# Patient Record
Sex: Female | Born: 1960 | Race: Black or African American | Hispanic: No | Marital: Married | Smoking: Never smoker
Health system: Southern US, Community
[De-identification: ages and names within clinical notes are randomized; demographics above are authoritative.]

## PROBLEM LIST (undated history)

## (undated) DIAGNOSIS — R062 Wheezing: Secondary | ICD-10-CM

## (undated) DIAGNOSIS — T7840XA Allergy, unspecified, initial encounter: Secondary | ICD-10-CM

## (undated) DIAGNOSIS — M1990 Unspecified osteoarthritis, unspecified site: Secondary | ICD-10-CM

## (undated) DIAGNOSIS — E119 Type 2 diabetes mellitus without complications: Secondary | ICD-10-CM

## (undated) DIAGNOSIS — E785 Hyperlipidemia, unspecified: Secondary | ICD-10-CM

## (undated) DIAGNOSIS — I10 Essential (primary) hypertension: Secondary | ICD-10-CM

## (undated) DIAGNOSIS — M797 Fibromyalgia: Secondary | ICD-10-CM

## (undated) DIAGNOSIS — K219 Gastro-esophageal reflux disease without esophagitis: Secondary | ICD-10-CM

## (undated) HISTORY — DX: Hyperlipidemia, unspecified: E78.5

## (undated) HISTORY — DX: Unspecified osteoarthritis, unspecified site: M19.90

## (undated) HISTORY — DX: Allergy, unspecified, initial encounter: T78.40XA

## (undated) HISTORY — DX: Wheezing: R06.2

## (undated) HISTORY — DX: Fibromyalgia: M79.7

## (undated) HISTORY — PX: POLYPECTOMY: SHX149

## (undated) HISTORY — DX: Gastro-esophageal reflux disease without esophagitis: K21.9

## (undated) HISTORY — PX: TONSILLECTOMY AND ADENOIDECTOMY: SHX28

## (undated) HISTORY — DX: Essential (primary) hypertension: I10

---

## 1998-10-23 ENCOUNTER — Encounter (INDEPENDENT_AMBULATORY_CARE_PROVIDER_SITE_OTHER): Payer: Self-pay | Admitting: *Deleted

## 1998-10-23 ENCOUNTER — Other Ambulatory Visit: Admission: RE | Admit: 1998-10-23 | Discharge: 1998-10-23 | Payer: Self-pay | Admitting: Gynecology

## 1998-11-18 ENCOUNTER — Encounter: Payer: Self-pay | Admitting: Family Medicine

## 1998-11-18 ENCOUNTER — Ambulatory Visit (HOSPITAL_COMMUNITY): Admission: RE | Admit: 1998-11-18 | Discharge: 1998-11-18 | Payer: Self-pay | Admitting: Family Medicine

## 1999-06-03 ENCOUNTER — Encounter: Payer: Self-pay | Admitting: Gynecology

## 1999-06-03 ENCOUNTER — Encounter: Admission: RE | Admit: 1999-06-03 | Discharge: 1999-06-03 | Payer: Self-pay | Admitting: Gynecology

## 1999-06-13 ENCOUNTER — Encounter: Admission: RE | Admit: 1999-06-13 | Discharge: 1999-06-13 | Payer: Self-pay | Admitting: Gynecology

## 1999-06-13 ENCOUNTER — Encounter: Payer: Self-pay | Admitting: Gynecology

## 2000-03-03 ENCOUNTER — Other Ambulatory Visit: Admission: RE | Admit: 2000-03-03 | Discharge: 2000-03-03 | Payer: Self-pay | Admitting: Gynecology

## 2000-08-12 ENCOUNTER — Encounter: Payer: Self-pay | Admitting: Gynecology

## 2000-08-12 ENCOUNTER — Encounter: Admission: RE | Admit: 2000-08-12 | Discharge: 2000-08-12 | Payer: Self-pay | Admitting: Gynecology

## 2001-11-16 ENCOUNTER — Encounter: Admission: RE | Admit: 2001-11-16 | Discharge: 2001-11-16 | Payer: Self-pay | Admitting: Internal Medicine

## 2001-11-16 ENCOUNTER — Encounter: Payer: Self-pay | Admitting: Internal Medicine

## 2002-11-21 ENCOUNTER — Encounter: Admission: RE | Admit: 2002-11-21 | Discharge: 2002-11-21 | Payer: Self-pay | Admitting: Internal Medicine

## 2002-11-21 ENCOUNTER — Encounter: Payer: Self-pay | Admitting: Internal Medicine

## 2003-05-24 ENCOUNTER — Other Ambulatory Visit: Admission: RE | Admit: 2003-05-24 | Discharge: 2003-05-24 | Payer: Self-pay | Admitting: Gynecology

## 2003-11-22 ENCOUNTER — Encounter: Admission: RE | Admit: 2003-11-22 | Discharge: 2003-11-22 | Payer: Self-pay | Admitting: Gynecology

## 2004-04-20 HISTORY — PX: COLONOSCOPY W/ POLYPECTOMY: SHX1380

## 2004-08-05 ENCOUNTER — Other Ambulatory Visit: Admission: RE | Admit: 2004-08-05 | Discharge: 2004-08-05 | Payer: Self-pay | Admitting: Gynecology

## 2005-01-21 ENCOUNTER — Ambulatory Visit: Payer: Self-pay | Admitting: Internal Medicine

## 2005-01-27 ENCOUNTER — Encounter: Admission: RE | Admit: 2005-01-27 | Discharge: 2005-01-27 | Payer: Self-pay | Admitting: Internal Medicine

## 2005-01-30 ENCOUNTER — Ambulatory Visit: Payer: Self-pay | Admitting: Internal Medicine

## 2005-01-30 ENCOUNTER — Encounter (INDEPENDENT_AMBULATORY_CARE_PROVIDER_SITE_OTHER): Payer: Self-pay | Admitting: Specialist

## 2005-03-09 ENCOUNTER — Ambulatory Visit: Payer: Self-pay | Admitting: Internal Medicine

## 2005-08-11 ENCOUNTER — Other Ambulatory Visit: Admission: RE | Admit: 2005-08-11 | Discharge: 2005-08-11 | Payer: Self-pay | Admitting: Gynecology

## 2006-02-02 ENCOUNTER — Encounter: Admission: RE | Admit: 2006-02-02 | Discharge: 2006-02-02 | Payer: Self-pay | Admitting: Gynecology

## 2006-02-19 ENCOUNTER — Encounter: Admission: RE | Admit: 2006-02-19 | Discharge: 2006-05-20 | Payer: Self-pay | Admitting: Gynecology

## 2006-08-17 ENCOUNTER — Other Ambulatory Visit: Admission: RE | Admit: 2006-08-17 | Discharge: 2006-08-17 | Payer: Self-pay | Admitting: Gynecology

## 2007-02-10 ENCOUNTER — Encounter: Admission: RE | Admit: 2007-02-10 | Discharge: 2007-02-10 | Payer: Self-pay | Admitting: Gynecology

## 2007-02-22 ENCOUNTER — Other Ambulatory Visit: Admission: RE | Admit: 2007-02-22 | Discharge: 2007-02-22 | Payer: Self-pay | Admitting: Gynecology

## 2007-08-30 ENCOUNTER — Other Ambulatory Visit: Admission: RE | Admit: 2007-08-30 | Discharge: 2007-08-30 | Payer: Self-pay | Admitting: Gynecology

## 2007-08-30 ENCOUNTER — Encounter (INDEPENDENT_AMBULATORY_CARE_PROVIDER_SITE_OTHER): Payer: Self-pay | Admitting: *Deleted

## 2007-08-30 ENCOUNTER — Encounter: Payer: Self-pay | Admitting: Family Medicine

## 2007-08-30 LAB — CONVERTED CEMR LAB
Cholesterol: 205 mg/dL
HDL: 64 mg/dL
LDL Cholesterol: 126 mg/dL
Triglycerides: 77 mg/dL

## 2008-02-14 ENCOUNTER — Encounter: Admission: RE | Admit: 2008-02-14 | Discharge: 2008-02-14 | Payer: Self-pay | Admitting: Gynecology

## 2008-05-10 ENCOUNTER — Encounter: Admission: RE | Admit: 2008-05-10 | Discharge: 2008-05-10 | Payer: Self-pay | Admitting: Interventional Radiology

## 2008-07-23 ENCOUNTER — Emergency Department (HOSPITAL_COMMUNITY): Admission: EM | Admit: 2008-07-23 | Discharge: 2008-07-24 | Payer: Self-pay | Admitting: Emergency Medicine

## 2008-07-26 ENCOUNTER — Ambulatory Visit: Payer: Self-pay | Admitting: Family Medicine

## 2008-07-26 DIAGNOSIS — M797 Fibromyalgia: Secondary | ICD-10-CM | POA: Insufficient documentation

## 2008-07-26 DIAGNOSIS — R209 Unspecified disturbances of skin sensation: Secondary | ICD-10-CM | POA: Insufficient documentation

## 2008-07-26 DIAGNOSIS — R55 Syncope and collapse: Secondary | ICD-10-CM | POA: Insufficient documentation

## 2008-07-30 ENCOUNTER — Encounter (INDEPENDENT_AMBULATORY_CARE_PROVIDER_SITE_OTHER): Payer: Self-pay | Admitting: *Deleted

## 2008-07-30 LAB — CONVERTED CEMR LAB
Folate: 18.4 ng/mL
HCT: 38.2 % (ref 36.0–46.0)
Hemoglobin: 13 g/dL (ref 12.0–15.0)
MCHC: 34.2 g/dL (ref 30.0–36.0)
MCV: 87.1 fL (ref 78.0–100.0)
Platelets: 309 10*3/uL (ref 150.0–400.0)
RBC: 4.39 M/uL (ref 3.87–5.11)
RDW: 13 % (ref 11.5–14.6)
TSH: 1.13 microintl units/mL (ref 0.35–5.50)
Vitamin B-12: 1067 pg/mL — ABNORMAL HIGH (ref 211–911)
WBC: 6.5 10*3/uL (ref 4.5–10.5)

## 2008-08-01 ENCOUNTER — Encounter (INDEPENDENT_AMBULATORY_CARE_PROVIDER_SITE_OTHER): Payer: Self-pay | Admitting: *Deleted

## 2008-08-10 ENCOUNTER — Encounter (INDEPENDENT_AMBULATORY_CARE_PROVIDER_SITE_OTHER): Payer: Self-pay | Admitting: *Deleted

## 2008-09-04 ENCOUNTER — Encounter: Payer: Self-pay | Admitting: Gynecology

## 2008-09-04 ENCOUNTER — Other Ambulatory Visit: Admission: RE | Admit: 2008-09-04 | Discharge: 2008-09-04 | Payer: Self-pay | Admitting: Gynecology

## 2008-09-04 ENCOUNTER — Encounter: Payer: Self-pay | Admitting: Family Medicine

## 2008-09-04 ENCOUNTER — Ambulatory Visit: Payer: Self-pay | Admitting: Gynecology

## 2008-09-07 ENCOUNTER — Encounter (INDEPENDENT_AMBULATORY_CARE_PROVIDER_SITE_OTHER): Payer: Self-pay | Admitting: *Deleted

## 2008-09-10 ENCOUNTER — Encounter: Payer: Self-pay | Admitting: Family Medicine

## 2008-09-13 ENCOUNTER — Ambulatory Visit: Payer: Self-pay | Admitting: Gynecology

## 2009-02-14 ENCOUNTER — Encounter: Admission: RE | Admit: 2009-02-14 | Discharge: 2009-02-14 | Payer: Self-pay | Admitting: Family Medicine

## 2009-09-19 ENCOUNTER — Ambulatory Visit: Payer: Self-pay | Admitting: Gynecology

## 2009-09-19 ENCOUNTER — Other Ambulatory Visit: Admission: RE | Admit: 2009-09-19 | Discharge: 2009-09-19 | Payer: Self-pay | Admitting: Gynecology

## 2009-09-26 ENCOUNTER — Ambulatory Visit: Payer: Self-pay | Admitting: Gynecology

## 2009-11-05 ENCOUNTER — Ambulatory Visit: Payer: Self-pay | Admitting: Gynecology

## 2010-01-06 ENCOUNTER — Encounter (INDEPENDENT_AMBULATORY_CARE_PROVIDER_SITE_OTHER): Payer: Self-pay | Admitting: *Deleted

## 2010-01-21 ENCOUNTER — Encounter: Admission: RE | Admit: 2010-01-21 | Discharge: 2010-01-21 | Payer: Self-pay | Admitting: Orthopedic Surgery

## 2010-02-17 ENCOUNTER — Encounter: Admission: RE | Admit: 2010-02-17 | Discharge: 2010-02-17 | Payer: Self-pay | Admitting: Gynecology

## 2010-02-17 ENCOUNTER — Encounter (INDEPENDENT_AMBULATORY_CARE_PROVIDER_SITE_OTHER): Payer: Self-pay | Admitting: *Deleted

## 2010-02-20 ENCOUNTER — Ambulatory Visit: Payer: Self-pay | Admitting: Internal Medicine

## 2010-03-04 ENCOUNTER — Ambulatory Visit: Payer: Self-pay | Admitting: Family Medicine

## 2010-03-04 DIAGNOSIS — J019 Acute sinusitis, unspecified: Secondary | ICD-10-CM | POA: Insufficient documentation

## 2010-03-06 ENCOUNTER — Ambulatory Visit: Payer: Self-pay | Admitting: Internal Medicine

## 2010-03-07 ENCOUNTER — Encounter: Payer: Self-pay | Admitting: Internal Medicine

## 2010-05-20 NOTE — Miscellaneous (Signed)
Summary: [facility] previsit  Clinical Lists Changes  Medications: Added new medication of DULCOLAX 5 MG  TBEC (BISACODYL) Day before procedure take 2 at 3pm and 2 at 8pm. - Signed Added new medication of METOCLOPRAMIDE HCL 10 MG  TABS (METOCLOPRAMIDE HCL) As per prep instructions. - Signed Added new medication of MIRALAX   POWD (POLYETHYLENE GLYCOL 3350) As per prep  instructions. - Signed Rx of DULCOLAX 5 MG  TBEC (BISACODYL) Day before procedure take 2 at 3pm and 2 at 8pm.;  #4 x 0;  Signed;  Entered by: Karl Bales RN;  Authorized by: Hart Carwin MD;  Method used: Electronically to [facility]  [address] [id number] [address] [place], [place]  [address], Ph: [phone] or [phone], Fax: [phone] Rx of METOCLOPRAMIDE HCL 10 MG  TABS (METOCLOPRAMIDE HCL) As per prep instructions.;  #2 x 0;  Signed;  Entered by: Karl Bales RN;  Authorized by: Hart Carwin MD;  Method used: Electronically to [facility]  [address] [id number] [address] [place], [place]  [address], Ph: [phone] or [phone], Fax: [phone] Rx of MIRALAX   POWD (POLYETHYLENE GLYCOL 3350) As per prep  instructions.;  #255gm x 0;  Signed;  Entered by: Karl Bales RN;  Authorized by: Hart Carwin MD;  Method used: Electronically to [facility]  [address] [facility] [address] [place], [place]  [address], Ph: [phone] or [phone], Fax: [phone] Observations: Added new observation of NKA: T (02/20/2010 8:03)    Prescriptions: MIRALAX   POWD (POLYETHYLENE GLYCOL 3350) As per prep  instructions.  #255gm x 0   Entered by:   Karl Bales RN   Authorized by:   Hart Carwin MD   Signed by:   Karl Bales RN on 02/20/2010   Method used:   [facility]ronically to        [facility] [id number] ([facility]ail)       [address]       [place], [place]  [address]       Ph: [phone] or [phone]       Fax: [phone]   RxID:    [id number] METOCLOPRAMIDE HCL 10 MG  TABS (METOCLOPRAMIDE HCL) As per prep instructions.  #2 x 0   Entered by:   Karl Bales RN   Authorized by:   Hart Carwin MD   Signed by:   Karl Bales RN on 02/20/2010   Method used:   [facility]ronically to        [facility] [address]       [address]       [place], [place]  [address]       Ph: [phone] or [phone]       Fax: [phone]   RxID:   [id number] DULCOLAX 5 MG  TBEC (BISACODYL) Day before procedure take 2 at 3pm and 2 at 8pm.  #4 x 0   Entered by:   Karl Bales RN   Authorized by:   Hart Carwin MD   Signed by:   Karl Bales RN on 02/20/2010   Method used:   [facility]ronically to        [facility]       [address]       [place], [place]  [address]  Ph: [phone] or [phone]       Fax: [phone]   RxID:   [id number]

## 2010-05-20 NOTE — Letter (Signed)
Summary: Results Follow up Letter  [facility]  [address] [place], [place] [address]   Phone: [phone]  Fax: [phone]    07/30/2008 MRN: [id number]  Courtney Guzman [address] [place], [place]  [address]  Dear [name],  The following are the results of your recent test(s):  Test         Result    Pap Smear:        Normal _____  Not Normal _____ Comments: ______________________________________________________ Cholesterol: LDL(Bad cholesterol):         Your goal is less than:         HDL (Good cholesterol):       Your goal is more than: Comments:  ______________________________________________________ Mammogram:        Normal _____  Not Normal _____ Comments:  ___________________________________________________________________ Hemoccult:        Normal _____  Not normal _______ Comments:    _____________________________________________________________________ Other Tests: PLEASE SEE COPY OF LABS FROM 07/26/08- labs normal- no obvious cause for numbness/tingling.    We routinely do not discuss normal results over the telephone.  If you desire a copy of the results, or you have any questions about this information we can discuss them at your next office visit.   Sincerely,

## 2010-05-20 NOTE — Letter (Signed)
Summary: [facility] Instructions  [facility]  [address] [place], [place] [address]   Phone: [phone]  Fax: [phone]       Courtney Guzman    [birth year]    MRN: [id number]       Procedure Day /Date:  Thursday 03/06/2010     Arrival Time: 7:30 am     Procedure Time: 8:30 am     Location of Procedure:                    _x_  [facility] (4th Floor)   PREPARATION FOR COLONOSCOPY WITH MIRALAX  Starting 5 days prior to your procedure Saturday 11/12 do not eat nuts, seeds, popcorn, corn, beans, peas,  salads, or any raw vegetables.  Do not take any fiber supplements (e.g. Metamucil, Citrucel, and Benefiber). ____________________________________________________________________________________________________   THE DAY BEFORE YOUR PROCEDURE         DATE: Wednesday 11/16  1   Drink clear liquids the entire day-NO SOLID FOOD  2   Do not drink anything colored red or purple.  Avoid juices with pulp.  No orange juice.  3   Drink at least 64 oz. (8 glasses) of fluid/clear liquids during the day to prevent dehydration and help the prep work efficiently.  CLEAR LIQUIDS INCLUDE: Water Jello Ice Popsicles Tea (sugar ok, no milk/cream) Powdered fruit flavored drinks Coffee (sugar ok, no milk/cream) Gatorade Juice: apple, white grape, white cranberry  Lemonade Clear bullion, consomm, broth Carbonated beverages (any kind) Strained chicken noodle soup Hard Candy  4   Mix the entire bottle of Miralax with 64 oz. of Gatorade/Powerade in the morning and put in the refrigerator to chill.  5   At 3:00 pm take 2 Dulcolax/Bisacodyl tablets.  6   At 4:30 pm take one Reglan/Metoclopramide tablet.  7  Starting at 5:00 pm drink one 8 oz glass of the Miralax mixture every 15-20 minutes until you have finished drinking the entire 64 oz.  You should finish drinking prep around 7:30 or 8:00 pm.  8   If you are nauseated, you may take the 2nd Reglan/Metoclopramide  tablet at 6:30 pm.        9    At 8:00 pm take 2 more DULCOLAX/Bisacodyl tablets.     THE DAY OF YOUR PROCEDURE      DATE:  Thursday 11/17  You may drink clear liquids until 6:30 am  (2 HOURS BEFORE PROCEDURE).   MEDICATION INSTRUCTIONS  Unless otherwise instructed, you should take regular prescription medications with a small sip of water as early as possible the morning of your procedure.           OTHER INSTRUCTIONS  You will need a responsible adult at least 51 years of age to accompany you and drive you home.   This person must remain in the waiting room during your procedure.  Wear loose fitting clothing that is easily removed.  Leave jewelry and other valuables at home.  However, you may wish to bring a book to read or an iPod/MP3 player to listen to music as you wait for your procedure to start.  Remove all body piercing jewelry and leave at home.  Total time from sign-in until discharge is approximately 2-3 hours.  You should go home directly after your procedure and rest.  You can resume normal activities the day after your procedure.  The day of your procedure you should not:   Drive   Make  legal decisions   Operate machinery   Drink alcohol   Return to work  You will receive specific instructions about eating, activities and medications before you leave.   The above instructions have been reviewed and explained to me by  Karl Bales RN  February 20, 2010 8:24 AM     I fully understand and can verbalize these instructions _____________________________ Date _______

## 2010-05-20 NOTE — Letter (Signed)
Summary: Historic Patient File  Historic Patient File   Imported By: Freddy Jaksch 09/10/2008 12:30:28  _____________________________________________________________________  External Attachment:    Type:   Image     Comment:   HIPPA

## 2010-05-20 NOTE — Procedures (Signed)
Summary: Colonoscopy  Patient: Courtney Guzman Note: All result statuses are Final unless otherwise noted.  Tests: (1) Colonoscopy (COL)   COL Colonoscopy           DONE (C)     [facility]     [address]     [place], [place]  [address]           COLONOSCOPY PROCEDURE REPORT           PATIENT:  Courtney, Guzman  MR#:  [id number]     BIRTHDATE:  1960-08-30, 49 yrs. old  GENDER:  female     ENDOSCOPIST:  Hedwig Morton. Juanda Chance, MD     REF. BY:  Helane Rima. Beverely Low, M.D.     PROCEDURE DATE:  03/06/2010     PROCEDURE:  Colonoscopy 19147     ASA CLASS:  Class I     INDICATIONS:  history of pre-cancerous (adenomatous) colon polyps     aden. polyp 2006, F hx of colon Ca in a GParent     MEDICATIONS:   Versed 10 mg, Fentanyl 100 mcg           DESCRIPTION OF PROCEDURE:   After the risks benefits and     alternatives of the procedure were thoroughly explained, informed     consent was obtained.  Digital rectal exam was performed and     revealed no rectal masses.   The LB 180AL [id number] endoscope was     introduced through the anus and advanced to the cecum, which was     identified by both the appendix and ileocecal valve, without     limitations.  The quality of the prep was adequate, using MiraLax.     The instrument was then slowly withdrawn as the colon was fully     examined.     <<PROCEDUREIMAGES>>           FINDINGS:  A diminutive polyp was found. 2 mm sessile polyp in the     right colon The polyp was removed using cold biopsy forceps (see     image5 and image6).  This was otherwise a normal examination of     the colon (see image8, image4, image3, image2, and image1).     Retroflexed views in the rectum revealed no abnormalities.    The     scope was then withdrawn from the patient and the procedure     completed.           COMPLICATIONS:  None     ENDOSCOPIC IMPRESSION:     1) Diminutive polyp     2) Otherwise normal examination     RECOMMENDATIONS:     1) Await  pathology results     REPEAT EXAM:  In 10 year(s) for.           ______________________________     Hedwig Morton. Juanda Chance, MD           CC:           n.     REVISED:  03/06/2010 08:49 AM     eSIGNED:   Hedwig Morton. Brodie at 03/06/2010 08:49 AM           Lynelle Doctor, [id number]  Note: An exclamation mark (!) indicates a result that was not dispersed into the flowsheet. Document Creation Date: 03/06/2010 8:49 AM _______________________________________________________________________  (1) Order result status: Final Collection or observation date-time: 03/06/2010 08:33 Requested date-time:  Receipt date-time:  Reported date-time:  Referring Physician:   Ordering Physician: Lina Sar [id number]) Specimen Source:  Source: Launa Grill Order Number: [id number] Lab site:   Appended Document: Colonoscopy     Procedures Next Due Date:    Colonoscopy: 03/2015

## 2010-05-20 NOTE — Letter (Signed)
Summary: Primary Care Consult Scheduled Letter  [facility]  [address] [place], [place] [address]   Phone: [phone]  Fax: [phone]      08/10/2008 MRN: [id number]  KIYARA BOUFFARD [address] [place], [place]  [address]    Dear [name],      We have scheduled an appointment for you.  At the recommendation of Dr. Neena Rhymes, we have scheduled you a consult with Dr. Anne Hahn with [facility] on 09-13-08 at 10:00am.  Their address is [address], [place] [address]. The office phone number is [phone].  If this appointment day and time is not convenient for you, please feel free to call the office of the doctor you are being referred to at the number listed above and reschedule the appointment.     It is important for you to keep your scheduled appointments. We are here to make sure you are given good patient care. If you have questions or you have made changes to your appointment, please notify us at  [phone], ask for Renee.    Thank you,  Patient Care Coordinator [place] [facility]

## 2010-05-20 NOTE — Letter (Signed)
Summary: Pre Visit Letter Revised  [facility]  [address] [place], [place] [address]   Phone: [phone]  Fax: [phone]        01/06/2010 MRN: [id number] Courtney Guzman [address] [place], [place]  [address]             Procedure Date:  03-06-10   Welcome to the Gastroenterology Division at [facility].    You are scheduled to see a nurse for your pre-procedure visit on 02-20-10 at 8:00a.m. on the 3rd floor at [facility], [address]  We ask that you try to arrive at our office 15 minutes prior to your appointment time to allow for check-in.  Please take a minute to review the attached form.  If you answer "Yes" to one or more of the questions on the first page, we ask that you call the person listed at your earliest opportunity.  If you answer "No" to all of the questions, please complete the rest of the form and bring it to your appointment.    Your nurse visit will consist of discussing your medical and surgical history, your immediate family medical history, and your medications.   If you are unable to list all of your medications on the form, please bring the medication bottles to your appointment and we will list them.  We will need to be aware of both prescribed and over the counter drugs.  We will need to know exact dosage information as well.    Please be prepared to read and sign documents such as consent forms, a financial agreement, and acknowledgement forms.  If necessary, and with your consent, a friend or relative is welcome to sit-in on the nurse visit with you.  Please bring your insurance card so that we may make a copy of it.  If your insurance requires a referral to see a specialist, please bring your referral form from your primary care physician.  No co-pay is required for this nurse visit.     If you cannot keep your appointment, please call [phone] to cancel or reschedule prior to your appointment date.  This allows  [place] the opportunity to schedule an appointment for another patient in need of care.    Thank you for choosing [facility] for your medical needs.  We appreciate the opportunity to care for you.  Please visit [place] at our website  to learn more about our practice.  Sincerely, The Gastroenterology Division

## 2010-05-20 NOTE — Assessment & Plan Note (Signed)
Summary: new to establish hosp follow-up vertigo//fd   Vital Signs:  Patient profile:   50 year old female Height:      60.75 inches Weight:      166.4 pounds BMI:     31.81 Pulse rate:   72 / minute Resp:     16 per minute BP sitting:   116 / 74  (left arm)  Vitals Entered By: Doristine Devoid (July 26, 2008 3:37 PM) CC: hosp f/u up for dizziness    History of Present Illness: 50 yo woman here today to establish care.  hasn't seen PCP in 3-4 yrs.  follows w/ GYN regularly, eye doctor regularly.  on Monday 4/5 at 9:30pm pt 'went totally blank for 10-15 minutes, lost my train of thought.  tried to stand up- fell back down'.  pt also reports dizziness, nausea.  pt had worked out, took [employer], had not eaten anything except for few peanuts that day.  went to ER.  they evaluated pt and they suggested pt f/u w/ neurologist.  pt needs PCP in order to get referral.  pt w/ burning in R arm and leg.  pt has discussed sxs w/ Dr Corliss Skains (fibro) in 2/10- MRI of head WNL, offered neuro referral- pt declined.  pt reports burning is constant w/ intermittant flares.  sxs present for 2+ yrs.  pt currently on neurontin- 4 months- no relief.    Preventive Screening-Counseling & Management     Alcohol drinks/day: 0     Smoking Status: never     Does Patient Exercise: yes     Type of exercise: aerobics, walking, jogging     Exercise (avg: min/session): 30-60     Times/week: 4      Sexual History:  currently monogamous.    Current Medications (verified): 1)  Gabapentin 100 Mg Caps (Gabapentin) .... Take One Tablet Two Times A Day 2)  Topamax 25 Mg Tabs (Topiramate) .... Take One Tablet Daily 3)  Cyclobenzaprine  Allergies (verified): No Known Drug Allergies  Past History:  Past Medical History:    Fibromyalgia  Past Surgical History:    Caesarean section    Tubal ligation    Hand surgery    Foot surgery  Past History:  Care Management:    Gynecology: Dr. Lily Peer    Rheumatology:  Dr. Corliss Skains    Gastroenterology: Dr. Juanda Chance  Family History:    CAD-no    HTN-no    DM-sister, paternal grandmother    STROKE-no    COLON CA-maternal grandmother    BREAST CA-no      Social History:    lives w/ husband and daughter.  one daughter is grown and in [employer].    Smoking Status:  never    Does Patient Exercise:  yes    Sexual History:  currently monogamous  Review of Systems General:  Complains of fatigue; denies chills and fever. Eyes:  Denies blurring and double vision. ENT:  Denies ringing in ears. CV:  Complains of palpitations; denies chest pain or discomfort, lightheadness, and shortness of breath with exertion. GI:  Denies nausea and vomiting. Neuro:  Complains of numbness and tingling; denies headaches and tremors.  Physical Exam  General:  Well-developed,well-nourished,in no acute distress; alert,appropriate and cooperative throughout examination Head:  Normocephalic and atraumatic without obvious abnormalities. No apparent alopecia or balding. Eyes:  No corneal or conjunctival inflammation noted. EOMI. Perrla. Funduscopic exam benign, without hemorrhages, exudates or papilledema. Vision grossly normal. Ears:  External ear exam  shows no significant lesions or deformities.  Otoscopic examination reveals clear canals, tympanic membranes are intact bilaterally without bulging, retraction, inflammation or discharge. Hearing is grossly normal bilaterally. Mouth:  Oral mucosa and oropharynx without lesions or exudates.  Teeth in good repair. Neck:  No deformities, masses, or tenderness noted. Lungs:  Normal respiratory effort, chest expands symmetrically. Lungs are clear to auscultation, no crackles or wheezes. Heart:  Normal rate and regular rhythm. S1 and S2 normal without gallop, murmur, click, rub or other extra sounds. Neurologic:  alert & oriented X3, cranial nerves II-XII intact, strength normal in all extremities, gait normal, DTRs symmetrical and normal,  finger-to-nose normal, and Romberg negative.  normal tandem gait. Cervical Nodes:  No lymphadenopathy noted Psych:  Cognition and judgment appear intact. Alert and cooperative with normal attention span and concentration. No apparent delusions, illusions, hallucinations   Impression & Recommendations:  Problem # 1:  SYNCOPE (ICD-780.2) Assessment New pt w/ episode of syncope/near syncope on Monday 4/5.  likely multifactorial- low blood sugar from not eating, s/p exercise and hot shower w/ vasodilating effects and then rapid change in position when pt attempted to stand.  likely a vagal episode.  pt w/ normal head CT in ER, labs.  reviewed discharge summary.  normal MRI w/ Dr Corliss Skains.  pt was requesting neuro referral for paresthesias anyway but will now refer for both.  reviewed supportive care and red flags that should prompt return.  Pt expresses understanding and is in agreement w/ this plan. Orders: Neurology Referral (Neuro)  Problem # 2:  PARESTHESIA (ICD-782.0) Assessment: New pt w/ ongoing paresthesias.  will do labs to determine if underlying cause.  will refer to neuro as pt had discussed w/ Dr Loni Beckwith in past. Orders: Venipuncture (32440) TLB-CBC Platelet - w/Differential (85025-CBCD) TLB-B12 + Folate Pnl (10272_53664-Q03/KVQ) TLB-TSH (Thyroid Stimulating Hormone) (25956-LOV) Neurology Referral (Neuro)  Problem # 3:  FIBROMYALGIA (ICD-729.1) Assessment: New follows w/ Dr Corliss Skains.  will not change medications or interfere w/ tx plan.  encouraged pt to keep lines of communication open w/ rheum.  Complete Medication List: 1)  Gabapentin 100 Mg Caps (Gabapentin) .... Take one tablet two times a day 2)  Topamax 25 Mg Tabs (Topiramate) .... Take one tablet daily 3)  Cyclobenzaprine  4)  Ventolin Hfa 108 (90 Base) Mcg/act Aers (Albuterol sulfate) .... 2 puffs q4 prn  Patient Instructions: 1)  Someone will call you with your Neuro appt 2)  We will notify you of your lab  results 3)  I think your episode on Monday night was a combo of low blood sugar, low blood pressure from the exercise and hot shower.  Make sure you eat regularly, drink plenty of fluids 4)  If this happens again- please call 5)  Have Dr Lily Peer send me a copy of your routine labs 6)  Follow up with Dr Corliss Skains as scheduled 7)  We will determine when you need to follow up based on everyone's report 8)  Hang in there!  Appended Document: new to establish hosp follow-up vertigo//fd    Clinical Lists Changes  Orders: Added new Service order of EKG w/ Interpretation (93000) - Signed

## 2010-05-20 NOTE — Miscellaneous (Signed)
  Clinical Lists Changes  Observations: Added new observation of TRIGLYC TOT: 77 mg/dL (03/50/0938 1:82) Added new observation of LDL: 126 mg/dL (99/37/1696 7:89) Added new observation of HDL: 64 mg/dL (38/01/1750 0:25) Added new observation of CHOLESTEROL: 205 mg/dL (85/27/7824 2:35)

## 2010-05-20 NOTE — Letter (Signed)
Summary: New Patient Letter  [facility]  [address] [place], [place] [address]   Phone: [phone]  Fax: [phone]       08/01/2008 MRN: [id number]  [facility] [address] [place], [place]  [address]  Dear [name],   Welcome to [facility] and thank you for choosing [place] as your Primary Care Providers. Enclosed you will find information about our practice that we hope you find helpful. We have also enclosed forms to be filled out prior to your visit. This will provide [place] with the necessary information and facilitate your being seen in a timely manner. If you have any questions, please call us at:         and we will be happy to assist you. We look forward to seeing you at your scheduled appointment time.  Appointment   08-03-08 @3 :00            with Dr.    Neena Rhymes             Sincerely,  Primary Health Care Team  Please arrive 15 minutes early for your first appointment and bring your insurance card. Co-pay is required at the time of your visit.  *****Please call the office if you are not able to keep this appointment. There is a charge of $50.00 if any appointment is not cancelled or rescheduled within 24 hours.

## 2010-05-20 NOTE — Assessment & Plan Note (Signed)
Summary: SORE THROAT,COUGH,?SINUS ISSUES//FD   Vital Signs:  Patient profile:   50 year old female Height:      60.75 inches Weight:      163 pounds BMI:     31.16 O2 Sat:      98 % on Room air Temp:     98.0 degrees F oral Pulse rate:   88 / minute BP sitting:   120 / 80  (right arm)  Vitals Entered By: Doristine Devoid CMA (March 04, 2010 9:51 AM)  O2 Flow:  Room air CC: sinus congestion and cough along w/ HA and pressure x1 wk    History of Present Illness: 50 yo woman here today for ? sinus infxn.  sxs started 10 days ago w/ sore throat.  1 week ago developed nasal congestion.  no relief w/ OTC meds.  + hoarseness, facial pain.  + cough at night- productive.  no fevers.  no known sick contacts.  denies ear pain.  Current Medications (verified): 1)  Gabapentin 100 Mg Caps (Gabapentin) .... Take One Tablet Two Times A Day 2)  Topamax 25 Mg Tabs (Topiramate) .... Take One Tablet Daily 3)  Cyclobenzaprine 4)  Ventolin Hfa 108 (90 Base) Mcg/act Aers (Albuterol Sulfate) .... 2 Puffs Q4 Prn  Allergies (verified): No Known Drug Allergies  Review of Systems      See HPI  Physical Exam  General:  Well-developed,well-nourished,in no acute distress; alert,appropriate and cooperative throughout examination Head:  NCAT, + TTP over frontal and maxillary sinuses Eyes:  no injxn or inflammation Ears:  External ear exam shows no significant lesions or deformities.  Otoscopic examination reveals clear canals, tympanic membranes are intact bilaterally without bulging, retraction, inflammation or discharge. Hearing is grossly normal bilaterally. Nose:  edematous turbinates Mouth:  + PND Neck:  No deformities, masses, or tenderness noted. Lungs:  Normal respiratory effort, chest expands symmetrically. Lungs are clear to auscultation, no crackles or wheezes. Heart:  Normal rate and regular rhythm. S1 and S2 normal without gallop, murmur, click, rub or other extra sounds.   Impression &  Recommendations:  Problem # 1:  SINUSITIS - ACUTE-NOS (ICD-461.9) Assessment New sxs and PE consistent w/ sinus infxn.  start amox.  reviewed supportive care and red flags that should prompt return.  Pt expresses understanding and is in agreement w/ this plan. Her updated medication list for this problem includes:    Amoxicillin 500 Mg Tabs (Amoxicillin) .Marland Kitchen... 2 tabs by mouth two times a day x10 days    Cheratussin Ac 100-10 Mg/77ml Syrp (Guaifenesin-codeine) .Marland Kitchen... 1-2 tsps q4-6 as needed for cough.  will cause drowsiness  Complete Medication List: 1)  Gabapentin 100 Mg Caps (Gabapentin) .... Take one tablet two times a day 2)  Topamax 25 Mg Tabs (Topiramate) .... Take one tablet daily 3)  Cyclobenzaprine  4)  Ventolin Hfa 108 (90 Base) Mcg/act Aers (Albuterol sulfate) .... 2 puffs q4 prn 5)  Amoxicillin 500 Mg Tabs (Amoxicillin) .... 2 tabs by mouth two times a day x10 days 6)  Cheratussin Ac 100-10 Mg/83ml Syrp (Guaifenesin-codeine) .Marland Kitchen.. 1-2 tsps q4-6 as needed for cough.  will cause drowsiness  Patient Instructions: 1)  Follow up for your complete physical at your convenience after the New Year 2)  Take the Amoxicillin as directed for your sinus infection- take w/ food to avoid upset stomach 3)  Drink plenty of fluids 4)  Use the codeine cough syrup for nights and weekends- will cause drowsiness 5)  Use Robitussin or  Delsym for daytime cough 6)  Continue the Mucinex to thin your congestion 7)  Sudafed as needed for congestion 8)  Call with any questions or concerns 9)  Hang in there!!! Prescriptions: CHERATUSSIN AC 100-10 MG/5ML SYRP (GUAIFENESIN-CODEINE) 1-2 tsps Q4-6 as needed for cough.  will cause drowsiness  #150 x 0   Entered and Authorized by:   Neena Rhymes MD   Signed by:   Neena Rhymes MD on 03/04/2010   Method used:   Print then Give to Patient   RxID:   [id number] AMOXICILLIN 500 MG TABS (AMOXICILLIN) 2 tabs by mouth two times a day x10 days  #40 x 0    Entered and Authorized by:   Neena Rhymes MD   Signed by:   Neena Rhymes MD on 03/04/2010   Method used:   Print then Give to Patient   RxID:   [id number]    Orders Added: 1)  Est. Patient Level III [10932]

## 2010-05-20 NOTE — Letter (Signed)
Summary: Patient Notice- Polyp Results  [facility]  [address] [place], [place] [address]   Phone: [phone]  Fax: [phone]        March 07, 2010 MRN: [id number]    Courtney Guzman [address] [place], [place]  [address]    Dear [name],  I am pleased to inform you that the colon polyp(s) removed during your recent colonoscopy was (were) found to be benign (no cancer detected) upon pathologic examination.The polyp is adenomatous ( precancerous)  I recommend you have a repeat colonoscopy examination in 5_ years to look for recurrent polyps, as having colon polyps increases your risk for having recurrent polyps or even colon cancer in the future.  Should you develop new or worsening symptoms of abdominal pain, bowel habit changes or bleeding from the rectum or bowels, please schedule an evaluation with either your primary care physician or with me.  Additional information/recommendations:  _x_ No further action with gastroenterology is needed at this time. Please      follow-up with your primary care physician for your other healthcare      needs.  __ Please call [phone] to schedule a return visit to review your      situation.  __ Please keep your follow-up visit as already scheduled.  __ Continue treatment plan as outlined the day of your exam.  Please call us if you are having persistent problems or have questions about your condition that have not been fully answered at this time.  Sincerely,  Hart Carwin MD  This letter has been electronically signed by your physician.  Appended Document: Patient Notice- Polyp Results Letter mailed to patient 03/10/10.  Appended Document: Patient Notice- Polyp Results Letter mailed

## 2010-07-30 LAB — DIFFERENTIAL
Basophils Absolute: 0 10*3/uL (ref 0.0–0.1)
Basophils Relative: 1 % (ref 0–1)
Eosinophils Absolute: 0.1 10*3/uL (ref 0.0–0.7)
Eosinophils Relative: 2 % (ref 0–5)
Lymphocytes Relative: 31 % (ref 12–46)
Lymphs Abs: 2.7 10*3/uL (ref 0.7–4.0)
Monocytes Absolute: 0.7 10*3/uL (ref 0.1–1.0)
Monocytes Relative: 8 % (ref 3–12)
Neutro Abs: 5.2 10*3/uL (ref 1.7–7.7)
Neutrophils Relative %: 59 % (ref 43–77)

## 2010-07-30 LAB — POCT I-STAT, CHEM 8
BUN: 9 mg/dL (ref 6–23)
Calcium, Ion: 1.24 mmol/L (ref 1.12–1.32)
Chloride: 108 mEq/L (ref 96–112)
Creatinine, Ser: 0.9 mg/dL (ref 0.4–1.2)
Glucose, Bld: 142 mg/dL — ABNORMAL HIGH (ref 70–99)
HCT: 40 % (ref 36.0–46.0)
Hemoglobin: 13.6 g/dL (ref 12.0–15.0)
Potassium: 3.4 mEq/L — ABNORMAL LOW (ref 3.5–5.1)
Sodium: 142 mEq/L (ref 135–145)
TCO2: 22 mmol/L (ref 0–100)

## 2010-07-30 LAB — CBC
HCT: 38.2 % (ref 36.0–46.0)
Hemoglobin: 12.8 g/dL (ref 12.0–15.0)
MCHC: 33.4 g/dL (ref 30.0–36.0)
MCV: 89.4 fL (ref 78.0–100.0)
Platelets: 301 10*3/uL (ref 150–400)
RBC: 4.27 MIL/uL (ref 3.87–5.11)
RDW: 13.7 % (ref 11.5–15.5)
WBC: 8.8 10*3/uL (ref 4.0–10.5)

## 2010-07-30 LAB — SAMPLE TO BLOOD BANK

## 2011-01-30 ENCOUNTER — Other Ambulatory Visit: Payer: Self-pay | Admitting: Gynecology

## 2011-01-30 DIAGNOSIS — Z1231 Encounter for screening mammogram for malignant neoplasm of breast: Secondary | ICD-10-CM

## 2011-02-17 ENCOUNTER — Encounter: Payer: Self-pay | Admitting: Anesthesiology

## 2011-02-17 DIAGNOSIS — K219 Gastro-esophageal reflux disease without esophagitis: Secondary | ICD-10-CM | POA: Insufficient documentation

## 2011-02-19 ENCOUNTER — Ambulatory Visit
Admission: RE | Admit: 2011-02-19 | Discharge: 2011-02-19 | Disposition: A | Discharge status: Home / Self Care | Payer: 59 | Source: Ambulatory Visit | Attending: Gynecology | Admitting: Gynecology

## 2011-02-19 DIAGNOSIS — Z1231 Encounter for screening mammogram for malignant neoplasm of breast: Secondary | ICD-10-CM

## 2011-02-24 ENCOUNTER — Ambulatory Visit (INDEPENDENT_AMBULATORY_CARE_PROVIDER_SITE_OTHER): Payer: 59 | Admitting: Gynecology

## 2011-02-24 ENCOUNTER — Encounter: Payer: Self-pay | Admitting: Gynecology

## 2011-02-24 ENCOUNTER — Other Ambulatory Visit (HOSPITAL_COMMUNITY)
Admission: RE | Admit: 2011-02-24 | Discharge: 2011-02-24 | Disposition: A | Discharge status: Home / Self Care | Payer: 59 | Source: Ambulatory Visit | Attending: Gynecology | Admitting: Gynecology

## 2011-02-24 VITALS — BP 120/82 | Ht 61.5 in | Wt 167.0 lb

## 2011-02-24 DIAGNOSIS — Z1211 Encounter for screening for malignant neoplasm of colon: Secondary | ICD-10-CM

## 2011-02-24 DIAGNOSIS — R82998 Other abnormal findings in urine: Secondary | ICD-10-CM

## 2011-02-24 DIAGNOSIS — R5381 Other malaise: Secondary | ICD-10-CM

## 2011-02-24 DIAGNOSIS — R5383 Other fatigue: Secondary | ICD-10-CM

## 2011-02-24 DIAGNOSIS — Z01419 Encounter for gynecological examination (general) (routine) without abnormal findings: Secondary | ICD-10-CM

## 2011-02-24 DIAGNOSIS — Z780 Asymptomatic menopausal state: Secondary | ICD-10-CM

## 2011-02-24 DIAGNOSIS — Z833 Family history of diabetes mellitus: Secondary | ICD-10-CM

## 2011-02-24 DIAGNOSIS — R635 Abnormal weight gain: Secondary | ICD-10-CM

## 2011-02-24 LAB — COMPREHENSIVE METABOLIC PANEL
ALT: 19 U/L (ref 0–35)
AST: 27 U/L (ref 0–37)
Albumin: 4.7 g/dL (ref 3.5–5.2)
Alkaline Phosphatase: 95 U/L (ref 39–117)
BUN: 13 mg/dL (ref 6–23)
CO2: 29 mEq/L (ref 19–32)
Calcium: 10.3 mg/dL (ref 8.4–10.5)
Chloride: 103 mEq/L (ref 96–112)
Creat: 0.72 mg/dL (ref 0.50–1.10)
Glucose, Bld: 92 mg/dL (ref 70–99)
Potassium: 5 mEq/L (ref 3.5–5.3)
Sodium: 142 mEq/L (ref 135–145)
Total Bilirubin: 0.4 mg/dL (ref 0.3–1.2)
Total Protein: 7.4 g/dL (ref 6.0–8.3)

## 2011-02-24 LAB — POC HEMOCCULT BLD/STL (OFFICE/1-CARD/DIAGNOSTIC): Fecal Occult Blood, POC: NEGATIVE

## 2011-02-24 NOTE — Progress Notes (Signed)
Courtney Guzman 1961-03-10 [id number]   History:    50 y.o.  for annual exam with no complaints today. Review of her record indicates she had a normal mammogram November 1 of this year. Patient states she's doing her monthly self breast examination. She has fibromyalgia and has been followed by Dr. Corliss Skains. Her primary care physician is Dr. Beverely Low. Patient had a colonoscopy in 2011 by Dr. Dickie La with benign polyps detected. Patient has not had a baseline bone density study yet. She is taking calcium vitamin D once a day. She continues to have a BMI over 35. Patient is menopausal with minimal vasomotor symptoms.  Past medical history,surgical history, family history and social history were all reviewed and documented in the EPIC chart.  Gynecologic History Patient's last menstrual period was 02/24/2008. Contraception: none Last Pap: 2011. Results were: normal Last mammogram: 2012. Results were: normal  Obstetric History OB History    Grav Para Term Preterm Abortions TAB SAB Ect Mult Living   2 2 2       2      # Outc Date GA Lbr Len/2nd Wgt Sex Del Anes PTL Lv   1 TRM     F SVD   Yes   2 TRM     F CS   Yes       ROS:  Was performed and pertinent positives and negatives are included in the history.  Exam: chaperone present  BP 120/82  Ht 5' 1.5" (1.562 m)  Wt 167 lb (75.751 kg)  BMI 31.04 kg/m2  LMP 02/24/2008  Body mass index is 31.04 kg/(m^2).  General appearance : Well developed well nourished female. No acute distress HEENT: Neck supple, trachea midline, no carotid bruits, no thyroidmegaly Lungs: Clear to auscultation, no rhonchi or wheezes, or rib retractions  Heart: Regular rate and rhythm, no murmurs or gallops Breast:Examined in sitting and supine position were symmetrical in appearance, no palpable masses or tenderness,  no skin retraction, no nipple inversion, no nipple discharge, no skin discoloration, no axillary or supraclavicular lymphadenopathy Abdomen: no palpable  masses or tenderness, no rebound or guarding Extremities: no edema or skin discoloration or tenderness  Pelvic:  Bartholin, Urethra, Skene Glands: Within normal limits             Vagina: No gross lesions or discharge  Cervix: No gross lesions or discharge  Uterus  anteverted, normal size, shape and consistency, non-tender and mobile  Adnexa  Without masses or tenderness  Anus and perineum  normal   Rectovaginal  normal sphincter tone without palpated masses or tenderness             Hemoccult obtained results pending at time of this dictation.     Assessment/Plan:  50 y.o. female for annual exam overweight. Patient is fasting today. She scheduled to see Dr. Beverely Low in the next few weeks. We'll proceed with getting her fasting lab work today consisting of a comprehensive metabolic panel, fasting lipid profile, CBC, urinalysis, TSH along with her Pap smear. She'll be encouraged to continue to take her calcium and vitamin D to increase it to 1 tablet in the morning and one in the evening. She was encouraged to engage in weightbearing exercises 45 minutes 3 or 4 times a week and well-balanced meals high in protein and fiber low fat. Was scheduled for bone density study in the office the next couple weeks. Pap smear was done today we'll see her back in one year or when necessary.  Ok Edwards MD, 10:18 AM 02/24/2011

## 2011-03-05 ENCOUNTER — Ambulatory Visit (INDEPENDENT_AMBULATORY_CARE_PROVIDER_SITE_OTHER): Payer: 59

## 2011-03-05 DIAGNOSIS — Z13820 Encounter for screening for osteoporosis: Secondary | ICD-10-CM

## 2011-03-05 DIAGNOSIS — Z780 Asymptomatic menopausal state: Secondary | ICD-10-CM

## 2011-03-05 DIAGNOSIS — M8580 Other specified disorders of bone density and structure, unspecified site: Secondary | ICD-10-CM

## 2011-03-05 DIAGNOSIS — M899 Disorder of bone, unspecified: Secondary | ICD-10-CM

## 2011-03-17 ENCOUNTER — Ambulatory Visit (INDEPENDENT_AMBULATORY_CARE_PROVIDER_SITE_OTHER): Payer: 59 | Admitting: Family Medicine

## 2011-03-17 ENCOUNTER — Encounter: Payer: Self-pay | Admitting: Family Medicine

## 2011-03-17 VITALS — BP 118/70 | HR 83 | Temp 98.6°F | Ht 60.0 in | Wt 166.6 lb

## 2011-03-17 DIAGNOSIS — Z111 Encounter for screening for respiratory tuberculosis: Secondary | ICD-10-CM

## 2011-03-17 DIAGNOSIS — Z0000 Encounter for general adult medical examination without abnormal findings: Secondary | ICD-10-CM

## 2011-03-17 NOTE — Progress Notes (Signed)
  Subjective:    Patient ID: Courtney Guzman, female    DOB: 1960/05/04, 50 y.o.   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  GYNLily Peer, UTD on pap and mammo.  UTD on colonoscopy  Hyperlipidemia- labs done on 11/6 show LDL of 149 and total cholesterol of 245.  HDL and trigs were w/in range.  GYN recommended f/u labs in 6 months.   Review of Systems Patient reports no vision/ hearing changes, adenopathy,fever, weight change,  persistant/recurrent hoarseness , swallowing issues, chest pain, palpitations, edema, persistant/recurrent cough, hemoptysis, dyspnea (rest/exertional/paroxysmal nocturnal), gastrointestinal bleeding (melena, rectal bleeding), abdominal pain, significant heartburn, bowel changes, GU symptoms (dysuria, hematuria, incontinence), Gyn symptoms (abnormal  bleeding, pain),  syncope, focal weakness, memory loss, numbness & tingling, skin/hair/nail changes, abnormal bruising or bleeding, anxiety, or depression.     Objective:   Physical Exam General Appearance:    Alert, cooperative, no distress, appears stated age  Head:    Normocephalic, without obvious abnormality, atraumatic  Eyes:    PERRL, conjunctiva/corneas clear, EOM's intact, fundi    benign, both eyes  Ears:    Normal TM's and external ear canals, both ears  Nose:   Nares normal, septum midline, mucosa normal, no drainage    or sinus tenderness  Throat:   Lips, mucosa, and tongue normal; teeth and gums normal  Neck:   Supple, symmetrical, trachea midline, no adenopathy;    Thyroid: no enlargement/tenderness/nodules  Back:     Symmetric, no curvature, ROM normal, no CVA tenderness  Lungs:     Clear to auscultation bilaterally, respirations unlabored  Chest Wall:    No tenderness or deformity   Heart:    Regular rate and rhythm, S1 and S2 normal, no murmur, rub   or gallop  Breast Exam:    Deferred to GYN  Abdomen:     Soft, non-tender, bowel sounds active all four  quadrants,    no masses, no organomegaly  Genitalia:    Deferred to GYN  Rectal:    Extremities:   Extremities normal, atraumatic, no cyanosis or edema  Pulses:   2+ and symmetric all extremities  Skin:   Skin color, texture, turgor normal, no rashes or lesions  Lymph nodes:   Cervical, supraclavicular, and axillary nodes normal  Neurologic:   CNII-XII intact, normal strength, sensation and reflexes    throughout          Assessment & Plan:

## 2011-03-17 NOTE — Patient Instructions (Signed)
Come back on Thursday or Friday to recheck the TB test Follow up in 6 months to recheck cholesterol Try and make healthy food choices and get regular exercise Continue the Calcium and Vit D to improve your bones Call with any questions or concerns Happy Holidays!!!

## 2011-03-19 LAB — TB SKIN TEST
Induration: 0
TB Skin Test: NEGATIVE mm

## 2011-03-29 DIAGNOSIS — Z0000 Encounter for general adult medical examination without abnormal findings: Secondary | ICD-10-CM | POA: Insufficient documentation

## 2011-03-29 NOTE — Assessment & Plan Note (Signed)
Pt's PE WNL.  UTD on health maintenance.  Reviewed labs done by GYN.  Anticipatory guidance provided.

## 2011-04-15 ENCOUNTER — Telehealth: Payer: Self-pay | Admitting: *Deleted

## 2011-04-15 MED ORDER — ALBUTEROL SULFATE HFA 108 (90 BASE) MCG/ACT IN AERS: 2.0000 | INHALATION_SPRAY | RESPIRATORY_TRACT | Status: DC | PRN

## 2011-04-15 NOTE — Telephone Encounter (Signed)
Pt left vm stating that she is almost out of the inhaler she was given and was advised that the order would be called in for her per almost out, did not note any inhaler in medications nor on OV notes, called pt and was advised that she is using an albuterol inhaler was used as needed and wanted one called in to [facility] on [address] wanted to clarify directions?

## 2011-04-15 NOTE — Telephone Encounter (Signed)
rx sent to pharmacy by e-script  

## 2011-04-15 NOTE — Telephone Encounter (Signed)
Albuterol HFA- 2 puffs every 4 hrs prn for wheezing.  #1, 3 refills

## 2011-07-28 ENCOUNTER — Other Ambulatory Visit: Payer: Self-pay | Admitting: *Deleted

## 2011-07-28 DIAGNOSIS — E7800 Pure hypercholesterolemia, unspecified: Secondary | ICD-10-CM

## 2012-02-23 ENCOUNTER — Other Ambulatory Visit: Payer: Self-pay | Admitting: Family Medicine

## 2012-02-23 DIAGNOSIS — Z1231 Encounter for screening mammogram for malignant neoplasm of breast: Secondary | ICD-10-CM

## 2012-03-22 ENCOUNTER — Ambulatory Visit
Admission: RE | Admit: 2012-03-22 | Discharge: 2012-03-22 | Disposition: A | Discharge status: Home / Self Care | Payer: 59 | Source: Ambulatory Visit | Attending: Family Medicine | Admitting: Family Medicine

## 2012-03-22 ENCOUNTER — Encounter: Payer: Self-pay | Admitting: Gynecology

## 2012-03-22 ENCOUNTER — Ambulatory Visit (INDEPENDENT_AMBULATORY_CARE_PROVIDER_SITE_OTHER): Payer: 59 | Admitting: Gynecology

## 2012-03-22 VITALS — BP 122/80 | Ht 61.0 in | Wt 170.0 lb

## 2012-03-22 DIAGNOSIS — Z01419 Encounter for gynecological examination (general) (routine) without abnormal findings: Secondary | ICD-10-CM

## 2012-03-22 DIAGNOSIS — R141 Gas pain: Secondary | ICD-10-CM

## 2012-03-22 DIAGNOSIS — R635 Abnormal weight gain: Secondary | ICD-10-CM

## 2012-03-22 DIAGNOSIS — M949 Disorder of cartilage, unspecified: Secondary | ICD-10-CM

## 2012-03-22 DIAGNOSIS — Z1231 Encounter for screening mammogram for malignant neoplasm of breast: Secondary | ICD-10-CM

## 2012-03-22 DIAGNOSIS — Z833 Family history of diabetes mellitus: Secondary | ICD-10-CM

## 2012-03-22 DIAGNOSIS — M8580 Other specified disorders of bone density and structure, unspecified site: Secondary | ICD-10-CM | POA: Insufficient documentation

## 2012-03-22 DIAGNOSIS — M899 Disorder of bone, unspecified: Secondary | ICD-10-CM

## 2012-03-22 DIAGNOSIS — R140 Abdominal distension (gaseous): Secondary | ICD-10-CM

## 2012-03-22 NOTE — Progress Notes (Signed)
Courtney Guzman 09-16-1960 [id number]   History:    51 y.o.  for annual gyn exam with no complaints today.She has fibromyalgia and has been followed by Dr. Corliss Skains. Her primary care physician is Dr. Beverely Low. Patient had a colonoscopy in 2011 by Dr. Dickie La with benign polyps detected. Patient's last bone density study was in November 2012 with a low T score of -1.5 and the AP spine in the osteopenic range (decreased bone mineralization). Patient's taking calcium and vitamin D. Patient with no past history of abnormal Pap smear. She had her mammogram today. Patient has been complaining of bloating sensation for past few months. Patient with BMI 32.12. Patient's sister is diabetic. Patient is postmenopausal on no hormone replacement therapy.   Past medical history,surgical history, family history and social history were all reviewed and documented in the EPIC chart.  Gynecologic History Patient's last menstrual period was 02/24/2008. Contraception: post menopausal status Last Pap: 2012. Results were: normal Last mammogram: 2013. Results were: Done today results pending  Obstetric History OB History    Grav Para Term Preterm Abortions TAB SAB Ect Mult Living   2 2 2       2      # Outc Date GA Lbr Len/2nd Wgt Sex Del Anes PTL Lv   1 TRM     F SVD   Yes   2 TRM     F CS   Yes       ROS: A ROS was performed and pertinent positives and negatives are included in the history.  GENERAL: No fevers or chills. HEENT: No change in vision, no earache, sore throat or sinus congestion. NECK: No pain or stiffness. CARDIOVASCULAR: No chest pain or pressure. No palpitations. PULMONARY: No shortness of breath, cough or wheeze. GASTROINTESTINAL: No abdominal pain, nausea, vomiting or diarrhea, melena or bright red blood per rectum. GENITOURINARY: No urinary frequency, urgency, hesitancy or dysuria. MUSCULOSKELETAL: No joint or muscle pain, no back pain, no recent trauma. DERMATOLOGIC: No rash, no itching, no lesions.  ENDOCRINE: No polyuria, polydipsia, no heat or cold intolerance. No recent change in weight. HEMATOLOGICAL: No anemia or easy bruising or bleeding. NEUROLOGIC: No headache, seizures, numbness, tingling or weakness. PSYCHIATRIC: No depression, no loss of interest in normal activity or change in sleep pattern.     Exam: chaperone present  BP 122/80  Ht 5\' 1"  (1.549 m)  Wt 170 lb (77.111 kg)  BMI 32.12 kg/m2  LMP 02/24/2008  Body mass index is 32.12 kg/(m^2).  General appearance : Well developed well nourished female. No acute distress HEENT: Neck supple, trachea midline, no carotid bruits, no thyroidmegaly Lungs: Clear to auscultation, no rhonchi or wheezes, or rib retractions  Heart: Regular rate and rhythm, no murmurs or gallops Breast:Examined in sitting and supine position were symmetrical in appearance, no palpable masses or tenderness,  no skin retraction, no nipple inversion, no nipple discharge, no skin discoloration, no axillary or supraclavicular lymphadenopathy Abdomen: no palpable masses or tenderness, no rebound or guarding Extremities: no edema or skin discoloration or tenderness  Pelvic:  Bartholin, Urethra, Skene Glands: Within normal limits             Vagina: No gross lesions or discharge  Cervix: No gross lesions or discharge  Uterus  anteverted, normal size, shape and consistency, non-tender and mobile  Adnexa  Without masses or tenderness  Anus and perineum  normal   Rectovaginal  normal sphincter tone without palpated masses or tenderness  Hemoccult cards provided     Assessment/Plan:  51 y.o. female for annual exam will return to the office in one to 2 weeks for an ultrasound for better assessment of her adnexa due to her abdominal girth and her complaining of abdominal bloating. The following labs will be drawn today: Fasting blood sugar, TSH, fasting lipid profile, CBC and urinalysis. No Pap smear done today. New guidelines discussed. Patient's flu  vaccine is up-to-date. Patient wanted a refill of her Prilosec 40 mg daily for her GERD. Hemoccult cards provided her to submit to the office for testing. She was reminded to continue to take her calcium and vitamin D twice a day along with weightbearing exercises 3-4 times a week for osteoporosis prevention. She will need a bone density study next year.    Ok Edwards MD, 10:13 AM 03/22/2012

## 2012-03-22 NOTE — Patient Instructions (Addendum)
Cholesterol Control Diet  Cholesterol levels in your body are determined significantly by your diet. Cholesterol levels may also be related to heart disease. The following material helps to explain this relationship and discusses what you can do to help keep your heart healthy. Not all cholesterol is bad. Low-density lipoprotein (LDL) cholesterol is the "bad" cholesterol. It may cause fatty deposits to build up inside your arteries. High-density lipoprotein (HDL) cholesterol is "good." It helps to remove the "bad" LDL cholesterol from your blood. Cholesterol is a very important risk factor for heart disease. Other risk factors are high blood pressure, smoking, stress, heredity, and weight. The heart muscle gets its supply of blood through the coronary arteries. If your LDL cholesterol is high and your HDL cholesterol is low, you are at risk for having fatty deposits build up in your coronary arteries. This leaves less room through which blood can flow. Without sufficient blood and oxygen, the heart muscle cannot function properly and you may feel chest pains (angina pectoris). When a coronary artery closes up entirely, a part of the heart muscle may die, causing a heart attack (myocardial infarction). CHECKING CHOLESTEROL When your caregiver sends your blood to a lab to be analyzed for cholesterol, a complete lipid (fat) profile may be done. With this test, the total amount of cholesterol and levels of LDL and HDL are determined. Triglycerides are a type of fat that circulates in the blood and can also be used to determine heart disease risk. The list below describes what the numbers should be: Test: Total Cholesterol.  Less than 200 mg/dl.  Test: LDL "bad cholesterol."  Less than 100 mg/dl.   Less than 70 mg/dl if you are at very high risk of a heart attack or sudden cardiac death.  Test: HDL "good cholesterol."  Greater than 50 mg/dl for women.    Greater than 40 mg/dl for men.  Test: Triglycerides.  Less than 150 mg/dl.  CONTROLLING CHOLESTEROL WITH DIET Although exercise and lifestyle factors are important, your diet is key. That is because certain foods are known to raise cholesterol and others to lower it. The goal is to balance foods for their effect on cholesterol and more importantly, to replace saturated and trans fat with other types of fat, such as monounsaturated fat, polyunsaturated fat, and omega-3 fatty acids. On average, a person should consume no more than 15 to 17 g of saturated fat daily. Saturated and trans fats are considered "bad" fats, and they will raise LDL cholesterol. Saturated fats are primarily found in animal products such as meats, butter, and cream. However, that does not mean you need to sacrifice all your favorite foods. Today, there are good tasting, low-fat, low-cholesterol substitutes for most of the things you like to eat. Choose low-fat or nonfat alternatives. Choose round or loin cuts of red meat, since these types of cuts are lowest in fat and cholesterol. Chicken (without the skin), fish, veal, and ground [place] breast are excellent choices. Eliminate fatty meats, such as hot dogs and salami. Even shellfish have little or no saturated fat. Have a 3 oz (85 g) portion when you eat lean meat, poultry, or fish. Trans fats are also called "partially hydrogenated oils." They are oils that have been scientifically manipulated so that they are solid at room temperature resulting in a longer shelf life and improved taste and texture of foods in which they are added. Trans fats are found in stick margarine, some tub margarines, cookies, crackers, and baked goods.  When baking and cooking, oils are an excellent substitute for butter. The monounsaturated oils are especially beneficial since it is believed they lower LDL and raise HDL. The oils you should avoid entirely are saturated tropical oils, such as coconut and  palm.  Remember to eat liberally from food groups that are naturally free of saturated and trans fat, including fish, fruit, vegetables, beans, grains (barley, rice, couscous, bulgur wheat), and pasta (without cream sauces).  IDENTIFYING FOODS THAT LOWER CHOLESTEROL  Soluble fiber may lower your cholesterol. This type of fiber is found in fruits such as apples, vegetables such as broccoli, potatoes, and carrots, legumes such as beans, peas, and lentils, and grains such as barley. Foods fortified with plant sterols (phytosterol) may also lower cholesterol. You should eat at least 2 g per day of these foods for a cholesterol lowering effect.  Read package labels to identify low-saturated fats, trans fats free, and low-fat foods at the supermarket. Select cheeses that have only 2 to 3 g saturated fat per ounce. Use a heart-healthy tub margarine that is free of trans fats or partially hydrogenated oil. When buying baked goods (cookies, crackers), avoid partially hydrogenated oils. Breads and muffins should be made from whole grains (whole-wheat or whole oat flour, instead of "flour" or "enriched flour"). Buy non-creamy canned soups with reduced salt and no added fats.  FOOD PREPARATION TECHNIQUES  Never deep-fry. If you must fry, either stir-fry, which uses very little fat, or use non-stick cooking sprays. When possible, broil, bake, or roast meats, and steam vegetables. Instead of dressing vegetables with butter or margarine, use lemon and herbs, applesauce and cinnamon (for squash and sweet potatoes), nonfat yogurt, salsa, and low-fat dressings for salads.  LOW-SATURATED FAT / LOW-FAT FOOD SUBSTITUTES Meats / Saturated Fat (g)  Avoid: Steak, marbled (3 oz/85 g) / 11 g   Choose: Steak, lean (3 oz/85 g) / 4 g   Avoid: Hamburger (3 oz/85 g) / 7 g   Choose: Hamburger, lean (3 oz/85 g) / 5 g   Avoid: Ham (3 oz/85 g) / 6 g   Choose: Ham, lean cut (3 oz/85 g) / 2.4 g   Avoid: Chicken, with skin, dark  meat (3 oz/85 g) / 4 g   Choose: Chicken, skin removed, dark meat (3 oz/85 g) / 2 g   Avoid: Chicken, with skin, light meat (3 oz/85 g) / 2.5 g   Choose: Chicken, skin removed, light meat (3 oz/85 g) / 1 g  Dairy / Saturated Fat (g)  Avoid: Whole milk (1 cup) / 5 g   Choose: Low-fat milk, 2% (1 cup) / 3 g   Choose: Low-fat milk, 1% (1 cup) / 1.5 g   Choose: Skim milk (1 cup) / 0.3 g   Avoid: Hard cheese (1 oz/28 g) / 6 g   Choose: Skim milk cheese (1 oz/28 g) / 2 to 3 g   Avoid: Cottage cheese, 4% fat (1 cup) / 6.5 g   Choose: Low-fat cottage cheese, 1% fat (1 cup) / 1.5 g   Avoid: Ice cream (1 cup) / 9 g   Choose: Sherbet (1 cup) / 2.5 g   Choose: Nonfat frozen yogurt (1 cup) / 0.3 g   Choose: Frozen fruit bar / trace   Avoid: Whipped cream (1 tbs) / 3.5 g   Choose: Nondairy whipped topping (1 tbs) / 1 g  Condiments / Saturated Fat (g)  Avoid: Mayonnaise (1 tbs) / 2 g   Choose: Low-fat  mayonnaise (1 tbs) / 1 g   Avoid: Butter (1 tbs) / 7 g   Choose: Extra light margarine (1 tbs) / 1 g   Avoid: Coconut oil (1 tbs) / 11.8 g   Choose: Olive oil (1 tbs) / 1.8 g   Choose: Corn oil (1 tbs) / 1.7 g   Choose: Safflower oil (1 tbs) / 1.2 g   Choose: Sunflower oil (1 tbs) / 1.4 g   Choose: Soybean oil (1 tbs) / 2.4 g   Choose: Canola oil (1 tbs) / 1 g  Document Released: 04/06/2005 Document Revised: 12/17/2010 Document Reviewed: 09/25/2010 [facility] Patient Information 2012 [place], [place].  Exercise to Lose Weight Exercise and a healthy diet may help you lose weight. Your doctor may suggest specific exercises. EXERCISE IDEAS AND TIPS  Choose low-cost things you enjoy doing, such as walking, bicycling, or exercising to workout videos.   Take stairs instead of the elevator.   Walk during your lunch break.   Park your car further away from work or school.   Go to a gym or an exercise class.   Start with 5 to 10 minutes of exercise each day. Build up to  30 minutes of exercise 4 to 6 days a week.   Wear shoes with good support and comfortable clothes.   Stretch before and after working out.   Work out until you breathe harder and your heart beats faster.   Drink extra water when you exercise.   Do not do so much that you hurt yourself, feel dizzy, or get very short of breath.  Exercises that burn about 150 calories:  Running 1  miles in 15 minutes.   Playing volleyball for 45 to 60 minutes.   Washing and waxing a car for 45 to 60 minutes.   Playing touch football for 45 minutes.   Walking 1  miles in 35 minutes.   Pushing a stroller 1  miles in 30 minutes.   Playing basketball for 30 minutes.   Raking leaves for 30 minutes.   Bicycling 5 miles in 30 minutes.   Walking 2 miles in 30 minutes.   Dancing for 30 minutes.   Shoveling snow for 15 minutes.   Swimming laps for 20 minutes.   Walking up stairs for 15 minutes.   Bicycling 4 miles in 15 minutes.   Gardening for 30 to 45 minutes.   Jumping rope for 15 minutes.   Washing windows or floors for 45 to 60 minutes.  Document Released: 05/09/2010 Document Revised: 12/17/2010 Document Reviewed: 05/09/2010 [facility] Patient Information 2012 [place], [place].

## 2012-03-23 LAB — CBC WITH DIFFERENTIAL/PLATELET
Basophils Absolute: 0 10*3/uL (ref 0.0–0.1)
Basophils Relative: 1 % (ref 0–1)
Eosinophils Absolute: 0.1 10*3/uL (ref 0.0–0.7)
Eosinophils Relative: 1 % (ref 0–5)
HCT: 39 % (ref 36.0–46.0)
Hemoglobin: 12.9 g/dL (ref 12.0–15.0)
Lymphocytes Relative: 52 % — ABNORMAL HIGH (ref 12–46)
Lymphs Abs: 3.4 10*3/uL (ref 0.7–4.0)
MCH: 28 pg (ref 26.0–34.0)
MCHC: 33.1 g/dL (ref 30.0–36.0)
MCV: 84.8 fL (ref 78.0–100.0)
Monocytes Absolute: 0.5 10*3/uL (ref 0.1–1.0)
Monocytes Relative: 7 % (ref 3–12)
Neutro Abs: 2.6 10*3/uL (ref 1.7–7.7)
Neutrophils Relative %: 39 % — ABNORMAL LOW (ref 43–77)
Platelets: 358 10*3/uL (ref 150–400)
RBC: 4.6 MIL/uL (ref 3.87–5.11)
RDW: 14.7 % (ref 11.5–15.5)
WBC: 6.5 10*3/uL (ref 4.0–10.5)

## 2012-03-23 LAB — LIPID PANEL
Cholesterol: 222 mg/dL — ABNORMAL HIGH (ref 0–200)
HDL: 65 mg/dL (ref >39)
LDL Cholesterol: 142 mg/dL — ABNORMAL HIGH (ref 0–99)
Total CHOL/HDL Ratio: 3.4 Ratio
Triglycerides: 73 mg/dL (ref <150)
VLDL: 15 mg/dL (ref 0–40)

## 2012-03-23 LAB — URINALYSIS W MICROSCOPIC + REFLEX CULTURE
Bacteria, UA: NONE SEEN
Bilirubin Urine: NEGATIVE
Casts: NONE SEEN
Crystals: NONE SEEN
Glucose, UA: NEGATIVE mg/dL
Hgb urine dipstick: NEGATIVE
Ketones, ur: NEGATIVE mg/dL
Leukocytes, UA: NEGATIVE
Nitrite: NEGATIVE
Protein, ur: NEGATIVE mg/dL
Specific Gravity, Urine: 1.007 (ref 1.005–1.030)
Squamous Epithelial / LPF: NONE SEEN
Urobilinogen, UA: 0.2 mg/dL (ref 0.0–1.0)
pH: 6 (ref 5.0–8.0)

## 2012-03-23 LAB — TSH: TSH: 1.077 u[IU]/mL (ref 0.350–4.500)

## 2012-03-23 LAB — GLUCOSE, RANDOM: Glucose, Bld: 90 mg/dL (ref 70–99)

## 2012-03-24 ENCOUNTER — Other Ambulatory Visit: Payer: Self-pay | Admitting: Gynecology

## 2012-03-24 DIAGNOSIS — D72820 Lymphocytosis (symptomatic): Secondary | ICD-10-CM

## 2012-03-24 DIAGNOSIS — E7800 Pure hypercholesterolemia, unspecified: Secondary | ICD-10-CM

## 2012-03-31 ENCOUNTER — Other Ambulatory Visit: Payer: Self-pay | Admitting: Anesthesiology

## 2012-03-31 DIAGNOSIS — Z1211 Encounter for screening for malignant neoplasm of colon: Secondary | ICD-10-CM

## 2012-03-31 DIAGNOSIS — K921 Melena: Secondary | ICD-10-CM

## 2012-04-04 ENCOUNTER — Other Ambulatory Visit: Payer: Self-pay | Admitting: Gynecology

## 2012-04-04 DIAGNOSIS — Z1211 Encounter for screening for malignant neoplasm of colon: Secondary | ICD-10-CM

## 2012-04-08 ENCOUNTER — Ambulatory Visit (INDEPENDENT_AMBULATORY_CARE_PROVIDER_SITE_OTHER): Payer: 59

## 2012-04-08 ENCOUNTER — Encounter: Payer: Self-pay | Admitting: Gynecology

## 2012-04-08 ENCOUNTER — Ambulatory Visit (INDEPENDENT_AMBULATORY_CARE_PROVIDER_SITE_OTHER): Payer: 59 | Admitting: Gynecology

## 2012-04-08 VITALS — BP 128/84

## 2012-04-08 DIAGNOSIS — K921 Melena: Secondary | ICD-10-CM

## 2012-04-08 DIAGNOSIS — R141 Gas pain: Secondary | ICD-10-CM

## 2012-04-08 DIAGNOSIS — R143 Flatulence: Secondary | ICD-10-CM

## 2012-04-08 DIAGNOSIS — Z8041 Family history of malignant neoplasm of ovary: Secondary | ICD-10-CM

## 2012-04-08 DIAGNOSIS — R140 Abdominal distension (gaseous): Secondary | ICD-10-CM

## 2012-04-08 NOTE — Progress Notes (Signed)
51 year old who was seen in the office December 3 for her annual exam see previous encounter note for details. Patient's here for followup ultrasound to the fact she has family history of ovarian cancer and was complaining of bloating sensation.  Ultrasound demonstrated uterus measured 5.4 x 4.3 x 3.4 cm with endometrial stripe of 3.8 mm. She has 2 small fibroids one measuring 18 x 14 mm the second one measuring 6 x 6 no meter. Bilateral normal ovaries. No free fluid in the cul-de-sac.  All her labs from last visit were reviewed. Her Hemoccult cards were positive. She stated that she has had a history of hemorrhoids in the past and had been taking iron supplementation. Patient with prior colonoscopy by Dr. Dickie La 3 years ago and had benign polyps removed.  Patient will be asked to stay off the iron tablet for one week Hemoccult cards were provided. If it is positive again she will be referred to the gastroenterologist for further evaluation.

## 2012-05-11 ENCOUNTER — Other Ambulatory Visit: Payer: Self-pay | Admitting: Anesthesiology

## 2012-05-11 ENCOUNTER — Other Ambulatory Visit: Payer: Self-pay | Admitting: Gynecology

## 2012-05-11 DIAGNOSIS — Z1211 Encounter for screening for malignant neoplasm of colon: Secondary | ICD-10-CM

## 2012-06-16 ENCOUNTER — Other Ambulatory Visit: Payer: Self-pay | Admitting: Family Medicine

## 2012-06-16 DIAGNOSIS — J329 Chronic sinusitis, unspecified: Secondary | ICD-10-CM

## 2012-06-16 NOTE — Telephone Encounter (Signed)
Refill for [facility] sent [facility]

## 2012-10-25 ENCOUNTER — Other Ambulatory Visit: Payer: Self-pay | Admitting: Gynecology

## 2012-10-26 ENCOUNTER — Other Ambulatory Visit: Payer: Self-pay | Admitting: Gynecology

## 2013-02-23 ENCOUNTER — Other Ambulatory Visit: Payer: Self-pay

## 2013-02-23 DIAGNOSIS — Z1231 Encounter for screening mammogram for malignant neoplasm of breast: Secondary | ICD-10-CM

## 2013-03-01 ENCOUNTER — Other Ambulatory Visit: Payer: Self-pay | Admitting: Family Medicine

## 2013-03-02 NOTE — Telephone Encounter (Signed)
Medication denied. Pt has not been seen in our office since 2012. Needs an OV>

## 2013-03-30 ENCOUNTER — Encounter: Payer: Self-pay | Admitting: Gynecology

## 2013-03-30 ENCOUNTER — Ambulatory Visit
Admission: RE | Admit: 2013-03-30 | Discharge: 2013-03-30 | Disposition: A | Discharge status: Home / Self Care | Payer: 59 | Source: Ambulatory Visit

## 2013-03-30 DIAGNOSIS — Z1231 Encounter for screening mammogram for malignant neoplasm of breast: Secondary | ICD-10-CM

## 2013-04-11 ENCOUNTER — Ambulatory Visit (INDEPENDENT_AMBULATORY_CARE_PROVIDER_SITE_OTHER): Payer: 59 | Admitting: Gynecology

## 2013-04-11 ENCOUNTER — Encounter: Payer: Self-pay | Admitting: Gynecology

## 2013-04-11 VITALS — BP 128/86 | Ht 61.0 in | Wt 175.6 lb

## 2013-04-11 DIAGNOSIS — Z23 Encounter for immunization: Secondary | ICD-10-CM

## 2013-04-11 DIAGNOSIS — M899 Disorder of bone, unspecified: Secondary | ICD-10-CM

## 2013-04-11 DIAGNOSIS — Z01419 Encounter for gynecological examination (general) (routine) without abnormal findings: Secondary | ICD-10-CM

## 2013-04-11 DIAGNOSIS — N951 Menopausal and female climacteric states: Secondary | ICD-10-CM

## 2013-04-11 DIAGNOSIS — Z780 Asymptomatic menopausal state: Secondary | ICD-10-CM

## 2013-04-11 DIAGNOSIS — M8580 Other specified disorders of bone density and structure, unspecified site: Secondary | ICD-10-CM

## 2013-04-11 LAB — CBC WITH DIFFERENTIAL/PLATELET
Basophils Absolute: 0 10*3/uL (ref 0.0–0.1)
Basophils Relative: 0 % (ref 0–1)
Eosinophils Absolute: 0.1 10*3/uL (ref 0.0–0.7)
Eosinophils Relative: 1 % (ref 0–5)
HCT: 38.6 % (ref 36.0–46.0)
Hemoglobin: 12.8 g/dL (ref 12.0–15.0)
Lymphocytes Relative: 58 % — ABNORMAL HIGH (ref 12–46)
Lymphs Abs: 4.1 10*3/uL — ABNORMAL HIGH (ref 0.7–4.0)
MCH: 27.9 pg (ref 26.0–34.0)
MCHC: 33.2 g/dL (ref 30.0–36.0)
MCV: 84.1 fL (ref 78.0–100.0)
Monocytes Absolute: 0.4 10*3/uL (ref 0.1–1.0)
Monocytes Relative: 6 % (ref 3–12)
Neutro Abs: 2.5 10*3/uL (ref 1.7–7.7)
Neutrophils Relative %: 35 % — ABNORMAL LOW (ref 43–77)
Platelets: 416 10*3/uL — ABNORMAL HIGH (ref 150–400)
RBC: 4.59 MIL/uL (ref 3.87–5.11)
RDW: 15.3 % (ref 11.5–15.5)
WBC: 7.2 10*3/uL (ref 4.0–10.5)

## 2013-04-11 LAB — COMPREHENSIVE METABOLIC PANEL
ALT: 18 U/L (ref 0–35)
AST: 22 U/L (ref 0–37)
Albumin: 4.5 g/dL (ref 3.5–5.2)
Alkaline Phosphatase: 96 U/L (ref 39–117)
BUN: 12 mg/dL (ref 6–23)
CO2: 28 mEq/L (ref 19–32)
Calcium: 10.3 mg/dL (ref 8.4–10.5)
Chloride: 103 mEq/L (ref 96–112)
Creat: 0.61 mg/dL (ref 0.50–1.10)
Glucose, Bld: 98 mg/dL (ref 70–99)
Potassium: 4.2 mEq/L (ref 3.5–5.3)
Sodium: 139 mEq/L (ref 135–145)
Total Bilirubin: 0.6 mg/dL (ref 0.3–1.2)
Total Protein: 7.4 g/dL (ref 6.0–8.3)

## 2013-04-11 LAB — LIPID PANEL
Cholesterol: 249 mg/dL — ABNORMAL HIGH (ref 0–200)
HDL: 68 mg/dL (ref >39)
LDL Cholesterol: 160 mg/dL — ABNORMAL HIGH (ref 0–99)
Total CHOL/HDL Ratio: 3.7 Ratio
Triglycerides: 103 mg/dL (ref <150)
VLDL: 21 mg/dL (ref 0–40)

## 2013-04-11 LAB — TSH: TSH: 0.994 u[IU]/mL (ref 0.350–4.500)

## 2013-04-11 MED ORDER — OMEPRAZOLE 40 MG PO CPDR: 40.0000 mg | DELAYED_RELEASE_CAPSULE | Freq: Every day | ORAL | Status: DC

## 2013-04-11 NOTE — Progress Notes (Signed)
Courtney Guzman [birth year] [id number]   History:    52 y.o.  for annual gyn exam with no complaints today. Review of her records indicated she was weighing 170 and is up to 175 pounds now. Patient had a colonoscopy 2012 were by benign polyps were detected. Her mammogram was normal this year. Her last bone density study 2012 close to score a piece of 5-1.5. Patient currently taking calcium and vitamin D. Patient with no past history of abnormal Pap smear. She takes Prilosec 40 mg daily for birth. Patient declined flu vaccine today. Patient menopausal on no hormone replacement therapy denies any vasomotor symptoms.  Past medical history,surgical history, family history and social history were all reviewed and documented in the EPIC chart.  Gynecologic History Patient's last menstrual period was 02/24/2008. Contraception: post menopausal status Last Pap: 2012. Results were: normal Last mammogram: 2014. Results were: normal  Obstetric History OB History  Gravida Para Term Preterm AB SAB TAB Ectopic Multiple Living  2 2 2       2     # Outcome Date GA Lbr Len/2nd Weight Sex Delivery Anes PTL Lv  2 TRM     F CS   Y  1 TRM     F SVD   Y       ROS: A ROS was performed and pertinent positives and negatives are included in the history.  GENERAL: No fevers or chills. HEENT: No change in vision, no earache, sore throat or sinus congestion. NECK: No pain or stiffness. CARDIOVASCULAR: No chest pain or pressure. No palpitations. PULMONARY: No shortness of breath, cough or wheeze. GASTROINTESTINAL: No abdominal pain, nausea, vomiting or diarrhea, melena or bright red blood per rectum. GENITOURINARY: No urinary frequency, urgency, hesitancy or dysuria. MUSCULOSKELETAL: No joint or muscle pain, no back pain, no recent trauma. DERMATOLOGIC: No rash, no itching, no lesions. ENDOCRINE: No polyuria, polydipsia, no heat or cold intolerance. No recent change in weight. HEMATOLOGICAL: No anemia or easy bruising or  bleeding. NEUROLOGIC: No headache, seizures, numbness, tingling or weakness. PSYCHIATRIC: No depression, no loss of interest in normal activity or change in sleep pattern.     Exam: chaperone present  BP 128/86  Ht 5\' 1"  (1.549 m)  Wt 175 lb 9.6 oz (79.652 kg)  BMI 33.20 kg/m2  LMP 02/24/2008  Body mass index is 33.2 kg/(m^2).  General appearance : Well developed well nourished female. No acute distress HEENT: Neck supple, trachea midline, no carotid bruits, no thyroidmegaly Lungs: Clear to auscultation, no rhonchi or wheezes, or rib retractions  Heart: Regular rate and rhythm, no murmurs or gallops Breast:Examined in sitting and supine position were symmetrical in appearance, no palpable masses or tenderness,  no skin retraction, no nipple inversion, no nipple discharge, no skin discoloration, no axillary or supraclavicular lymphadenopathy Abdomen: no palpable masses or tenderness, no rebound or guarding Extremities: no edema or skin discoloration or tenderness  Pelvic:  Bartholin, Urethra, Skene Glands: Within normal limits             Vagina: No gross lesions or discharge  Cervix: No gross lesions or discharge  Uterus  anteverted, normal size, shape and consistency, non-tender and mobile  Adnexa  Without masses or tenderness  Anus and perineum  normal   Rectovaginal  normal sphincter tone without palpated masses or tenderness             Hemoccult cards provided   Decline flu vaccine  Assessment/Plan:  52 y.o. female for annual exam  with no unusual findings today. The following labs were ordered: CBC, fasting lipid profile, comprehensive metabolic panel, TSH, and urinalysis. She did receive a Tdap vaccine today. Pap smear not done today in accordance to the new guidelines.  Note: This dictation was prepared with  Dragon/digital dictation along withSmart phrase technology. Any transcriptional errors that result from this process are unintentional.   Ok Edwards MD, 1:06  PM 04/11/2013

## 2013-04-11 NOTE — Patient Instructions (Signed)
Tetanus, Diphtheria (Td) Vaccine What You Need to Know WHY GET VACCINATED? Tetanus  and diphtheria are very serious diseases. They are rare in the [place] today, but people who do become infected often have severe complications. Td vaccine is used to protect adolescents and adults from both of these diseases. Both tetanus and diphtheria are infections caused by bacteria. Diphtheria spreads from person to person through coughing or sneezing. Tetanus-causing bacteria enter the body through cuts, scratches, or wounds. TETANUS (Lockjaw) causes painful muscle tightening and stiffness, usually all over the body.  It can lead to tightening of muscles in the head and neck so you can't open your mouth, swallow, or sometimes even breathe. Tetanus kills about 1 out of every 5 people who are infected. DIPHTHERIA can cause a thick coating to form in the back of the throat.  It can lead to breathing problems, paralysis, heart failure, and death. Before vaccines, the [place] saw as many as 200,000 cases a year of diphtheria and hundreds of cases of tetanus. Since vaccination began, cases of both diseases have dropped by about 99%. TD VACCINE Td vaccine can protect adolescents and adults from tetanus and diphtheria. Td is usually given as a booster dose every 10 years but it can also be given earlier after a severe and dirty wound or burn. Your doctor can give you more information. Td may safely be given at the same time as other vaccines. SOME PEOPLE SHOULD NOT GET THIS VACCINE  If you ever had a life-threatening allergic reaction after a dose of any tetanus or diphtheria containing vaccine, OR if you have a severe allergy to any part of this vaccine, you should not get Td. Tell your doctor if you have any severe allergies.  Talk to your doctor if you:  have epilepsy or another nervous system problem,  had severe pain or swelling after any vaccine containing diphtheria or tetanus,  ever had  Guillain Barr Syndrome (GBS),  aren't feeling well on the day the shot is scheduled. RISKS OF A VACCINE REACTION With a vaccine, like any medicine, there is a chance of side effects. These are usually mild and go away on their own. Serious side effects are also possible, but are very rare. Most people who get Td vaccine do not have any problems with it. Mild Problems  following Td (Did not interfere with activities)  Pain where the shot was given (about 8 people in 10)  Redness or swelling where the shot was given (about 1 person in 3)  Mild fever (about 1 person in 15)  Headache or Tiredness (uncommon) Moderate Problems following Td (Interfered with activities, but did not require medical attention)  Fever over 102 F (38.9 C) (rare) Severe Problems  following Td (Unable to perform usual activities; required medical attention)  Swelling, severe pain, bleeding, or redness in the arm where the shot was given (rare). Problems that could happen after any vaccine:  Brief fainting spells can happen after any medical procedure, including vaccination. Sitting or lying down for about 15 minutes can help prevent fainting, and injuries caused by a fall. Tell your doctor if you feel dizzy, or have vision changes or ringing in the ears.  Severe shoulder pain and reduced range of motion in the arm where a shot was given can happen, very rarely, after a vaccination.  Severe allergic reactions from a vaccine are very rare, estimated at less than 1 in a million doses. If one were to occur, it would   usually be within a few minutes to a few hours after the vaccination. WHAT IF THERE IS A SERIOUS REACTION? What should I look for?  Look for anything that concerns you, such as signs of a severe allergic reaction, very high fever, or behavior changes. Signs of a severe allergic reaction can include hives, swelling of the face and throat, difficulty breathing, a fast heartbeat, dizziness, and  weakness. These would usually start a few minutes to a few hours after the vaccination. What should I do?  If you think it is a severe allergic reaction or other emergency that can't wait, call 911 or get the person to the nearest hospital. Otherwise, call your doctor.  Afterward, the reaction should be reported to the Vaccine Adverse Event Reporting System (VAERS). Your doctor might file this report, or, you can do it yourself through the VAERS website or by calling 1-800-822-7967. VAERS is only for reporting reactions. They do not give medical advice. THE NATIONAL VACCINE INJURY COMPENSATION PROGRAM The National Vaccine Injury Compensation Program (VICP) is a federal program that was created to compensate people who may have been injured by certain vaccines. Persons who believe they may have been injured by a vaccine can learn about the program and about filing a claim by calling 1-800-338-2382 or visiting the VICP website. HOW CAN I LEARN MORE?  Ask your doctor.  Contact your local or state health department.  Contact the Centers for Disease Control and Prevention (CDC):  Call 1-800-232-4636 (1-800-CDC-INFO)  Visit CDC's vaccines website CDC Td Vaccine Interim VIS (05/24/12) Document Released: 02/01/2006 Document Revised: 08/01/2012 Document Reviewed: 07/27/2012 [employer] Patient Information 2014 [employer].  

## 2013-04-12 LAB — URINALYSIS W MICROSCOPIC + REFLEX CULTURE
Bacteria, UA: NONE SEEN
Bilirubin Urine: NEGATIVE
Casts: NONE SEEN
Crystals: NONE SEEN
Glucose, UA: NEGATIVE mg/dL
Hgb urine dipstick: NEGATIVE
Ketones, ur: NEGATIVE mg/dL
Nitrite: NEGATIVE
Protein, ur: NEGATIVE mg/dL
Specific Gravity, Urine: <1.005 — ABNORMAL LOW (ref 1.005–1.030)
Urobilinogen, UA: 0.2 mg/dL (ref 0.0–1.0)
pH: 6.5 (ref 5.0–8.0)

## 2013-04-13 LAB — URINE CULTURE

## 2013-04-24 ENCOUNTER — Encounter: Payer: Self-pay | Admitting: Gynecology

## 2013-04-24 ENCOUNTER — Other Ambulatory Visit: Payer: Self-pay | Admitting: Gynecology

## 2013-04-24 DIAGNOSIS — E7800 Pure hypercholesterolemia, unspecified: Secondary | ICD-10-CM

## 2013-04-24 DIAGNOSIS — D691 Qualitative platelet defects: Secondary | ICD-10-CM

## 2013-07-20 ENCOUNTER — Ambulatory Visit (INDEPENDENT_AMBULATORY_CARE_PROVIDER_SITE_OTHER): Payer: 59

## 2013-07-20 ENCOUNTER — Other Ambulatory Visit: Payer: Self-pay | Admitting: Gynecology

## 2013-07-20 DIAGNOSIS — M949 Disorder of cartilage, unspecified: Secondary | ICD-10-CM

## 2013-07-20 DIAGNOSIS — Z780 Asymptomatic menopausal state: Secondary | ICD-10-CM

## 2013-07-20 DIAGNOSIS — M899 Disorder of bone, unspecified: Secondary | ICD-10-CM

## 2013-07-20 DIAGNOSIS — M8580 Other specified disorders of bone density and structure, unspecified site: Secondary | ICD-10-CM

## 2013-08-18 ENCOUNTER — Other Ambulatory Visit: Payer: Self-pay | Admitting: Gynecology

## 2013-08-23 ENCOUNTER — Other Ambulatory Visit: Payer: Self-pay | Admitting: *Deleted

## 2013-08-23 DIAGNOSIS — M8580 Other specified disorders of bone density and structure, unspecified site: Secondary | ICD-10-CM

## 2013-10-04 ENCOUNTER — Telehealth: Payer: Self-pay | Admitting: *Deleted

## 2013-10-04 NOTE — Telephone Encounter (Signed)
Pt left message in triage voicemail asking the time our lab opens. I called pt back and left on her voicemail 9:00 am

## 2013-10-06 ENCOUNTER — Other Ambulatory Visit: Payer: 59

## 2013-10-06 DIAGNOSIS — E7800 Pure hypercholesterolemia, unspecified: Secondary | ICD-10-CM

## 2013-10-06 DIAGNOSIS — D691 Qualitative platelet defects: Secondary | ICD-10-CM

## 2013-10-06 DIAGNOSIS — M8580 Other specified disorders of bone density and structure, unspecified site: Secondary | ICD-10-CM

## 2013-10-06 LAB — CBC WITH DIFFERENTIAL/PLATELET
Basophils Absolute: 0.1 10*3/uL (ref 0.0–0.1)
Basophils Relative: 1 % (ref 0–1)
Eosinophils Absolute: 0.1 10*3/uL (ref 0.0–0.7)
Eosinophils Relative: 2 % (ref 0–5)
HCT: 36.7 % (ref 36.0–46.0)
Hemoglobin: 12.4 g/dL (ref 12.0–15.0)
Lymphocytes Relative: 59 % — ABNORMAL HIGH (ref 12–46)
Lymphs Abs: 3.7 10*3/uL (ref 0.7–4.0)
MCH: 28.4 pg (ref 26.0–34.0)
MCHC: 33.8 g/dL (ref 30.0–36.0)
MCV: 84.2 fL (ref 78.0–100.0)
Monocytes Absolute: 0.4 10*3/uL (ref 0.1–1.0)
Monocytes Relative: 6 % (ref 3–12)
Neutro Abs: 2 10*3/uL (ref 1.7–7.7)
Neutrophils Relative %: 32 % — ABNORMAL LOW (ref 43–77)
Platelets: 353 10*3/uL (ref 150–400)
RBC: 4.36 MIL/uL (ref 3.87–5.11)
RDW: 14.8 % (ref 11.5–15.5)
WBC: 6.3 10*3/uL (ref 4.0–10.5)

## 2013-10-06 LAB — LIPID PANEL
Cholesterol: 209 mg/dL — ABNORMAL HIGH (ref 0–200)
HDL: 67 mg/dL (ref >39)
LDL Cholesterol: 128 mg/dL — ABNORMAL HIGH (ref 0–99)
Total CHOL/HDL Ratio: 3.1 Ratio
Triglycerides: 72 mg/dL (ref <150)
VLDL: 14 mg/dL (ref 0–40)

## 2013-10-07 LAB — VITAMIN D 25 HYDROXY (VIT D DEFICIENCY, FRACTURES): Vit D, 25-Hydroxy: 32 ng/mL (ref 30–89)

## 2013-10-10 ENCOUNTER — Telehealth: Payer: Self-pay

## 2013-10-10 ENCOUNTER — Other Ambulatory Visit: Payer: Self-pay | Admitting: Family Medicine

## 2013-10-10 ENCOUNTER — Other Ambulatory Visit: Payer: Self-pay | Admitting: Gynecology

## 2013-10-10 DIAGNOSIS — J329 Chronic sinusitis, unspecified: Secondary | ICD-10-CM

## 2013-10-10 DIAGNOSIS — D72820 Lymphocytosis (symptomatic): Secondary | ICD-10-CM

## 2013-10-10 MED ORDER — ALBUTEROL SULFATE HFA 108 (90 BASE) MCG/ACT IN AERS
INHALATION_SPRAY | RESPIRATORY_TRACT | Status: DC
Start: 2013-10-10 — End: 2014-05-10

## 2013-10-10 NOTE — Telephone Encounter (Signed)
This is the second time I responded please prescribed/refill

## 2013-10-10 NOTE — Telephone Encounter (Signed)
Caller name: Kiwanna Spraker Relation to pt: patient Call back number:[phone] Pharmacy: [id number] [place], [place]   Reason for call: patient called to request a refill for VENTOLIN HFA 108 (90 BASE) MCG/ACT inhaler. Patient states that with the weather being so hot lately, she is starting to need her inhaler. Patient was last seen in 2012 by Dr Birdie Riddle? Please advise.

## 2013-10-10 NOTE — Telephone Encounter (Signed)
Can have 1 inhaler but will not prescribe additional medication w/o appt b/c pt has not been seen in >2 yrs

## 2013-10-10 NOTE — Telephone Encounter (Signed)
Patient said Dr. Birdie Riddle has prescribed Ventolin inhaler for her in the past as during the heat her breathing gets more difficult and inhaler helps.  She wondered if you would prescribed this for her since she was just here for CE?

## 2013-10-10 NOTE — Telephone Encounter (Signed)
Med filled and letter mailed to pt to make an appt.  

## 2013-10-10 NOTE — Telephone Encounter (Signed)
Please advise, pt has been getting labs completed by GYN. Pt was last seen for a physical 02/2011.

## 2013-10-10 NOTE — Telephone Encounter (Signed)
When I went into order this I saw that Dr. Birdie Riddle had sent it in for her earlier today.  I left message for patient telling her it is at pharmacy that Dr. Birdie Riddle has sent it in for her.

## 2013-10-11 ENCOUNTER — Telehealth: Payer: Self-pay

## 2013-10-11 NOTE — Telephone Encounter (Signed)
Patient informed. 

## 2013-10-11 NOTE — Telephone Encounter (Signed)
There are many reasons for white blood counts to vary. This may be just a variant and transitory since it is minimal. This is the reason we need to repeat it. If it still abnormal we will need to refer her to a hematologist for further analysis to determine causes which are many that may contribute to this and not necessarily all are bad causes.

## 2013-10-11 NOTE — Telephone Encounter (Signed)
Per your result note yesterday patient was advised to return to recheck CBC as her "white blood cell differential" was abnormal.  You did say it might be related to her fibromyalgia and I told her that.  She called back saying she had a couple of questions. She wants to know "why would it be low" and "what can I do to increase it"?

## 2014-02-19 ENCOUNTER — Encounter: Payer: Self-pay | Admitting: Gynecology

## 2014-03-12 ENCOUNTER — Other Ambulatory Visit: Payer: Self-pay

## 2014-03-12 DIAGNOSIS — Z1231 Encounter for screening mammogram for malignant neoplasm of breast: Secondary | ICD-10-CM

## 2014-04-03 ENCOUNTER — Ambulatory Visit: Payer: 59

## 2014-04-19 ENCOUNTER — Encounter: Payer: 59 | Admitting: Gynecology

## 2014-04-24 ENCOUNTER — Encounter: Payer: Self-pay | Admitting: Gynecology

## 2014-04-24 ENCOUNTER — Ambulatory Visit (INDEPENDENT_AMBULATORY_CARE_PROVIDER_SITE_OTHER): Payer: 59 | Admitting: Gynecology

## 2014-04-24 ENCOUNTER — Other Ambulatory Visit: Payer: Self-pay

## 2014-04-24 ENCOUNTER — Other Ambulatory Visit (HOSPITAL_COMMUNITY)
Admission: RE | Admit: 2014-04-24 | Discharge: 2014-04-24 | Disposition: A | Discharge status: Home / Self Care | Payer: 59 | Source: Ambulatory Visit | Attending: Gynecology | Admitting: Gynecology

## 2014-04-24 VITALS — BP 136/78 | Ht 61.0 in | Wt 171.0 lb

## 2014-04-24 DIAGNOSIS — Z01419 Encounter for gynecological examination (general) (routine) without abnormal findings: Secondary | ICD-10-CM | POA: Diagnosis present

## 2014-04-24 DIAGNOSIS — Z1151 Encounter for screening for human papillomavirus (HPV): Secondary | ICD-10-CM | POA: Insufficient documentation

## 2014-04-24 DIAGNOSIS — Z780 Asymptomatic menopausal state: Secondary | ICD-10-CM

## 2014-04-24 DIAGNOSIS — R3915 Urgency of urination: Secondary | ICD-10-CM

## 2014-04-24 DIAGNOSIS — Z1231 Encounter for screening mammogram for malignant neoplasm of breast: Secondary | ICD-10-CM

## 2014-04-24 LAB — URINALYSIS W MICROSCOPIC + REFLEX CULTURE
Bilirubin Urine: NEGATIVE
Glucose, UA: NEGATIVE mg/dL
Hgb urine dipstick: NEGATIVE
Ketones, ur: NEGATIVE mg/dL
Leukocytes, UA: NEGATIVE
Nitrite: NEGATIVE
Protein, ur: NEGATIVE mg/dL
Specific Gravity, Urine: <1.005 — ABNORMAL LOW (ref 1.005–1.030)
Squamous Epithelial / LPF: NEGATIVE
Urobilinogen, UA: 0.2 mg/dL (ref 0.0–1.0)
pH: 7 (ref 5.0–8.0)

## 2014-04-24 LAB — COMPREHENSIVE METABOLIC PANEL
ALT: 13 U/L (ref 0–35)
AST: 21 U/L (ref 0–37)
Albumin: 4.4 g/dL (ref 3.5–5.2)
Alkaline Phosphatase: 81 U/L (ref 39–117)
BUN: 12 mg/dL (ref 6–23)
CO2: 28 mEq/L (ref 19–32)
Calcium: 10 mg/dL (ref 8.4–10.5)
Chloride: 104 mEq/L (ref 96–112)
Creat: 0.66 mg/dL (ref 0.50–1.10)
Glucose, Bld: 102 mg/dL — ABNORMAL HIGH (ref 70–99)
Potassium: 4.5 mEq/L (ref 3.5–5.3)
Sodium: 138 mEq/L (ref 135–145)
Total Bilirubin: 0.4 mg/dL (ref 0.2–1.2)
Total Protein: 7.3 g/dL (ref 6.0–8.3)

## 2014-04-24 LAB — LIPID PANEL
Cholesterol: 233 mg/dL — ABNORMAL HIGH (ref 0–200)
HDL: 67 mg/dL
LDL Cholesterol: 144 mg/dL — ABNORMAL HIGH (ref 0–99)
Total CHOL/HDL Ratio: 3.5 ratio
Triglycerides: 109 mg/dL
VLDL: 22 mg/dL (ref 0–40)

## 2014-04-24 LAB — TSH: TSH: 0.977 u[IU]/mL (ref 0.350–4.500)

## 2014-04-24 MED ORDER — TOLTERODINE TARTRATE ER 2 MG PO CP24: 2.0000 mg | ORAL_CAPSULE | Freq: Every day | ORAL | Status: DC

## 2014-04-24 NOTE — Progress Notes (Signed)
Courtney Guzman [birth year] [id number]   History:    54 y.o.  for annual gyn exam who is complaining today is that over the past month and a half she has felt urgency to urinate but no incontinence. She denies any nocturia. Patient with strong family history of diabetes (sister). Patient is menopausal on no HRT no vasomotor symptoms reported. Flu vaccine up-to-date. Patient had colonoscopy 2012 whereby benign polyps were removed. Her last bone density study was in 2012 the lowest T score was at the [facility] with a value of -1.9. Patient is taking her calcium with vitamin D. Patient has had history of hyperlipidemia in the past currently on no medication. Patient no longer is seen her PCP less acute illnesses and wanted to have her blood work drawn today. Patient with no past history of any abnormal Pap smears.  Past medical history,surgical history, family history and social history were all reviewed and documented in the EPIC chart.  Gynecologic History Patient's last menstrual period was 02/24/2008. Contraception: post menopausal status Last Pap: 2012. Results were: normal Last mammogram: 2014. Results were: normal  Obstetric History OB History  Gravida Para Term Preterm AB SAB TAB Ectopic Multiple Living  2 2 2       2     # Outcome Date GA Lbr Len/2nd Weight Sex Delivery Anes PTL Lv  2 Term     F CS-Unspec   Y  1 Term     F Vag-Spont   Y       ROS: A ROS was performed and pertinent positives and negatives are included in the history.  GENERAL: No fevers or chills. HEENT: No change in vision, no earache, sore throat or sinus congestion. NECK: No pain or stiffness. CARDIOVASCULAR: No chest pain or pressure. No palpitations. PULMONARY: No shortness of breath, cough or wheeze. GASTROINTESTINAL: No abdominal pain, nausea, vomiting or diarrhea, melena or bright red blood per rectum. GENITOURINARY: No urinary frequency, urgency, hesitancy or dysuria. MUSCULOSKELETAL: No joint or muscle pain, no  back pain, no recent trauma. DERMATOLOGIC: No rash, no itching, no lesions. ENDOCRINE: No polyuria, polydipsia, no heat or cold intolerance. No recent change in weight. HEMATOLOGICAL: No anemia or easy bruising or bleeding. NEUROLOGIC: No headache, seizures, numbness, tingling or weakness. PSYCHIATRIC: No depression, no loss of interest in normal activity or change in sleep pattern.     Exam: chaperone present  BP 136/78 mmHg  Ht 5\' 1"  (1.549 m)  Wt 171 lb (77.565 kg)  BMI 32.33 kg/m2  LMP 02/24/2008  Body mass index is 32.33 kg/(m^2).  General appearance : Well developed well nourished female. No acute distress HEENT: Neck supple, trachea midline, no carotid bruits, no thyroidmegaly Lungs: Clear to auscultation, no rhonchi or wheezes, or rib retractions  Heart: Regular rate and rhythm, no murmurs or gallops Breast:Examined in sitting and supine position were symmetrical in appearance, no palpable masses or tenderness,  no skin retraction, no nipple inversion, no nipple discharge, no skin discoloration, no axillary or supraclavicular lymphadenopathy Abdomen: no palpable masses or tenderness, no rebound or guarding Extremities: no edema or skin discoloration or tenderness  Pelvic:  Bartholin, Urethra, Skene Glands: Within normal limits             Vagina: No gross lesions or discharge, atrophic changes  Cervix: No gross lesions or discharge  Uterus  anteverted, normal size, shape and consistency, non-tender and mobile  Adnexa  Without masses or tenderness  Anus and perineum  normal  Rectovaginal  normal sphincter tone without palpated masses or tenderness             Hemoccult cards will be provided     Assessment/Plan:  54 y.o. female for annual exam who is menopausal on no HRT no vasomotor symptoms. Pap smear done today. Patient was reminded to schedule her mammogram. Colonoscopy due next year. The following labs were ordered: Fasting lipid profile, compresses a metabolic panel,  TSH, CBC, and urinalysis. Patient was reminded on the importance of calcium vitamin D and regular exercise for osteoporosis prevention. We discussed importance of monthly breast exam. I've given her literature information on overactive bladder. We will hold off on any treatment such as anti-cholinergic since she is being followed by her ophthalmologist and next month has evaluation whereby they have been monitoring her intraocular pressure and if it is cleared by him we will start her then on Detrol LA 2 mg daily. Her urinalysis today was negative.   Terrance Mass MD, 9:12 AM 04/24/2014

## 2014-04-24 NOTE — Patient Instructions (Addendum)
Overactive Bladder The bladder has two functions that are totally opposite of the other. One is to relax and stretch out so it can store urine (fills like a balloon), and the other is to contract and squeeze down so that it can empty the urine that it has stored. Proper functioning of the bladder is a complex mixing of these two functions. The filling and emptying of the bladder can be influenced by:  The bladder.  The spinal cord.  The brain.  The nerves going to the bladder.  Other organs that are closely related to the bladder such as prostate in males and the vagina in females. As your bladder fills with urine, nerve signals are sent from the bladder to the brain to tell you that you may need to urinate. Normal urination requires that the bladder squeeze down with sufficient strength to empty the bladder, but this also requires that the bladder squeeze down sufficiently long to finish the job. In addition the sphincter muscles, which normally keep you from leaking urine, must also relax so that the urine can pass. Coordination between the bladder muscle squeezing down and the sphincter muscles relaxing is required to make everything happen normally. With an overactive bladder sometimes the muscles of the bladder contract unexpectedly and involuntarily and this causes an urgent need to urinate. The normal response is to try to hold urine in by contracting the sphincter muscles. Sometimes the bladder contracts so strongly that the sphincter muscles cannot stop the urine from passing out and incontinence occurs. This kind of incontinence is called urge incontinence. Having an overactive bladder can be embarrassing and awkward. It can keep you from living life the way you want to. Many people think it is just something you have to put up with as you grow older or have certain health conditions. In fact, there are treatments that can help make your life easier and more pleasant. CAUSES  Many things  can cause an overactive bladder. Possibilities include:  Urinary tract infection or infection of nearby tissues such as the prostate.  Prostate enlargement.  In women, multiple pregnancies or surgery on the uterus or urethra.  Bladder stones, inflammation, or tumors.  Caffeine.  Alcohol.  Medications. For example, diuretics (drugs that help the body get rid of extra fluid) increase urine production. Some other medicines must be taken with lots of fluids.  Muscle or nerve weakness. This might be the result of a spinal cord injury, a stroke, multiple sclerosis, or Parkinson disease.  Diabetes can cause a high urine volume which fills the bladder so quickly that the normal urge to urinate is triggered very strongly. SYMPTOMS   Loss of bladder control. You feel the need to urinate and cannot make your body wait.  Sudden, strong urges to urinate.  Urinating 8 or more times a day.  Waking up to urinate two or more times a night. DIAGNOSIS  To decide if you have overactive bladder, your health care provider will probably:  Ask about symptoms you have noticed.  Ask about your overall health. This will include questions about any medications you are taking.  Do a physical examination. This will help determine if there are obvious blockages or other problems.  Order some tests. These might include:  A blood test to check for diabetes or other health issues that could be contributing to the problem.  Urine testing. This could measure the flow of urine and the pressure on the bladder.  A test of your neurological   system (the brain, spinal cord, and nerves). This is the system that senses the need to urinate. Some of these tests are called flow tests, bladder pressure tests, and electrical measurements of the sphincter muscle.  A bladder test to check whether it is emptying completely when you urinate.  Cystoscopy. This test uses a thin tube with a tiny camera on it. It offers a  look inside your urethra and bladder to see if there are problems.  Imaging tests. You might be given a contrast dye and then asked to urinate. X-rays are taken to see how your bladder is working. TREATMENT  An overactive bladder can be treated in many ways. The treatment will depend on the cause. Whether you have a mild or severe case also makes a difference. Often, treatment can be given in your health care provider's office or clinic. Be sure to discuss the different options with your caregiver. They include:  Behavioral treatments. These do not involve medication or surgery:  Bladder training. For this, you would follow a schedule to urinate at regular intervals. This helps you learn to control the urge to urinate. At first, you might be asked to wait a few minutes after feeling the urge. In time, you should be able to schedule bathroom visits an hour or more apart.  Kegel exercises. These exercises strengthen the pelvic floor muscles, which support the bladder. Toning these muscles can help control urination even if the bladder muscles are overactive. A specialist will teach you how to do these exercises correctly. They will require daily practice.  Weight loss. If you are obese or overweight, losing weight might stop your bladder from being overactive. Talk to your health care provider about how many pounds you should lose. Also ask if there is a specific program or method that would work best for you.  Diet change. This might be suggested if constipation is making your overactive bladder worse. Your health care provider or a nutritionist can explain ways to change what you eat to ease constipation. Other people might need to take in less caffeine or alcohol. Sometimes drinking fewer fluids is needed, too.  Protection. This is not an actual treatment. But, you could wear special pads to take care of any leakage while you wait for other treatments to take effect. This will help you avoid  embarrassment.  Physical treatments.  Electrical stimulation. Electrodes will send gentle pulses to the nerves or muscles that help control the bladder. The goal is to strengthen them. Sometimes this is done with the electrodes outside the body. Or, they might be placed inside the body (implanted). This treatment can take several months to have an effect.  Medications. These are usually used along with other treatments. Several medicines are available. Some are injected into the muscles involved in urination. Others come in pill form. Medications sometimes prescribed include:  Anticholinergics. These drugs block the signals that the nerves deliver to the bladder. This keeps it from releasing urine at the wrong time. Researchers think the drugs might help in other ways, too.  Imipramine. This is an antidepressant. But, it relaxes bladder muscles.  Botox. This is still experimental. Some people believe that injecting it into the bladder muscles will relax them so they work more normally. It has also been injected into the sphincter muscle when the sphincter muscle does not open properly. This is a temporary fix, however. Also, it might make matters worse, especially in older people.  Surgery.  A device might be implanted   to help manage your nerves. It works on the nerves that signal when you need to urinate.  Surgery is sometimes needed with electrical stimulation. If the electrodes are implanted, this is done through surgery.  Sometimes repairs need to be made through surgery. For example, the size of the bladder can be changed. This is usually done in severe cases only. HOME CARE INSTRUCTIONS   Take any medications your health care provider prescribed or suggested. Follow the directions carefully.  Practice any lifestyle changes that are recommended. These might include:  Drinking less fluid or drinking at different times of the day. If you need to urinate often during the night, for  example, you may need to stop drinking fluids early in the evening.  Cutting down on caffeine or alcohol. They can both make an overactive bladder worse. Caffeine is found in coffee, tea, and sodas.  Doing Kegel exercises to strengthen muscles.  Losing weight, if that is recommended.  Eating a healthy and balanced diet. This will help you avoid constipation.  Keep a journal or a log. You might be asked to record how much you drink and when, and also when you feel the need to urinate.  Learn how to care for implants or other devices, such as pessaries. SEEK MEDICAL CARE IF:   Your overactive bladder gets worse.  You feel increased pain or irritation when you urinate.  You notice blood in your urine.  You have questions about any medications or devices that your health care provider recommended.  You notice blood, pus, or swelling at the site of any test or treatment procedure.  You have an oral temperature above 102F (38.9C). SEEK IMMEDIATE MEDICAL CARE IF:  You have an oral temperature above 102F (38.9C), not controlled by medicine. Document Released: 01/31/2009 Document Revised: 08/21/2013 Document Reviewed: 01/31/2009 [facility] Patient Information 2015 [place], [place]. This information is not intended to replace advice given to you by your health care provider. Make sure you discuss any questions you have with your health care provider. Tolterodine tablets What is this medicine? TOLTERODINE (tole TER a deen) is used to treat overactive bladder. This medicine reduces the amount of bathroom visits. It may also help to control wetting accidents. This medicine may be used for other purposes; ask your health care provider or pharmacist if you have questions. COMMON BRAND NAME(S): Detrol What should I tell my health care provider before I take this medicine? They need to know if you have any of these conditions: -difficulty passing urine -glaucoma -intestinal  obstruction -irregular heartbeat or you have a family member with irregular heartbeat -kidney disease -liver disease -myasthenia gravis -an unusual or allergic reaction to tolterodine, fesoterodine, other medicines, foods, dyes, or preservatives -pregnant or trying to get pregnant -breast-feeding How should I use this medicine? Take this medicine by mouth with a glass of water. Follow the directions on the prescription label. Take your doses at regular intervals. Do not take your medicine more often than directed. Talk to your pediatrician regarding the use of this medicine in children. Special care may be needed. Overdosage: If you think you have taken too much of this medicine contact a poison control center or emergency room at once. NOTE: This medicine is only for you. Do not share this medicine with others. What if I miss a dose? If you miss a dose, take it as soon as you can. If it is almost time for your next dose, take only that dose. Do not take double or  extra doses. What may interact with this medicine? -clarithromycin -cyclosporine -erythromycin -fluoxetine -medicines for fungal infections, like fluconazole, itraconazole, ketoconazole or voriconazole -vinblastine This list may not describe all possible interactions. Give your health care provider a list of all the medicines, herbs, non-prescription drugs, or dietary supplements you use. Also tell them if you smoke, drink alcohol, or use illegal drugs. Some items may interact with your medicine. What should I watch for while using this medicine? It may take 2 or 3 months to notice the full benefit from this medicine. You may need to limit your intake tea, coffee, caffeinated sodas, and alcohol. These drinks may make your symptoms worse. You may get drowsy or dizzy. Do not drive, use machinery, or do anything that needs mental alertness until you know how this drug affects you. Do not stand or sit up quickly, especially if you are  an older patient. This reduces the risk of dizzy or fainting spells. Your mouth may get dry. Chewing sugarless gum or sucking hard candy, and drinking plenty of water may help. Contact your doctor if the problem does not go away or is severe. This medicine may cause dry eyes and blurred vision. If you wear contact lenses you may feel some discomfort. Lubricating drops may help. See your eye doctor if the problem does not go away or is severe. Avoid extreme heat. This medicine can cause you to sweat less than normal. Your body temperature could increase to dangerous levels, which may lead to heat stroke. What side effects may I notice from receiving this medicine? Side effects that you should report to your doctor or health care professional as soon as possible: -allergic reactions like skin rash, itching or hives, swelling of the face, lips, or tongue -breathing problems -confusion -difficulty passing urine -fast, irregular heartbeat -hallucinations -swelling in feet, hands Side effects that usually do not require medical attention (report to your doctor or health care professional if they continue or are bothersome): -changes in vision -constipation -dry eyes, mouth -headache -dizziness, drowsiness -stomach upset This list may not describe all possible side effects. Call your doctor for medical advice about side effects. You may report side effects to FDA at 1-800-FDA-1088. Where should I keep my medicine? Keep out of the reach of children. Store at room temperature between 15 and 30 degrees C (59 and 86 degrees F). Throw away any unused medicine after the expiration date. NOTE: This sheet is a summary. It may not cover all possible information. If you have questions about this medicine, talk to your doctor, pharmacist, or health care provider.  2015, [employer] Standard. (2010-01-14 17:19:08)

## 2014-04-25 LAB — CBC WITH DIFFERENTIAL/PLATELET
Basophils Absolute: 0 10*3/uL (ref 0.0–0.1)
Basophils Relative: 0 % (ref 0–1)
Eosinophils Absolute: 0.1 10*3/uL (ref 0.0–0.7)
Eosinophils Relative: 1 % (ref 0–5)
HCT: 39.6 % (ref 36.0–46.0)
Hemoglobin: 12.9 g/dL (ref 12.0–15.0)
Lymphocytes Relative: 60 % — ABNORMAL HIGH (ref 12–46)
Lymphs Abs: 4.5 10*3/uL — ABNORMAL HIGH (ref 0.7–4.0)
MCH: 27.8 pg (ref 26.0–34.0)
MCHC: 32.6 g/dL (ref 30.0–36.0)
MCV: 85.3 fL (ref 78.0–100.0)
MPV: 9.8 fL (ref 8.6–12.4)
Monocytes Absolute: 0.5 10*3/uL (ref 0.1–1.0)
Monocytes Relative: 6 % (ref 3–12)
Neutro Abs: 2.5 10*3/uL (ref 1.7–7.7)
Neutrophils Relative %: 33 % — ABNORMAL LOW (ref 43–77)
Platelets: 381 10*3/uL (ref 150–400)
RBC: 4.64 MIL/uL (ref 3.87–5.11)
RDW: 14.8 % (ref 11.5–15.5)
WBC: 7.5 10*3/uL (ref 4.0–10.5)

## 2014-04-25 LAB — CYTOLOGY - PAP

## 2014-04-27 ENCOUNTER — Ambulatory Visit
Admission: RE | Admit: 2014-04-27 | Discharge: 2014-04-27 | Disposition: A | Discharge status: Home / Self Care | Payer: 59 | Source: Ambulatory Visit

## 2014-04-27 DIAGNOSIS — Z1231 Encounter for screening mammogram for malignant neoplasm of breast: Secondary | ICD-10-CM

## 2014-05-07 DIAGNOSIS — Z1211 Encounter for screening for malignant neoplasm of colon: Secondary | ICD-10-CM

## 2014-05-08 ENCOUNTER — Other Ambulatory Visit: Payer: 59 | Admitting: Anesthesiology

## 2014-05-08 DIAGNOSIS — Z1211 Encounter for screening for malignant neoplasm of colon: Secondary | ICD-10-CM

## 2014-05-10 ENCOUNTER — Ambulatory Visit (INDEPENDENT_AMBULATORY_CARE_PROVIDER_SITE_OTHER): Payer: 59 | Admitting: Family Medicine

## 2014-05-10 ENCOUNTER — Encounter: Payer: Self-pay | Admitting: Family Medicine

## 2014-05-10 VITALS — BP 124/80 | HR 77 | Temp 98.0°F | Resp 16 | Wt 179.0 lb

## 2014-05-10 DIAGNOSIS — J4520 Mild intermittent asthma, uncomplicated: Secondary | ICD-10-CM

## 2014-05-10 DIAGNOSIS — J45909 Unspecified asthma, uncomplicated: Secondary | ICD-10-CM | POA: Insufficient documentation

## 2014-05-10 DIAGNOSIS — E785 Hyperlipidemia, unspecified: Secondary | ICD-10-CM

## 2014-05-10 MED ORDER — ALBUTEROL SULFATE HFA 108 (90 BASE) MCG/ACT IN AERS: INHALATION_SPRAY | RESPIRATORY_TRACT | Status: DC

## 2014-05-10 NOTE — Progress Notes (Signed)
   Subjective:    Patient ID: Courtney Guzman, female    DOB: October 28, 1960, 54 y.o.   MRN: [id number]  HPI Hyperlipidemia- pt had blood work done w/ GYN.  Total cholesterol was 233, and LDL was 144.  HDL was great at 63.  + family hx of hyperlipidemia.  Denies CP, SOB, HAs, visual changes, edema, abd pain, N/V.  Pt is interested in starting natural product- Cholester-Reg II.  RAD- pt reports she has trouble in cold weather and will need to use inhalers occasionally due to chest tightness/wheezing.  Review of Systems For ROS see HPI     Objective:   Physical Exam  Constitutional: She is oriented to person, place, and time. She appears well-developed and well-nourished. No distress.  HENT:  Head: Normocephalic and atraumatic.  Eyes: Conjunctivae and EOM are normal. Pupils are equal, round, and reactive to light.  Neck: Normal range of motion. Neck supple. No thyromegaly present.  Cardiovascular: Normal rate, regular rhythm, normal heart sounds and intact distal pulses.   No murmur heard. Pulmonary/Chest: Effort normal and breath sounds normal. No respiratory distress.  Abdominal: Soft. She exhibits no distension. There is no tenderness.  Musculoskeletal: She exhibits no edema.  Lymphadenopathy:    She has no cervical adenopathy.  Neurological: She is alert and oriented to person, place, and time.  Skin: Skin is warm and dry.  Psychiatric: She has a normal mood and affect. Her behavior is normal.  Vitals reviewed.         Assessment & Plan:

## 2014-05-10 NOTE — Assessment & Plan Note (Signed)
Chronic problem.  Pt's #s have again increased and was told by GYN to f/u.  Pt is interested in attempting natural tx options prior to statin.  Pt to start herbal supplement in addition to healthy diet and regular exercise.  Will repeat labs in 3 months to determine if statin is needed.  Will follow.

## 2014-05-10 NOTE — Patient Instructions (Signed)
Follow up in 3 months to recheck cholesterol Try and make healthy food choices and get regular exercise Start the herbal supplement daily as directed Use the albuterol inhaler as needed for chest tightness/wheezing Call with any questions or concerns Welcome Back!

## 2014-05-10 NOTE — Assessment & Plan Note (Signed)
New to provider, pt has hx of similar.  Pt seems to only have trouble w/ cold, dry air.  Refill on Albuterol provided.  Will follow.

## 2014-05-10 NOTE — Progress Notes (Signed)
Pre visit review using our clinic review tool, if applicable. No additional management support is needed unless otherwise documented below in the visit note. 

## 2014-05-30 ENCOUNTER — Encounter: Payer: 59 | Admitting: Family Medicine

## 2014-05-31 ENCOUNTER — Other Ambulatory Visit: Payer: Self-pay | Admitting: Gynecology

## 2014-08-09 ENCOUNTER — Ambulatory Visit: Payer: 59 | Admitting: Family Medicine

## 2014-08-10 ENCOUNTER — Encounter: Payer: Self-pay | Admitting: Family Medicine

## 2014-08-10 ENCOUNTER — Ambulatory Visit (INDEPENDENT_AMBULATORY_CARE_PROVIDER_SITE_OTHER): Payer: 59 | Admitting: Family Medicine

## 2014-08-10 VITALS — BP 110/80 | HR 90 | Temp 98.0°F | Resp 16 | Wt 180.0 lb

## 2014-08-10 DIAGNOSIS — E785 Hyperlipidemia, unspecified: Secondary | ICD-10-CM

## 2014-08-10 LAB — HEPATIC FUNCTION PANEL
ALT: 14 U/L (ref 0–35)
AST: 21 U/L (ref 0–37)
Albumin: 4.1 g/dL (ref 3.5–5.2)
Alkaline Phosphatase: 89 U/L (ref 39–117)
Bilirubin, Direct: 0.1 mg/dL (ref 0.0–0.3)
Total Bilirubin: 0.4 mg/dL (ref 0.2–1.2)
Total Protein: 7.3 g/dL (ref 6.0–8.3)

## 2014-08-10 LAB — BASIC METABOLIC PANEL
BUN: 11 mg/dL (ref 6–23)
CO2: 29 mEq/L (ref 19–32)
Calcium: 10 mg/dL (ref 8.4–10.5)
Chloride: 107 mEq/L (ref 96–112)
Creatinine, Ser: 0.72 mg/dL (ref 0.40–1.20)
GFR: 108.34 mL/min (ref >60.00)
Glucose, Bld: 107 mg/dL — ABNORMAL HIGH (ref 70–99)
Potassium: 4.1 mEq/L (ref 3.5–5.1)
Sodium: 140 mEq/L (ref 135–145)

## 2014-08-10 LAB — LIPID PANEL
Cholesterol: 213 mg/dL — ABNORMAL HIGH (ref 0–200)
HDL: 54.7 mg/dL (ref >39.00)
LDL Cholesterol: 137 mg/dL — ABNORMAL HIGH (ref 0–99)
NonHDL: 158.3
Total CHOL/HDL Ratio: 4
Triglycerides: 107 mg/dL (ref 0.0–149.0)
VLDL: 21.4 mg/dL (ref 0.0–40.0)

## 2014-08-10 NOTE — Progress Notes (Signed)
   Subjective:    Patient ID: Courtney Guzman, female    DOB: [birth year], 54 y.o.   MRN: [id number]  HPI Hyperlipidemia- chronic problem, pt was hesitant to start statin, preferring the more natural route.  She started Cholester-Reg II at last visit.  Pt reports she has improved her diet and is exercising regularly.  Denies CP, SOB, HAs, visual changes, edema.   Review of Systems For ROS see HPI     Objective:   Physical Exam  Constitutional: She is oriented to person, place, and time. She appears well-developed and well-nourished. No distress.  HENT:  Head: Normocephalic and atraumatic.  Eyes: Conjunctivae and EOM are normal. Pupils are equal, round, and reactive to light.  Neck: Normal range of motion. Neck supple. No thyromegaly present.  Cardiovascular: Normal rate, regular rhythm, normal heart sounds and intact distal pulses.   No murmur heard. Pulmonary/Chest: Effort normal and breath sounds normal. No respiratory distress.  Abdominal: Soft. She exhibits no distension. There is no tenderness.  Musculoskeletal: She exhibits no edema.  Lymphadenopathy:    She has no cervical adenopathy.  Neurological: She is alert and oriented to person, place, and time.  Skin: Skin is warm and dry.  Psychiatric: She has a normal mood and affect. Her behavior is normal.  Vitals reviewed.         Assessment & Plan:

## 2014-08-10 NOTE — Patient Instructions (Signed)
Schedule your complete physical in 6 months We'll notify you of your lab results and make any changes if needed Keep up the good work on healthy diet and regular exercise- you can do this! Keep a food log to help monitor your progress (or use MyFitnessPal free app to help w/ this) Call with any questions or concerns Have a safe trip!!

## 2014-08-10 NOTE — Progress Notes (Signed)
Pre visit review using our clinic review tool, if applicable. No additional management support is needed unless otherwise documented below in the visit note. 

## 2014-08-10 NOTE — Assessment & Plan Note (Signed)
Chronic problem.  Pt is exercising regularly and reports dietary changes but has not achieved weight loss at this time.  On OTC natural cholesterol product.  Check labs.  Start meds prn.  Will follow closely.

## 2014-08-15 ENCOUNTER — Telehealth: Payer: Self-pay | Admitting: Family Medicine

## 2014-08-15 NOTE — Telephone Encounter (Signed)
Relation to XG:XIVH  Call back number: [phone]   Reason for call:  Pt inquiring about lab results taken 08/10/14

## 2014-08-15 NOTE — Telephone Encounter (Signed)
Called pt and left a detailed message with lab results and also informed that Dr.Tabori had released them to her mychart account.

## 2014-08-16 NOTE — Telephone Encounter (Signed)
Patient does not know how to get into mychart and prefers to be called. Best # [phone]

## 2014-08-16 NOTE — Telephone Encounter (Signed)
Called pt and Courtney Guzman

## 2015-01-02 ENCOUNTER — Other Ambulatory Visit: Payer: Self-pay | Admitting: Gynecology

## 2015-01-15 ENCOUNTER — Telehealth: Payer: Self-pay | Admitting: Family Medicine

## 2015-01-15 NOTE — Telephone Encounter (Signed)
Fine with me, just make her aware that I do not manage fibromyalgia.

## 2015-01-15 NOTE — Telephone Encounter (Signed)
Ok for transfer? 

## 2015-01-15 NOTE — Telephone Encounter (Signed)
Pt wanted to transfer care from dr Birdie Riddle to [facility]  Due to be closer to home Is it ok to schedule

## 2015-01-21 NOTE — Telephone Encounter (Signed)
Left message asking pt to call office  °

## 2015-01-23 NOTE — Telephone Encounter (Signed)
Patient returned Robin's call.  Patient said Dr.Deveshwar manages her fibromyalgia.  Patient said she'll call back to schedule appointment with [facility].

## 2015-03-01 ENCOUNTER — Encounter: Payer: Self-pay | Admitting: Internal Medicine

## 2015-03-01 ENCOUNTER — Ambulatory Visit (INDEPENDENT_AMBULATORY_CARE_PROVIDER_SITE_OTHER): Payer: 59 | Admitting: Internal Medicine

## 2015-03-01 VITALS — BP 142/96 | HR 83 | Temp 98.7°F | Ht 61.0 in | Wt 180.0 lb

## 2015-03-01 DIAGNOSIS — R030 Elevated blood-pressure reading, without diagnosis of hypertension: Secondary | ICD-10-CM | POA: Diagnosis not present

## 2015-03-01 DIAGNOSIS — K219 Gastro-esophageal reflux disease without esophagitis: Secondary | ICD-10-CM | POA: Diagnosis not present

## 2015-03-01 DIAGNOSIS — IMO0001 Reserved for inherently not codable concepts without codable children: Secondary | ICD-10-CM

## 2015-03-01 DIAGNOSIS — M797 Fibromyalgia: Secondary | ICD-10-CM | POA: Diagnosis not present

## 2015-03-01 DIAGNOSIS — E785 Hyperlipidemia, unspecified: Secondary | ICD-10-CM | POA: Diagnosis not present

## 2015-03-01 NOTE — Patient Instructions (Signed)

## 2015-03-01 NOTE — Assessment & Plan Note (Signed)
Will continue to monitor.

## 2015-03-01 NOTE — Progress Notes (Signed)
Pre visit review using our clinic review tool, if applicable. No additional management support is needed unless otherwise documented below in the visit note. 

## 2015-03-01 NOTE — Assessment & Plan Note (Addendum)
She is trying to control with diet Advised her to consume a low fat diet She declines lipid profile today, reports she just had her labs done at Dr. August Luz, will fax copy

## 2015-03-01 NOTE — Progress Notes (Signed)
HPI  Pt presents to the clinic today to establish care. She is transferring care from Dr. Birdie Riddle.  Fibromyalgia: She follows with Dr. August Luz. She takes Flexeril at night to help her sleep. She takes Robaxin during the day, because that doesn't make her sleepy, and she can work. She takes the Gabapentin as prescribed. She takes Voltaren gel and Aleve as needed. She reports her pain is well controlled.  HLD: Her last LDL was 137. She is not on any cholesterol lowering medication. She does take Fish Oil daily. She does try to consume a low fat diet.  GERD:Triggered by spicy, fried and fatty foods. She also notices it when she eats late at night. Symptoms well controlled on Prilosec.   Elevated blood pressure: BP elevated on 142/76. She has never had had high blood pressure. Her BP normally runs 120/70. She denies chest pain or shortness of breath.  Flu: 01/2014, gets at work Tetanus: 03/2013 Pap Smear: 04/2014 Mammogram: 04/2014 Colon Screening: 04/2009 Vision Screening: yearly, Dr. Idolina Primer Dentist: biannually  Past Medical History  Diagnosis Date  . Arthritis   . GERD (gastroesophageal reflux disease)   . Fibromyalgia     dr. Marveen Reeks  . Hyperlipidemia     Current Outpatient Prescriptions  Medication Sig Dispense Refill  . albuterol (VENTOLIN HFA) 108 (90 BASE) MCG/ACT inhaler INHALE 2 PUFFS INTO THE LUNGS EVERY 4 (FOUR) HOURS AS NEEDED FOR WHEEZING. 18 each 3  . Ascorbic Acid (VITAMIN C) 100 MG tablet Take 100 mg by mouth daily.    . calcium carbonate (OS-CAL) 600 MG TABS Take 600 mg by mouth 2 (two) times daily with a meal.      . co-enzyme Q-10 30 MG capsule Take 30 mg by mouth 3 (three) times daily.    . cyclobenzaprine (FLEXERIL) 10 MG tablet Take 10 mg by mouth 3 (three) times daily as needed.      . ferrous sulfate 325 (65 FE) MG tablet Take 325 mg by mouth daily with breakfast.    . fexofenadine (ALLEGRA) 30 MG tablet Take 30 mg by mouth 2 (two) times daily.    . fish  oil-omega-3 fatty acids 1000 MG capsule Take 2 g by mouth daily.    Marland Kitchen gabapentin (NEURONTIN) 300 MG capsule Take 300 mg by mouth 3 (three) times daily.      Nyoka Cowden Tea, Camillia sinensis, (GREEN TEA PO) Take by mouth.      . magnesium 30 MG tablet Take 30 mg by mouth 2 (two) times daily.    . methocarbamol (ROBAXIN) 500 MG tablet     . Methylsulfonylmethane (MSM) 500 MG CAPS Take by mouth.    Marland Kitchen omeprazole (PRILOSEC) 40 MG capsule TAKE 1 CAPSULE (40 MG TOTAL) BY MOUTH DAILY. 30 capsule 3  . OVER THE COUNTER MEDICATION Curamin, and Cholester Reg II    . TRAVATAN Z 0.004 % SOLN ophthalmic solution     . VOLTAREN 1 % GEL      No current facility-administered medications for this visit.    No Known Allergies  Family History  Problem Relation Age of Onset  . Hypertension Mother   . Diabetes Sister   . Cancer Maternal Grandmother     OVARIAN    Social History   Social History  . Marital Status: Married    Spouse Name: N/A  . Number of Children: N/A  . Years of Education: N/A   Occupational History  . Not on file.   Social History Main  Topics  . Smoking status: Never Smoker   . Smokeless tobacco: Never Used  . Alcohol Use: No  . Drug Use: No  . Sexual Activity:    Partners: Male    Birth Control/ Protection: Surgical     Comment: TUBAL LIGATION, 1st sexual encounter at 40, has had 5    Other Topics Concern  . Not on file   Social History Narrative    ROS:  Constitutional: Denies fever, malaise, fatigue, headache or abrupt weight changes.  Respiratory: Denies difficulty breathing, shortness of breath, cough or sputum production.   Cardiovascular: Denies chest pain, chest tightness, palpitations or swelling in the hands or feet.  Gastrointestinal: Denies abdominal pain, bloating, constipation, diarrhea or blood in the stool.  Musculoskeletal: Pt reports joint and muscle pain. Denies decrease in range of motion, difficulty with gait, or joint swelling.  Skin: Denies  redness, rashes, lesions or ulcercations.  Neurological: Denies dizziness, difficulty with memory, difficulty with speech or problems with balance and coordination.  Psych: Denies anxiety, depression, SI/HI.  No other specific complaints in a complete review of systems (except as listed in HPI above).  PE:  BP 142/96 mmHg  Pulse 83  Temp(Src) 98.7 F (37.1 C) (Oral)  Ht 5\' 1"  (1.549 m)  Wt 180 lb (81.647 kg)  BMI 34.03 kg/m2  SpO2 98%  LMP 02/24/2008  Wt Readings from Last 3 Encounters:  08/10/14 180 lb (81.647 kg)  05/10/14 179 lb (81.194 kg)  04/24/14 171 lb (77.565 kg)    General: Appears her stated age, obese in NAD.  Cardiovascular: Normal rate and rhythm. S1,S2 noted.  No murmur, rubs or gallops noted.  Pulmonary/Chest: Normal effort and positive vesicular breath sounds. No respiratory distress. No wheezes, rales or ronchi noted.  Abdomen: Soft and nontender. Normal bowel sounds. Musculoskeletal: Strength 5/5 BUE/BLE. No signs of joint swelling. No difficulty with gait.  Neurological: Alert and oriented.  Psychiatric: Mood and affect normal. Behavior is normal. Judgment and thought content normal.     BMET    Component Value Date/Time   NA 140 08/10/2014 0857   K 4.1 08/10/2014 0857   CL 107 08/10/2014 0857   CO2 29 08/10/2014 0857   GLUCOSE 107* 08/10/2014 0857   BUN 11 08/10/2014 0857   CREATININE 0.72 08/10/2014 0857   CREATININE 0.66 04/24/2014 0859   CALCIUM 10.0 08/10/2014 0857    Lipid Panel     Component Value Date/Time   CHOL 213* 08/10/2014 0857   TRIG 107.0 08/10/2014 0857   HDL 54.70 08/10/2014 0857   CHOLHDL 4 08/10/2014 0857   VLDL 21.4 08/10/2014 0857   LDLCALC 137* 08/10/2014 0857    CBC    Component Value Date/Time   WBC 7.5 04/24/2014 0859   RBC 4.64 04/24/2014 0859   HGB 12.9 04/24/2014 0859   HCT 39.6 04/24/2014 0859   PLT 381 04/24/2014 0859   MCV 85.3 04/24/2014 0859   MCH 27.8 04/24/2014 0859   MCHC 32.6 04/24/2014 0859    RDW 14.8 04/24/2014 0859   LYMPHSABS 4.5* 04/24/2014 0859   MONOABS 0.5 04/24/2014 0859   EOSABS 0.1 04/24/2014 0859   BASOSABS 0.0 04/24/2014 0859    Hgb A1C No results found for: HGBA1C   Assessment and Plan:

## 2015-03-01 NOTE — Assessment & Plan Note (Signed)
Discussed how weight loss could improve her symptoms Continue to avoid triggers Continue Prilosec

## 2015-03-01 NOTE — Assessment & Plan Note (Signed)
Mangaged by Dr. August Luz Continue current therapy at this time

## 2015-03-28 ENCOUNTER — Other Ambulatory Visit: Payer: Self-pay

## 2015-03-28 DIAGNOSIS — Z1231 Encounter for screening mammogram for malignant neoplasm of breast: Secondary | ICD-10-CM

## 2015-04-21 HISTORY — PX: COLONOSCOPY: SHX174

## 2015-04-27 ENCOUNTER — Other Ambulatory Visit: Payer: Self-pay | Admitting: Gynecology

## 2015-04-29 ENCOUNTER — Ambulatory Visit: Payer: 59

## 2015-04-29 NOTE — Telephone Encounter (Signed)
Needs RGCE

## 2015-04-30 ENCOUNTER — Encounter: Payer: 59 | Admitting: Family Medicine

## 2015-05-06 ENCOUNTER — Ambulatory Visit
Admission: RE | Admit: 2015-05-06 | Discharge: 2015-05-06 | Disposition: A | Discharge status: Home / Self Care | Payer: 59 | Source: Ambulatory Visit

## 2015-05-06 DIAGNOSIS — Z1231 Encounter for screening mammogram for malignant neoplasm of breast: Secondary | ICD-10-CM

## 2015-05-11 ENCOUNTER — Encounter: Payer: Self-pay | Admitting: Gastroenterology

## 2015-06-04 ENCOUNTER — Encounter: Payer: Self-pay | Admitting: Internal Medicine

## 2015-06-04 ENCOUNTER — Ambulatory Visit (INDEPENDENT_AMBULATORY_CARE_PROVIDER_SITE_OTHER): Payer: 59 | Admitting: Internal Medicine

## 2015-06-04 ENCOUNTER — Telehealth: Payer: Self-pay

## 2015-06-04 VITALS — BP 138/96 | HR 82 | Temp 98.9°F | Ht 61.0 in | Wt 184.0 lb

## 2015-06-04 DIAGNOSIS — K121 Other forms of stomatitis: Secondary | ICD-10-CM | POA: Diagnosis not present

## 2015-06-04 DIAGNOSIS — J4520 Mild intermittent asthma, uncomplicated: Secondary | ICD-10-CM

## 2015-06-04 DIAGNOSIS — Z0000 Encounter for general adult medical examination without abnormal findings: Secondary | ICD-10-CM

## 2015-06-04 LAB — COMPREHENSIVE METABOLIC PANEL
ALT: 16 U/L (ref 0–35)
AST: 20 U/L (ref 0–37)
Albumin: 4.4 g/dL (ref 3.5–5.2)
Alkaline Phosphatase: 91 U/L (ref 39–117)
BUN: 13 mg/dL (ref 6–23)
CO2: 29 mEq/L (ref 19–32)
Calcium: 9.6 mg/dL (ref 8.4–10.5)
Chloride: 105 mEq/L (ref 96–112)
Creatinine, Ser: 0.64 mg/dL (ref 0.40–1.20)
GFR: 123.74 mL/min (ref >60.00)
Glucose, Bld: 119 mg/dL — ABNORMAL HIGH (ref 70–99)
Potassium: 4 mEq/L (ref 3.5–5.1)
Sodium: 139 mEq/L (ref 135–145)
Total Bilirubin: 0.4 mg/dL (ref 0.2–1.2)
Total Protein: 7.5 g/dL (ref 6.0–8.3)

## 2015-06-04 LAB — CBC
HCT: 37.8 % (ref 36.0–46.0)
Hemoglobin: 12.6 g/dL (ref 12.0–15.0)
MCHC: 33.2 g/dL (ref 30.0–36.0)
MCV: 85.1 fl (ref 78.0–100.0)
Platelets: 337 10*3/uL (ref 150.0–400.0)
RBC: 4.45 Mil/uL (ref 3.87–5.11)
RDW: 14.5 % (ref 11.5–15.5)
WBC: 8.5 10*3/uL (ref 4.0–10.5)

## 2015-06-04 LAB — LIPID PANEL
Cholesterol: 228 mg/dL — ABNORMAL HIGH (ref 0–200)
HDL: 62.7 mg/dL (ref >39.00)
LDL Cholesterol: 145 mg/dL — ABNORMAL HIGH (ref 0–99)
NonHDL: 165
Total CHOL/HDL Ratio: 4
Triglycerides: 98 mg/dL (ref 0.0–149.0)
VLDL: 19.6 mg/dL (ref 0.0–40.0)

## 2015-06-04 LAB — HEMOGLOBIN A1C: Hgb A1c MFr Bld: 6.7 % — ABNORMAL HIGH (ref 4.6–6.5)

## 2015-06-04 MED ORDER — MAGIC MOUTHWASH W/LIDOCAINE
5.0000 mL | Freq: Four times a day (QID) | ORAL | Status: DC | PRN
Start: 2015-06-04 — End: 2016-03-03

## 2015-06-04 MED ORDER — ALBUTEROL SULFATE HFA 108 (90 BASE) MCG/ACT IN AERS: INHALATION_SPRAY | RESPIRATORY_TRACT | Status: DC

## 2015-06-04 NOTE — Progress Notes (Signed)
Pre visit review using our clinic review tool, if applicable. No additional management support is needed unless otherwise documented below in the visit note. 

## 2015-06-04 NOTE — Telephone Encounter (Signed)
Rx sent through e-scribe  

## 2015-06-04 NOTE — Telephone Encounter (Signed)
Courtney Guzman forgot to mention that she needed a refill on her inhaler. Could you send that in for her?

## 2015-06-04 NOTE — Patient Instructions (Signed)
Health Maintenance, Female Adopting a healthy lifestyle and getting preventive care can go a long way to promote health and wellness. Talk with your health care provider about what schedule of regular examinations is right for you. This is a good chance for you to check in with your provider about disease prevention and staying healthy. In between checkups, there are plenty of things you can do on your own. Experts have done a lot of research about which lifestyle changes and preventive measures are most likely to keep you healthy. Ask your health care provider for more information. WEIGHT AND DIET  Eat a healthy diet  Be sure to include plenty of vegetables, fruits, low-fat dairy products, and lean protein.  Do not eat a lot of foods high in solid fats, added sugars, or salt.  Get regular exercise. This is one of the most important things you can do for your health.  Most adults should exercise for at least 150 minutes each week. The exercise should increase your heart rate and make you sweat (moderate-intensity exercise).  Most adults should also do strengthening exercises at least twice a week. This is in addition to the moderate-intensity exercise.  Maintain a healthy weight  Body mass index (BMI) is a measurement that can be used to identify possible weight problems. It estimates body fat based on height and weight. Your health care provider can help determine your BMI and help you achieve or maintain a healthy weight.  For females 20 years of age and older:   A BMI below 18.5 is considered underweight.  A BMI of 18.5 to 24.9 is normal.  A BMI of 25 to 29.9 is considered overweight.  A BMI of 30 and above is considered obese.  Watch levels of cholesterol and blood lipids  You should start having your blood tested for lipids and cholesterol at 56 years of age, then have this test every 5 years.  You may need to have your cholesterol levels checked more often if:  Your lipid  or cholesterol levels are high.  You are older than 56 years of age.  You are at high risk for heart disease.  CANCER SCREENING   Lung Cancer  Lung cancer screening is recommended for adults 55-80 years old who are at high risk for lung cancer because of a history of smoking.  A yearly low-dose CT scan of the lungs is recommended for people who:  Currently smoke.  Have quit within the past 15 years.  Have at least a 30-pack-year history of smoking. A pack year is smoking an average of one pack of cigarettes a day for 1 year.  Yearly screening should continue until it has been 15 years since you quit.  Yearly screening should stop if you develop a health problem that would prevent you from having lung cancer treatment.  Breast Cancer  Practice breast self-awareness. This means understanding how your breasts normally appear and feel.  It also means doing regular breast self-exams. Let your health care provider know about any changes, no matter how small.  If you are in your 20s or 30s, you should have a clinical breast exam (CBE) by a health care provider every 1-3 years as part of a regular health exam.  If you are 40 or older, have a CBE every year. Also consider having a breast X-ray (mammogram) every year.  If you have a family history of breast cancer, talk to your health care provider about genetic screening.  If you   are at high risk for breast cancer, talk to your health care provider about having an MRI and a mammogram every year.  Breast cancer gene (BRCA) assessment is recommended for women who have family members with BRCA-related cancers. BRCA-related cancers include:  Breast.  Ovarian.  Tubal.  Peritoneal cancers.  Results of the assessment will determine the need for genetic counseling and BRCA1 and BRCA2 testing. Cervical Cancer Your health care provider may recommend that you be screened regularly for cancer of the pelvic organs (ovaries, uterus, and  vagina). This screening involves a pelvic examination, including checking for microscopic changes to the surface of your cervix (Pap test). You may be encouraged to have this screening done every 3 years, beginning at age 21.  For women ages 30-65, health care providers may recommend pelvic exams and Pap testing every 3 years, or they may recommend the Pap and pelvic exam, combined with testing for human papilloma virus (HPV), every 5 years. Some types of HPV increase your risk of cervical cancer. Testing for HPV may also be done on women of any age with unclear Pap test results.  Other health care providers may not recommend any screening for nonpregnant women who are considered low risk for pelvic cancer and who do not have symptoms. Ask your health care provider if a screening pelvic exam is right for you.  If you have had past treatment for cervical cancer or a condition that could lead to cancer, you need Pap tests and screening for cancer for at least 20 years after your treatment. If Pap tests have been discontinued, your risk factors (such as having a new sexual partner) need to be reassessed to determine if screening should resume. Some women have medical problems that increase the chance of getting cervical cancer. In these cases, your health care provider may recommend more frequent screening and Pap tests. Colorectal Cancer  This type of cancer can be detected and often prevented.  Routine colorectal cancer screening usually begins at 56 years of age and continues through 56 years of age.  Your health care provider may recommend screening at an earlier age if you have risk factors for colon cancer.  Your health care provider may also recommend using home test kits to check for hidden blood in the stool.  A small camera at the end of a tube can be used to examine your colon directly (sigmoidoscopy or colonoscopy). This is done to check for the earliest forms of colorectal  cancer.  Routine screening usually begins at age 50.  Direct examination of the colon should be repeated every 5-10 years through 56 years of age. However, you may need to be screened more often if early forms of precancerous polyps or small growths are found. Skin Cancer  Check your skin from head to toe regularly.  Tell your health care provider about any new moles or changes in moles, especially if there is a change in a mole's shape or color.  Also tell your health care provider if you have a mole that is larger than the size of a pencil eraser.  Always use sunscreen. Apply sunscreen liberally and repeatedly throughout the day.  Protect yourself by wearing long sleeves, pants, a wide-brimmed hat, and sunglasses whenever you are outside. HEART DISEASE, DIABETES, AND HIGH BLOOD PRESSURE   High blood pressure causes heart disease and increases the risk of stroke. High blood pressure is more likely to develop in:  People who have blood pressure in the high end   of the normal range (130-139/85-89 mm Hg).  People who are overweight or obese.  People who are African American.  If you are 38-23 years of age, have your blood pressure checked every 3-5 years. If you are 61 years of age or older, have your blood pressure checked every year. You should have your blood pressure measured twice--once when you are at a hospital or clinic, and once when you are not at a hospital or clinic. Record the average of the two measurements. To check your blood pressure when you are not at a hospital or clinic, you can use:  An automated blood pressure machine at a pharmacy.  A home blood pressure monitor.  If you are between 45 years and 39 years old, ask your health care provider if you should take aspirin to prevent strokes.  Have regular diabetes screenings. This involves taking a blood sample to check your fasting blood sugar level.  If you are at a normal weight and have a low risk for diabetes,  have this test once every three years after 56 years of age.  If you are overweight and have a high risk for diabetes, consider being tested at a younger age or more often. PREVENTING INFECTION  Hepatitis B  If you have a higher risk for hepatitis B, you should be screened for this virus. You are considered at high risk for hepatitis B if:  You were born in a country where hepatitis B is common. Ask your health care provider which countries are considered high risk.  Your parents were born in a high-risk country, and you have not been immunized against hepatitis B (hepatitis B vaccine).  You have HIV or AIDS.  You use needles to inject street drugs.  You live with someone who has hepatitis B.  You have had sex with someone who has hepatitis B.  You get hemodialysis treatment.  You take certain medicines for conditions, including cancer, organ transplantation, and autoimmune conditions. Hepatitis C  Blood testing is recommended for:  Everyone born from [birth year] through 1966.  Anyone with known risk factors for hepatitis C. Sexually transmitted infections (STIs)  You should be screened for sexually transmitted infections (STIs) including gonorrhea and chlamydia if:  You are sexually active and are younger than 56 years of age.  You are older than 56 years of age and your health care provider tells you that you are at risk for this type of infection.  Your sexual activity has changed since you were last screened and you are at an increased risk for chlamydia or gonorrhea. Ask your health care provider if you are at risk.  If you do not have HIV, but are at risk, it may be recommended that you take a prescription medicine daily to prevent HIV infection. This is called pre-exposure prophylaxis (PrEP). You are considered at risk if:  You are sexually active and do not regularly use condoms or know the HIV status of your partner(s).  You take drugs by injection.  You are sexually  active with a partner who has HIV. Talk with your health care provider about whether you are at high risk of being infected with HIV. If you choose to begin PrEP, you should first be tested for HIV. You should then be tested every 3 months for as long as you are taking PrEP.  PREGNANCY   If you are premenopausal and you may become pregnant, ask your health care provider about preconception counseling.  If you may  become pregnant, take 400 to 800 micrograms (mcg) of folic acid every day.  If you want to prevent pregnancy, talk to your health care provider about birth control (contraception). OSTEOPOROSIS AND MENOPAUSE   Osteoporosis is a disease in which the bones lose minerals and strength with aging. This can result in serious bone fractures. Your risk for osteoporosis can be identified using a bone density scan.  If you are 61 years of age or older, or if you are at risk for osteoporosis and fractures, ask your health care provider if you should be screened.  Ask your health care provider whether you should take a calcium or vitamin D supplement to lower your risk for osteoporosis.  Menopause may have certain physical symptoms and risks.  Hormone replacement therapy may reduce some of these symptoms and risks. Talk to your health care provider about whether hormone replacement therapy is right for you.  HOME CARE INSTRUCTIONS   Schedule regular health, dental, and eye exams.  Stay current with your immunizations.   Do not use any tobacco products including cigarettes, chewing tobacco, or electronic cigarettes.  If you are pregnant, do not drink alcohol.  If you are breastfeeding, limit how much and how often you drink alcohol.  Limit alcohol intake to no more than 1 drink per day for nonpregnant women. One drink equals 12 ounces of beer, 5 ounces of wine, or 1 ounces of hard liquor.  Do not use street drugs.  Do not share needles.  Ask your health care provider for help if  you need support or information about quitting drugs.  Tell your health care provider if you often feel depressed.  Tell your health care provider if you have ever been abused or do not feel safe at home.   This information is not intended to replace advice given to you by your health care provider. Make sure you discuss any questions you have with your health care provider.   Document Released: 10/20/2010 Document Revised: 04/27/2014 Document Reviewed: 03/08/2013 Elsevier Interactive Patient Education [employer].

## 2015-06-04 NOTE — Progress Notes (Signed)
Subjective:    Patient ID: Courtney Guzman, female    DOB: 06/23/1960, 55 y.o.   MRN: [id number]  HPI  Pt presents to the clinic today for her annual exam.  Flu: 01/2015 Tetanus: 04/11/2013 Pap Smear: 04/2014 Mammogram: 05/06/15 Colon Screening: 04/2009, she is scheduling it for this year Vision Screening: Yearly at Dr. Idolina Primer Dentist: biannually  Diet: She does eat lean meat. She eats more fruits than vegetables. She does consume some fried foods. She drinks mostly water, sweet tea and juice. Exercise: None  She does c/o pain in her right lower gums. This started 2 days ago. She has dentures. She denies fever, chills or body aches. She has gargled with salt water and tried Orajel without any relief.  Review of Systems      Past Medical History  Diagnosis Date  . Arthritis   . GERD (gastroesophageal reflux disease)   . Fibromyalgia     dr. Marveen Reeks  . Hyperlipidemia     Current Outpatient Prescriptions  Medication Sig Dispense Refill  . albuterol (VENTOLIN HFA) 108 (90 BASE) MCG/ACT inhaler INHALE 2 PUFFS INTO THE LUNGS EVERY 4 (FOUR) HOURS AS NEEDED FOR WHEEZING. 18 each 3  . Ascorbic Acid (VITAMIN C) 100 MG tablet Take 100 mg by mouth daily.    . calcium carbonate (OS-CAL) 600 MG TABS Take 600 mg by mouth 2 (two) times daily with a meal.      . cyclobenzaprine (FLEXERIL) 10 MG tablet Take 10 mg by mouth 3 (three) times daily as needed.      . ferrous sulfate 325 (65 FE) MG tablet Take 325 mg by mouth daily with breakfast.    . fexofenadine (ALLEGRA) 30 MG tablet Take 30 mg by mouth 2 (two) times daily.    . fish oil-omega-3 fatty acids 1000 MG capsule Take 2 g by mouth daily.    Marland Kitchen gabapentin (NEURONTIN) 300 MG capsule Take 300 mg by mouth 3 (three) times daily.      Nyoka Cowden Tea, Camillia sinensis, (GREEN TEA PO) Take by mouth.      . magnesium 30 MG tablet Take 30 mg by mouth 2 (two) times daily.    . methocarbamol (ROBAXIN) 500 MG tablet     . Methylsulfonylmethane (MSM)  500 MG CAPS Take by mouth.    Marland Kitchen omeprazole (PRILOSEC) 40 MG capsule TAKE 1 CAPSULE (40 MG TOTAL) BY MOUTH DAILY. 30 capsule 0  . OVER THE COUNTER MEDICATION Curamin, and Cholester Reg II    . TRAVATAN Z 0.004 % SOLN ophthalmic solution     . VOLTAREN 1 % GEL      No current facility-administered medications for this visit.    No Known Allergies  Family History  Problem Relation Age of Onset  . Hypertension Mother   . Diabetes Sister   . Cancer Maternal Grandmother     OVARIAN    Social History   Social History  . Marital Status: Married    Spouse Name: N/A  . Number of Children: N/A  . Years of Education: N/A   Occupational History  . Not on file.   Social History Main Topics  . Smoking status: Never Smoker   . Smokeless tobacco: Never Used  . Alcohol Use: No  . Drug Use: No  . Sexual Activity:    Partners: Male    Birth Control/ Protection: Surgical     Comment: TUBAL LIGATION, 1st sexual encounter at 67, has had 5    Other  Topics Concern  . Not on file   Social History Narrative     Constitutional: Denies fever, malaise, fatigue, headache or abrupt weight changes.  HEENT: Pt reports sore gums.  Denies eye pain, eye redness, ear pain, ringing in the ears, wax buildup, runny nose, nasal congestion, bloody nose, or sore throat. Respiratory: Denies difficulty breathing, shortness of breath, cough or sputum production.   Cardiovascular: Denies chest pain, chest tightness, palpitations or swelling in the hands or feet.  Gastrointestinal: Denies abdominal pain, bloating, constipation, diarrhea or blood in the stool.  GU: Denies urgency, frequency, pain with urination, burning sensation, blood in urine, odor or discharge. Musculoskeletal: Pt reports muscle pain. Denies decrease in range of motion, difficulty with gait, or joint pain and swelling.  Skin: Denies redness, rashes, lesions or ulcercations.  Neurological: Denies dizziness, difficulty with memory, difficulty  with speech or problems with balance and coordination.  Psych: Denies anxiety, depression, SI/HI.  No other specific complaints in a complete review of systems (except as listed in HPI above).  Objective:   Physical Exam  BP 138/96 mmHg  Pulse 82  Temp(Src) 98.9 F (37.2 C) (Oral)  Ht 5\' 1"  (1.549 m)  Wt 184 lb (83.462 kg)  BMI 34.78 kg/m2  SpO2 98%  LMP 02/24/2008 Wt Readings from Last 3 Encounters:  06/04/15 184 lb (83.462 kg)  03/01/15 180 lb (81.647 kg)  08/10/14 180 lb (81.647 kg)    General: Appears her stated age, well developed, well nourished in NAD. Skin: Warm, dry and intact. No rashes, lesions or ulcerations noted. HEENT: Head: normal shape and size; Eyes: sclera white, no icterus, conjunctiva pink, PERRLA and EOMs intact; Ears: Tm's gray and intact, normal light reflex; Throat/Mouth: Teeth present, mucosa pink and moist, no exudate, lesions noted. Ulcer noted underneath partial, right lower gumline. Neck:  Neck supple, trachea midline. No masses, lumps or thyromegaly present.  Cardiovascular: Normal rate and rhythm. S1,S2 noted.  No murmur, rubs or gallops noted. No JVD or BLE edema. No carotid bruits noted. Pulmonary/Chest: Normal effort and positive vesicular breath sounds. No respiratory distress. No wheezes, rales or ronchi noted.  Abdomen: Soft and nontender. Normal bowel sounds. No distention or masses noted. Liver, spleen and kidneys non palpable. Musculoskeletal: Normal range of motion. Strength 5/5 BUE/BLE. No difficulty with gait.  Neurological: Alert and oriented. Cranial nerves II-XII grossly intact. Coordination normal.  Psychiatric: Mood and affect normal. Behavior is normal. Judgment and thought content normal.     BMET    Component Value Date/Time   NA 140 08/10/2014 0857   K 4.1 08/10/2014 0857   CL 107 08/10/2014 0857   CO2 29 08/10/2014 0857   GLUCOSE 107* 08/10/2014 0857   BUN 11 08/10/2014 0857   CREATININE 0.72 08/10/2014 0857    CREATININE 0.66 04/24/2014 0859   CALCIUM 10.0 08/10/2014 0857    Lipid Panel     Component Value Date/Time   CHOL 213* 08/10/2014 0857   TRIG 107.0 08/10/2014 0857   HDL 54.70 08/10/2014 0857   CHOLHDL 4 08/10/2014 0857   VLDL 21.4 08/10/2014 0857   LDLCALC 137* 08/10/2014 0857    CBC    Component Value Date/Time   WBC 7.5 04/24/2014 0859   RBC 4.64 04/24/2014 0859   HGB 12.9 04/24/2014 0859   HCT 39.6 04/24/2014 0859   PLT 381 04/24/2014 0859   MCV 85.3 04/24/2014 0859   MCH 27.8 04/24/2014 0859   MCHC 32.6 04/24/2014 0859   RDW 14.8 04/24/2014 0859  LYMPHSABS 4.5* 04/24/2014 0859   MONOABS 0.5 04/24/2014 0859   EOSABS 0.1 04/24/2014 0859   BASOSABS 0.0 04/24/2014 0859    Hgb A1C No results found for: HGBA1C       Assessment & Plan:   Preventative Health Maintenance:  Flu and Tetanus shot UTD Pap Smear and Mammogram UTD Colon Screening UTD, due Encouraged her to continue to see an eye doctor and dentist at least annually Encouraged her to consume a healthy diet and exercise regimen Will check CBC, CMET, Lipid and A1C today  Oral ulcer:  Appears to be from poor fitting partial She will follow up with this with her dentist eRx for Magic Mouthwash with Lidocaine  RTC in 6 months to follow up chronic conditons

## 2015-06-14 ENCOUNTER — Ambulatory Visit (INDEPENDENT_AMBULATORY_CARE_PROVIDER_SITE_OTHER): Payer: 59 | Admitting: Internal Medicine

## 2015-06-14 ENCOUNTER — Encounter: Payer: Self-pay | Admitting: Internal Medicine

## 2015-06-14 VITALS — BP 132/86 | HR 92 | Temp 98.6°F | Wt 181.0 lb

## 2015-06-14 DIAGNOSIS — E785 Hyperlipidemia, unspecified: Secondary | ICD-10-CM

## 2015-06-14 DIAGNOSIS — E119 Type 2 diabetes mellitus without complications: Secondary | ICD-10-CM

## 2015-06-14 NOTE — Assessment & Plan Note (Signed)
Encouraged her to consume a low fat, low carb diet Continue Fish Oil Will repeat Lipid Profile in 3 months. If LDL not < 100, will consider starting statin therapy

## 2015-06-14 NOTE — Progress Notes (Signed)
Subjective:    Patient ID: Courtney Guzman, female    DOB: 1960-12-05, 55 y.o.   MRN: [id number]  HPI  Pt presents to the clinic today to follow up her labs. Her A1C was 6.7%. She has a family history of diabetes but has never been told that she has diabetes. Her LDL was 145, triglycerides 98. She is taking Fish Oil OTC but is not taking any prescription cholesterol lowering medication. She denies blurred vision, increased thirst, urinary frequency. She does have numbness and tingling in her hands and feet but that is from her fibromyalgia.  Review of Systems      Past Medical History  Diagnosis Date  . Arthritis   . GERD (gastroesophageal reflux disease)   . Fibromyalgia     dr. Marveen Reeks  . Hyperlipidemia     Current Outpatient Prescriptions  Medication Sig Dispense Refill  . albuterol (VENTOLIN HFA) 108 (90 Base) MCG/ACT inhaler INHALE 2 PUFFS INTO THE LUNGS EVERY 4 (FOUR) HOURS AS NEEDED FOR WHEEZING. 18 each 5  . Ascorbic Acid (VITAMIN C) 100 MG tablet Take 100 mg by mouth daily.    . calcium carbonate (OS-CAL) 600 MG TABS Take 600 mg by mouth 2 (two) times daily with a meal.      . cyclobenzaprine (FLEXERIL) 10 MG tablet Take 10 mg by mouth 3 (three) times daily as needed.      . ferrous sulfate 325 (65 FE) MG tablet Take 325 mg by mouth daily with breakfast.    . fexofenadine (ALLEGRA) 30 MG tablet Take 30 mg by mouth 2 (two) times daily.    . fish oil-omega-3 fatty acids 1000 MG capsule Take 2 g by mouth daily.    Marland Kitchen gabapentin (NEURONTIN) 300 MG capsule Take 300 mg by mouth 3 (three) times daily.      Nyoka Cowden Tea, Camillia sinensis, (GREEN TEA PO) Take by mouth.      . magic mouthwash w/lidocaine SOLN Take 5 mLs by mouth 4 (four) times daily as needed for mouth pain. 180 mL 0  . magnesium 30 MG tablet Take 30 mg by mouth 2 (two) times daily.    . methocarbamol (ROBAXIN) 500 MG tablet     . Methylsulfonylmethane (MSM) 500 MG CAPS Take by mouth.    Marland Kitchen omeprazole (PRILOSEC) 40  MG capsule TAKE 1 CAPSULE (40 MG TOTAL) BY MOUTH DAILY. 30 capsule 0  . OVER THE COUNTER MEDICATION Curamin, and Cholester Reg II    . TRAVATAN Z 0.004 % SOLN ophthalmic solution     . VOLTAREN 1 % GEL      No current facility-administered medications for this visit.    No Known Allergies  Family History  Problem Relation Age of Onset  . Hypertension Mother   . Diabetes Sister   . Cancer Maternal Grandmother     OVARIAN    Social History   Social History  . Marital Status: Married    Spouse Name: N/A  . Number of Children: N/A  . Years of Education: N/A   Occupational History  . Not on file.   Social History Main Topics  . Smoking status: Never Smoker   . Smokeless tobacco: Never Used  . Alcohol Use: No  . Drug Use: No  . Sexual Activity:    Partners: Male    Birth Control/ Protection: Surgical     Comment: TUBAL LIGATION, 1st sexual encounter at 89, has had 5    Other Topics Concern  .  Not on file   Social History Narrative     Constitutional: Denies fever, malaise, fatigue, headache or abrupt weight changes.  Respiratory: Denies difficulty breathing, shortness of breath, cough or sputum production.   Cardiovascular: Denies chest pain, chest tightness, palpitations or swelling in the hands or feet.  Gastrointestinal: Denies abdominal pain, bloating, constipation, diarrhea or blood in the stool.  GU: Denies urgency, frequency, pain with urination, burning sensation, blood in urine, odor or discharge. Skin: Denies redness, rashes, lesions or ulcercations.  Neurological: Pt reports numbness and tingling. Denies dizziness, difficulty with memory, difficulty with speech or problems with balance and coordination.    No other specific complaints in a complete review of systems (except as listed in HPI above).  Objective:   Physical Exam    BP 132/86 mmHg  Pulse 92  Temp(Src) 98.6 F (37 C) (Oral)  Wt 181 lb (82.101 kg)  SpO2 98%  LMP 02/24/2008 Wt  Readings from Last 3 Encounters:  06/14/15 181 lb (82.101 kg)  06/04/15 184 lb (83.462 kg)  03/01/15 180 lb (81.647 kg)    General: Appears her stated age, obese in NAD. Skin: Warm, dry and intact.  HEENT: Head: normal shape and size; Eyes: sclera white, no icterus, conjunctiva pink, PERRLA and EOMs intact;  Cardiovascular: Normal rate and rhythm. S1,S2 noted.  No murmur, rubs or gallops noted.  Pulmonary/Chest: Normal effort and positive vesicular breath sounds. No respiratory distress. No wheezes, rales or ronchi noted.  Abdomen: Soft and nontender. Normal bowel sounds Neurological: Alert and oriented.    BMET    Component Value Date/Time   NA 139 06/04/2015 0849   K 4.0 06/04/2015 0849   CL 105 06/04/2015 0849   CO2 29 06/04/2015 0849   GLUCOSE 119* 06/04/2015 0849   BUN 13 06/04/2015 0849   CREATININE 0.64 06/04/2015 0849   CREATININE 0.66 04/24/2014 0859   CALCIUM 9.6 06/04/2015 0849    Lipid Panel     Component Value Date/Time   CHOL 228* 06/04/2015 0849   TRIG 98.0 06/04/2015 0849   HDL 62.70 06/04/2015 0849   CHOLHDL 4 06/04/2015 0849   VLDL 19.6 06/04/2015 0849   LDLCALC 145* 06/04/2015 0849    CBC    Component Value Date/Time   WBC 8.5 06/04/2015 0849   RBC 4.45 06/04/2015 0849   HGB 12.6 06/04/2015 0849   HCT 37.8 06/04/2015 0849   PLT 337.0 06/04/2015 0849   MCV 85.1 06/04/2015 0849   MCH 27.8 04/24/2014 0859   MCHC 33.2 06/04/2015 0849   RDW 14.5 06/04/2015 0849   LYMPHSABS 4.5* 04/24/2014 0859   MONOABS 0.5 04/24/2014 0859   EOSABS 0.1 04/24/2014 0859   BASOSABS 0.0 04/24/2014 0859    Hgb A1C Lab Results  Component Value Date   HGBA1C 6.7* 06/04/2015       Assessment & Plan:

## 2015-06-14 NOTE — Patient Instructions (Signed)
Diabetes and Standards of Medical Care Diabetes is complicated. You may find that your diabetes team includes a dietitian, nurse, diabetes educator, eye doctor, and more. To help everyone know what is going on and to help you get the care you deserve, the following schedule of care was developed to help keep you on track. Below are the tests, exams, vaccines, medicines, education, and plans you will need. HbA1c test This test shows how well you have controlled your glucose over the past 2-3 months. It is used to see if your diabetes management plan needs to be adjusted.   It is performed at least 2 times a year if you are meeting treatment goals.  It is performed 4 times a year if therapy has changed or if you are not meeting treatment goals. Blood pressure test  This test is performed at every routine medical visit. The goal is less than 140/90 mm Hg for most people, but 130/80 mm Hg in some cases. Ask your health care provider about your goal. Dental exam  Follow up with the dentist regularly. Eye exam  If you are diagnosed with type 1 diabetes as a child, get an exam upon reaching the age of 80 years or older and having had diabetes for 3-5 years. Yearly eye exams are recommended after that initial eye exam.  If you are diagnosed with type 1 diabetes as an adult, get an exam within 5 years of diagnosis and then yearly.  If you are diagnosed with type 2 diabetes, get an exam as soon as possible after the diagnosis and then yearly. Foot care exam  Visual foot exams are performed at every routine medical visit. The exams check for cuts, injuries, or other problems with the feet.  You should have a complete foot exam performed every year. This exam includes an inspection of the structure and skin of your feet, a check of the pulses in your feet, and a check of the sensation in your feet.  Type 1 diabetes: The first exam is performed 5 years after diagnosis.  Type 2 diabetes: The first  exam is performed at the time of diagnosis.  Check your feet nightly for cuts, injuries, or other problems with your feet. Tell your health care provider if anything is not healing. Kidney function test (urine microalbumin)  This test is performed once a year.  Type 1 diabetes: The first test is performed 5 years after diagnosis.  Type 2 diabetes: The first test is performed at the time of diagnosis.  A serum creatinine and estimated glomerular filtration rate (eGFR) test is done once a year to assess the level of chronic kidney disease (CKD), if present. Lipid profile (cholesterol, HDL, LDL, triglycerides)  Performed every 5 years for most people.  The goal for LDL is less than 100 mg/dL. If you are at high risk, the goal is less than 70 mg/dL.  The goal for HDL is 40 mg/dL-50 mg/dL for men and 50 mg/dL-60 mg/dL for women. An HDL cholesterol of 60 mg/dL or higher gives some protection against heart disease.  The goal for triglycerides is less than 150 mg/dL. Immunizations  The flu (influenza) vaccine is recommended yearly for every person 30 months of age or older who has diabetes.  The pneumonia (pneumococcal) vaccine is recommended for every person 38 years of age or older who has diabetes. Adults 57 years of age or older may receive the pneumonia vaccine as a series of two separate shots.  The hepatitis B  vaccine is recommended for adults shortly after they have been diagnosed with diabetes.  The Tdap (tetanus, diphtheria, and pertussis) vaccine should be given:  According to normal childhood vaccination schedules, for children.  Every 10 years, for adults who have diabetes. Diabetes self-management education  Education is recommended at diagnosis and ongoing as needed. Treatment plan  Your treatment plan is reviewed at every medical visit.   This information is not intended to replace advice given to you by your health care provider. Make sure you discuss any questions you  have with your health care provider.   Document Released: 02/01/2009 Document Revised: 04/27/2014 Document Reviewed: 09/06/2012 Elsevier Interactive Patient Education 2016 [employer]  

## 2015-06-14 NOTE — Progress Notes (Signed)
Pre visit review using our clinic review tool, if applicable. No additional management support is needed unless otherwise documented below in the visit note. 

## 2015-06-14 NOTE — Assessment & Plan Note (Addendum)
Discussed diabetes and standards of medical care Discussed low carb diet and carb counting Encouraged yearly eye exam Foot exam today Will not start medication at this time, 3 months to try lifestyle change, low carb, low fat diet and exercise Flu shot UTD Will discuss pneumovax at her next visit Repeat A1C and microalbumin in 3 months

## 2015-06-18 ENCOUNTER — Telehealth: Payer: Self-pay | Admitting: Internal Medicine

## 2015-06-18 ENCOUNTER — Encounter: Payer: Self-pay | Admitting: Gastroenterology

## 2015-06-26 ENCOUNTER — Telehealth: Payer: Self-pay | Admitting: Internal Medicine

## 2015-06-26 NOTE — Telephone Encounter (Signed)
Pt called she has ? About dm and checking her sugar level Pt would like a call back

## 2015-06-27 NOTE — Telephone Encounter (Signed)
Spoke to pt on DM education

## 2015-07-16 NOTE — Telephone Encounter (Signed)
A user error has taken place.

## 2015-07-19 ENCOUNTER — Ambulatory Visit (AMBULATORY_SURGERY_CENTER): Payer: Self-pay | Admitting: *Deleted

## 2015-07-19 VITALS — Ht 60.0 in | Wt 174.0 lb

## 2015-07-19 DIAGNOSIS — Z86010 Personal history of colonic polyps: Secondary | ICD-10-CM

## 2015-07-19 MED ORDER — NA SULFATE-K SULFATE-MG SULF 17.5-3.13-1.6 GM/177ML PO SOLN: 1.0000 | Freq: Once | ORAL | Status: DC

## 2015-07-19 NOTE — Progress Notes (Signed)
No egg or soy allergy known to patient  No issues with past sedation with any surgeries  or procedures, no intubation problems  No diet pills per patient No home 02 use per patient  No blood thinners per patient  Pt denies issues with constipation  emmi declined

## 2015-08-16 ENCOUNTER — Encounter: Payer: Self-pay | Admitting: Gastroenterology

## 2015-08-16 ENCOUNTER — Ambulatory Visit (AMBULATORY_SURGERY_CENTER): Payer: 59 | Admitting: Gastroenterology

## 2015-08-16 VITALS — BP 126/80 | HR 61 | Temp 99.6°F | Resp 15 | Ht 60.0 in | Wt 174.0 lb

## 2015-08-16 DIAGNOSIS — Z86010 Personal history of colonic polyps: Secondary | ICD-10-CM

## 2015-08-16 DIAGNOSIS — D122 Benign neoplasm of ascending colon: Secondary | ICD-10-CM

## 2015-08-16 DIAGNOSIS — D123 Benign neoplasm of transverse colon: Secondary | ICD-10-CM

## 2015-08-16 MED ORDER — SODIUM CHLORIDE 0.9 % IV SOLN: 500.0000 mL | INTRAVENOUS | Status: DC

## 2015-08-16 NOTE — Patient Instructions (Signed)
Discharge instructions given. Handout on polyps. Resume previous medications. YOU HAD AN ENDOSCOPIC PROCEDURE TODAY AT THE [facility]:   Refer to the procedure report that was given to you for any specific questions about what was found during the examination.  If the procedure report does not answer your questions, please call your gastroenterologist to clarify.  If you requested that your care partner not be given the details of your procedure findings, then the procedure report has been included in a sealed envelope for you to review at your convenience later.  YOU SHOULD EXPECT: Some feelings of bloating in the abdomen. Passage of more gas than usual.  Walking can help get rid of the air that was put into your GI tract during the procedure and reduce the bloating. If you had a lower endoscopy (such as a colonoscopy or flexible sigmoidoscopy) you may notice spotting of blood in your stool or on the toilet paper. If you underwent a bowel prep for your procedure, you may not have a normal bowel movement for a few days.  Please Note:  You might notice some irritation and congestion in your nose or some drainage.  This is from the oxygen used during your procedure.  There is no need for concern and it should clear up in a day or so.  SYMPTOMS TO REPORT IMMEDIATELY:   Following lower endoscopy (colonoscopy or flexible sigmoidoscopy):  Excessive amounts of blood in the stool  Significant tenderness or worsening of abdominal pains  Swelling of the abdomen that is new, acute  Fever of 100F or higher   For urgent or emergent issues, a gastroenterologist can be reached at any hour by calling [phone].   DIET: Your first meal following the procedure should be a small meal and then it is ok to progress to your normal diet. Heavy or fried foods are harder to digest and may make you feel nauseous or bloated.  Likewise, meals heavy in dairy and vegetables can increase bloating.  Drink  plenty of fluids but you should avoid alcoholic beverages for 24 hours.  ACTIVITY:  You should plan to take it easy for the rest of today and you should NOT DRIVE or use heavy machinery until tomorrow (because of the sedation medicines used during the test).    FOLLOW UP: Our staff will call the number listed on your records the next business day following your procedure to check on you and address any questions or concerns that you may have regarding the information given to you following your procedure. If we do not reach you, we will leave a message.  However, if you are feeling well and you are not experiencing any problems, there is no need to return our call.  We will assume that you have returned to your regular daily activities without incident.  If any biopsies were taken you will be contacted by phone or by letter within the next 1-3 weeks.  Please call us at [phone] if you have not heard about the biopsies in 3 weeks.    SIGNATURES/CONFIDENTIALITY: You and/or your care partner have signed paperwork which will be entered into your electronic medical record.  These signatures attest to the fact that that the information above on your After Visit Summary has been reviewed and is understood.  Full responsibility of the confidentiality of this discharge information lies with you and/or your care-partner. 

## 2015-08-16 NOTE — Progress Notes (Signed)
To recovery, report to McCoy, RN, VSS 

## 2015-08-16 NOTE — Progress Notes (Signed)
Called to room to assist during endoscopic procedure.  Patient ID and intended procedure confirmed with present staff. Received instructions for my participation in the procedure from the performing physician.  

## 2015-08-16 NOTE — Op Note (Signed)
[facility] Patient Name: Courtney Guzman Procedure Date: 08/16/2015 11:14 AM MRN: [id number] Endoscopist: Remo Lipps P. Havery Moros , MD Age: 55 Date of Birth: Mar 08, 1961 Gender: Female Procedure:                Colonoscopy Indications:              Surveillance: Personal history of adenomatous                            polyps on last colonoscopy 5 years ago Medicines:                Monitored Anesthesia Care Procedure:                Pre-Anesthesia Assessment:                           - Prior to the procedure, a History and Physical                            was performed, and patient medications and                            allergies were reviewed. The patient's tolerance of                            previous anesthesia was also reviewed. The risks                            and benefits of the procedure and the sedation                            options and risks were discussed with the patient.                            All questions were answered, and informed consent                            was obtained. Prior Anticoagulants: The patient has                            taken no previous anticoagulant or antiplatelet                            agents. ASA Grade Assessment: II - A patient with                            mild systemic disease. After reviewing the risks                            and benefits, the patient was deemed in                            satisfactory condition to undergo the procedure.  After obtaining informed consent, the colonoscope                            was passed under direct vision. Throughout the                            procedure, the patient's blood pressure, pulse, and                            oxygen saturations were monitored continuously. The                            Model PCF-H190DL [id number]) scope was introduced                            through the anus and advanced to the the terminal                        ileum, with identification of the appendiceal                            orifice and IC valve. The colonoscopy was performed                            without difficulty. The patient tolerated the                            procedure well. The quality of the bowel                            preparation was adequate. The terminal ileum,                            ileocecal valve, appendiceal orifice, and rectum                            were photographed. Scope In: 11:18:11 AM Scope Out: 11:34:13 AM Scope Withdrawal Time: 0 hours 13 minutes 4 seconds  Total Procedure Duration: 0 hours 16 minutes 1 second  Findings:                 The perianal and digital rectal examinations were                            normal.                           A 3 mm polyp was found in the ascending colon. The                            polyp was sessile. The polyp was removed with a                            cold biopsy forceps. Resection and retrieval were  complete.                           A 3 mm polyp was found in the transverse colon. The                            polyp was sessile. The polyp was removed with a                            cold biopsy forceps. Resection and retrieval were                            complete.                           The terminal ileum appeared normal.                           The exam was otherwise without abnormality. Complications:            No immediate complications. Estimated blood loss:                            Minimal. Estimated Blood Loss:     Estimated blood loss was minimal. Impression:               - One 3 mm polyp in the ascending colon, removed                            with a cold biopsy forceps. Resected and retrieved.                           - One 3 mm polyp in the transverse colon, removed                            with a cold biopsy forceps. Resected and retrieved.                           -  The examined portion of the ileum was normal.                           - The examination was otherwise normal. Recommendation:           - Patient has a contact number available for                            emergencies. The signs and symptoms of potential                            delayed complications were discussed with the                            patient. Return to normal activities tomorrow.  Written discharge instructions were provided to the                            patient.                           - Resume previous diet.                           - Continue present medications.                           - Await pathology results.                           - Repeat colonoscopy is recommended for                            surveillance. The colonoscopy date will be                            determined after pathology results from today's                            exam become available for review. Remo Lipps P. Lacye Mccarn, MD 08/16/2015 11:37:56 AM This report has been signed electronically.

## 2015-08-19 ENCOUNTER — Telehealth: Payer: Self-pay | Admitting: *Deleted

## 2015-08-19 NOTE — Telephone Encounter (Signed)
  Follow up Call-  Call back number 08/16/2015  Post procedure Call Back phone  # [phone]  Permission to leave phone message Yes     No answer, left message

## 2015-08-26 ENCOUNTER — Encounter: Payer: Self-pay | Admitting: Gastroenterology

## 2015-09-12 ENCOUNTER — Telehealth: Payer: Self-pay | Admitting: Internal Medicine

## 2015-09-12 ENCOUNTER — Other Ambulatory Visit (INDEPENDENT_AMBULATORY_CARE_PROVIDER_SITE_OTHER): Payer: 59

## 2015-09-12 DIAGNOSIS — E785 Hyperlipidemia, unspecified: Secondary | ICD-10-CM

## 2015-09-12 DIAGNOSIS — E119 Type 2 diabetes mellitus without complications: Secondary | ICD-10-CM

## 2015-09-12 LAB — LIPID PANEL
Cholesterol: 188 mg/dL (ref 0–200)
HDL: 47.4 mg/dL (ref >39.00)
LDL Cholesterol: 124 mg/dL — ABNORMAL HIGH (ref 0–99)
NonHDL: 140.71
Total CHOL/HDL Ratio: 4
Triglycerides: 83 mg/dL (ref 0.0–149.0)
VLDL: 16.6 mg/dL (ref 0.0–40.0)

## 2015-09-12 LAB — HEMOGLOBIN A1C: Hgb A1c MFr Bld: 6.5 % (ref 4.6–6.5)

## 2015-09-12 LAB — MICROALBUMIN / CREATININE URINE RATIO
Creatinine,U: 38.2 mg/dL
Microalb Creat Ratio: 1.8 mg/g (ref 0.0–30.0)
Microalb, Ur: <0.7 mg/dL (ref 0.0–1.9)

## 2015-09-12 NOTE — Telephone Encounter (Signed)
Pt came into for labs and wanted to let you know her weight was 165

## 2015-09-12 NOTE — Telephone Encounter (Signed)
noted 

## 2015-09-13 ENCOUNTER — Telehealth: Payer: Self-pay | Admitting: Internal Medicine

## 2015-09-13 NOTE — Telephone Encounter (Signed)
Pt called regarding labs [phone] Thank you

## 2015-10-12 ENCOUNTER — Other Ambulatory Visit: Payer: Self-pay | Admitting: Gynecology

## 2015-10-23 ENCOUNTER — Other Ambulatory Visit: Payer: Self-pay

## 2015-10-23 MED ORDER — OMEPRAZOLE 40 MG PO CPDR: 40.0000 mg | DELAYED_RELEASE_CAPSULE | Freq: Every day | ORAL | Status: DC

## 2015-10-23 NOTE — Telephone Encounter (Signed)
Rx sent electronically.  

## 2016-01-29 ENCOUNTER — Ambulatory Visit (INDEPENDENT_AMBULATORY_CARE_PROVIDER_SITE_OTHER): Payer: 59 | Admitting: Rheumatology

## 2016-01-29 DIAGNOSIS — M79672 Pain in left foot: Secondary | ICD-10-CM | POA: Diagnosis not present

## 2016-01-29 DIAGNOSIS — G4709 Other insomnia: Secondary | ICD-10-CM

## 2016-01-29 DIAGNOSIS — R5381 Other malaise: Secondary | ICD-10-CM

## 2016-01-29 DIAGNOSIS — M797 Fibromyalgia: Secondary | ICD-10-CM

## 2016-03-03 ENCOUNTER — Ambulatory Visit (INDEPENDENT_AMBULATORY_CARE_PROVIDER_SITE_OTHER): Payer: 59 | Admitting: Internal Medicine

## 2016-03-03 ENCOUNTER — Encounter: Payer: Self-pay | Admitting: Internal Medicine

## 2016-03-03 VITALS — BP 120/78 | HR 71 | Temp 98.1°F | Wt 162.0 lb

## 2016-03-03 DIAGNOSIS — M797 Fibromyalgia: Secondary | ICD-10-CM | POA: Diagnosis not present

## 2016-03-03 DIAGNOSIS — K219 Gastro-esophageal reflux disease without esophagitis: Secondary | ICD-10-CM

## 2016-03-03 DIAGNOSIS — E119 Type 2 diabetes mellitus without complications: Secondary | ICD-10-CM | POA: Diagnosis not present

## 2016-03-03 DIAGNOSIS — R030 Elevated blood-pressure reading, without diagnosis of hypertension: Secondary | ICD-10-CM

## 2016-03-03 DIAGNOSIS — E7800 Pure hypercholesterolemia, unspecified: Secondary | ICD-10-CM

## 2016-03-03 DIAGNOSIS — J301 Allergic rhinitis due to pollen: Secondary | ICD-10-CM

## 2016-03-03 LAB — CBC
HCT: 38.5 % (ref 36.0–46.0)
Hemoglobin: 12.7 g/dL (ref 12.0–15.0)
MCHC: 32.9 g/dL (ref 30.0–36.0)
MCV: 85.8 fl (ref 78.0–100.0)
Platelets: 335 10*3/uL (ref 150.0–400.0)
RBC: 4.49 Mil/uL (ref 3.87–5.11)
RDW: 13.9 % (ref 11.5–15.5)
WBC: 6.1 10*3/uL (ref 4.0–10.5)

## 2016-03-03 LAB — COMPREHENSIVE METABOLIC PANEL
ALT: 12 U/L (ref 0–35)
AST: 18 U/L (ref 0–37)
Albumin: 4.2 g/dL (ref 3.5–5.2)
Alkaline Phosphatase: 73 U/L (ref 39–117)
BUN: 13 mg/dL (ref 6–23)
CO2: 29 mEq/L (ref 19–32)
Calcium: 9.8 mg/dL (ref 8.4–10.5)
Chloride: 106 mEq/L (ref 96–112)
Creatinine, Ser: 0.69 mg/dL (ref 0.40–1.20)
GFR: 113.14 mL/min (ref >60.00)
Glucose, Bld: 100 mg/dL — ABNORMAL HIGH (ref 70–99)
Potassium: 3.8 mEq/L (ref 3.5–5.1)
Sodium: 141 mEq/L (ref 135–145)
Total Bilirubin: 0.5 mg/dL (ref 0.2–1.2)
Total Protein: 7.2 g/dL (ref 6.0–8.3)

## 2016-03-03 LAB — HEMOGLOBIN A1C: Hgb A1c MFr Bld: 6.1 % (ref 4.6–6.5)

## 2016-03-03 LAB — LIPID PANEL
Cholesterol: 199 mg/dL (ref 0–200)
HDL: 71.2 mg/dL (ref >39.00)
LDL Cholesterol: 113 mg/dL — ABNORMAL HIGH (ref 0–99)
NonHDL: 127.52
Total CHOL/HDL Ratio: 3
Triglycerides: 71 mg/dL (ref 0.0–149.0)
VLDL: 14.2 mg/dL (ref 0.0–40.0)

## 2016-03-03 LAB — MICROALBUMIN / CREATININE URINE RATIO
Creatinine,U: 9.4 mg/dL
Microalb Creat Ratio: 7.4 mg/g (ref 0.0–30.0)
Microalb, Ur: <0.7 mg/dL (ref 0.0–1.9)

## 2016-03-03 NOTE — Assessment & Plan Note (Signed)
Will check CMET and lipid profile today LDL goal < 100 Encouraged her to consume a low fat diet

## 2016-03-03 NOTE — Progress Notes (Signed)
HPI  Pt presents to the clinic today to follow up annual exams.  Fibromyalgia: She follows with Dr. August Luz. She is not taking the  Flexeril at all. She takes Robaxin during the day, because that doesn't make her sleepy, and she can work. She takes the Gabapentin as prescribed. She takes Ultram, Voltaren gel and Aleve as needed. She reports her pain is well controlled.  HLD: Her last LDL was 124. She is not on any cholesterol lowering medication. She does take Fish Oil daily. She does try to consume a low fat diet.  Seasonal Allergies: Worse in the fall. She takes Allegra daily with good relief.  GERD:Triggered by spicy, fried and fatty foods. She also notices it when she eats late at night. Symptoms well controlled on Prilosec. 03/03/2016  Elevated blood pressure: BP today 120/78. She has never had had high blood pressure. She denies chest pain or shortness of breath.  Dm 2: Her last A1C was 6.5%, down from 6.7%. This is diet controlled, she is not taking any medication at this time. She has lost 22 lbs since her last visit. Her last eye exam was 12/2015. Flu shot 02/19/2016. Pneumovax never.  Past Medical History:  Diagnosis Date  . Allergy    seasonal  . Arthritis   . Fibromyalgia    dr. Marveen Reeks  . GERD (gastroesophageal reflux disease)   . Hyperlipidemia   . Wheezes    with season changes     Current Outpatient Prescriptions  Medication Sig Dispense Refill  . albuterol (VENTOLIN HFA) 108 (90 Base) MCG/ACT inhaler INHALE 2 PUFFS INTO THE LUNGS EVERY 4 (FOUR) HOURS AS NEEDED FOR WHEEZING. 18 each 5  . calcium carbonate (OS-CAL) 600 MG TABS Take 600 mg by mouth 2 (two) times daily with a meal.      . cyclobenzaprine (FLEXERIL) 10 MG tablet Take 10 mg by mouth 3 (three) times daily as needed.      . ferrous sulfate 325 (65 FE) MG tablet Take 325 mg by mouth daily with breakfast.    . fexofenadine (ALLEGRA) 30 MG tablet Take 30 mg by mouth 2 (two) times daily.    . fish  oil-omega-3 fatty acids 1000 MG capsule Take 2 g by mouth daily.    Marland Kitchen gabapentin (NEURONTIN) 300 MG capsule Take 300 mg by mouth 3 (three) times daily.      Nyoka Cowden Tea, Camillia sinensis, (GREEN TEA PO) Take by mouth.      . magic mouthwash w/lidocaine SOLN Take 5 mLs by mouth 4 (four) times daily as needed for mouth pain. 180 mL 0  . methocarbamol (ROBAXIN) 500 MG tablet     . Multiple Vitamin (MULTIVITAMIN) tablet Take 1 tablet by mouth daily.    Marland Kitchen omeprazole (PRILOSEC) 40 MG capsule Take 1 capsule (40 mg total) by mouth daily. 30 capsule 5  . OVER THE COUNTER MEDICATION Cholester Reg II    . traMADol (ULTRAM) 50 MG tablet Take by mouth every 12 (twelve) hours as needed.    . TRAVATAN Z 0.004 % SOLN ophthalmic solution     . VOLTAREN 1 % GEL Reported on 07/19/2015     No current facility-administered medications for this visit.     No Known Allergies  Family History  Problem Relation Age of Onset  . Hypertension Mother   . Diabetes Sister   . Cancer Maternal Grandmother     OVARIAN  . Ovarian cancer Maternal Grandmother   . Colon cancer Neg Hx   .  Colon polyps Neg Hx     Social History   Social History  . Marital status: Married    Spouse name: N/A  . Number of children: N/A  . Years of education: N/A   Occupational History  . Not on file.   Social History Main Topics  . Smoking status: Never Smoker  . Smokeless tobacco: Never Used  . Alcohol use No  . Drug use: No  . Sexual activity: Yes    Partners: Male    Birth control/ protection: Surgical     Comment: TUBAL LIGATION, 1st sexual encounter at 12, has had 5    Other Topics Concern  . Not on file   Social History Narrative  . No narrative on file    ROS:  Constitutional: Denies fever, malaise, fatigue, headache or abrupt weight changes.  Respiratory: Denies difficulty breathing, shortness of breath, cough or sputum production.   Cardiovascular: Denies chest pain, chest tightness, palpitations or  swelling in the hands or feet.  Gastrointestinal: Denies abdominal pain, bloating, constipation, diarrhea or blood in the stool.  Musculoskeletal: Pt reports joint and muscle pain. Denies decrease in range of motion, difficulty with gait, or joint swelling.  Skin: Denies redness, rashes, lesions or ulcercations.  Neurological: Denies dizziness, difficulty with memory, difficulty with speech or problems with balance and coordination.  Psych: Denies anxiety, depression, SI/HI.  No other specific complaints in a complete review of systems (except as listed in HPI above).  PE:  BP 120/78   Pulse 71   Temp 98.1 F (36.7 C) (Oral)   Wt 162 lb (73.5 kg)   LMP 02/24/2008   SpO2 97%   BMI 31.64 kg/m    Wt Readings from Last 3 Encounters:  08/16/15 174 lb (78.9 kg)  07/19/15 174 lb (78.9 kg)  06/14/15 181 lb (82.1 kg)    General: Appears her stated age, obese in NAD.  Cardiovascular: Normal rate and rhythm. S1,S2 noted.  No murmur, rubs or gallops noted. No carotid bruits noted. Pulmonary/Chest: Normal effort and positive vesicular breath sounds. No respiratory distress. No wheezes, rales or ronchi noted.  Abdomen: Soft and nontender. Normal bowel sounds. Musculoskeletal: Strength 5/5 BUE/BLE.  Neurological: Alert and oriented.  Psychiatric: Mood and affect normal. Behavior is normal. Judgment and thought content normal.     BMET    Component Value Date/Time   NA 139 06/04/2015 0849   K 4.0 06/04/2015 0849   CL 105 06/04/2015 0849   CO2 29 06/04/2015 0849   GLUCOSE 119 (H) 06/04/2015 0849   BUN 13 06/04/2015 0849   CREATININE 0.64 06/04/2015 0849   CREATININE 0.66 04/24/2014 0859   CALCIUM 9.6 06/04/2015 0849    Lipid Panel     Component Value Date/Time   CHOL 188 09/12/2015 0752   TRIG 83.0 09/12/2015 0752   HDL 47.40 09/12/2015 0752   CHOLHDL 4 09/12/2015 0752   VLDL 16.6 09/12/2015 0752   LDLCALC 124 (H) 09/12/2015 0752    CBC    Component Value Date/Time    WBC 8.5 06/04/2015 0849   RBC 4.45 06/04/2015 0849   HGB 12.6 06/04/2015 0849   HCT 37.8 06/04/2015 0849   PLT 337.0 06/04/2015 0849   MCV 85.1 06/04/2015 0849   MCH 27.8 04/24/2014 0859   MCHC 33.2 06/04/2015 0849   RDW 14.5 06/04/2015 0849   LYMPHSABS 4.5 (H) 04/24/2014 0859   MONOABS 0.5 04/24/2014 0859   EOSABS 0.1 04/24/2014 0859   BASOSABS 0.0 04/24/2014 0859  Hgb A1C Lab Results  Component Value Date   HGBA1C 6.5 09/12/2015     Assessment and Plan:

## 2016-03-03 NOTE — Assessment & Plan Note (Signed)
Improved with weight loss Will monitor

## 2016-03-03 NOTE — Assessment & Plan Note (Signed)
Encouraged her to consume a low carb, low fat diet and exercise to lose weight A1C and microalbumin today Encouraged yearly eye exams Foot exam today Flu shot UTD She declines pneumonia vaccine

## 2016-03-03 NOTE — Patient Instructions (Signed)

## 2016-03-03 NOTE — Assessment & Plan Note (Signed)
Controlled with Prilosec Avoid triggers and eating late at night CMET today

## 2016-03-03 NOTE — Assessment & Plan Note (Signed)
Discussed avoiding triggers Continue Allegra for now

## 2016-03-03 NOTE — Assessment & Plan Note (Signed)
She will continue to follow with Dr. Estanislado Pandy Continue Robaxin, Ultram, Gabapentin, Voltaren gel and Aleve as needed Encouraged her to continue diet and exercise to lose weight and tone to help with pain relief.

## 2016-03-08 ENCOUNTER — Encounter (HOSPITAL_BASED_OUTPATIENT_CLINIC_OR_DEPARTMENT_OTHER): Payer: Self-pay | Admitting: *Deleted

## 2016-03-08 ENCOUNTER — Emergency Department (HOSPITAL_BASED_OUTPATIENT_CLINIC_OR_DEPARTMENT_OTHER)
Admission: EM | Admit: 2016-03-08 | Discharge: 2016-03-08 | Disposition: A | Discharge status: Home / Self Care | Payer: 59 | Attending: Emergency Medicine | Admitting: Emergency Medicine

## 2016-03-08 DIAGNOSIS — W25XXXA Contact with sharp glass, initial encounter: Secondary | ICD-10-CM | POA: Diagnosis not present

## 2016-03-08 DIAGNOSIS — Y929 Unspecified place or not applicable: Secondary | ICD-10-CM | POA: Diagnosis not present

## 2016-03-08 DIAGNOSIS — Y999 Unspecified external cause status: Secondary | ICD-10-CM | POA: Insufficient documentation

## 2016-03-08 DIAGNOSIS — Y9389 Activity, other specified: Secondary | ICD-10-CM | POA: Diagnosis not present

## 2016-03-08 DIAGNOSIS — E119 Type 2 diabetes mellitus without complications: Secondary | ICD-10-CM | POA: Diagnosis not present

## 2016-03-08 DIAGNOSIS — Z23 Encounter for immunization: Secondary | ICD-10-CM | POA: Insufficient documentation

## 2016-03-08 DIAGNOSIS — S61011A Laceration without foreign body of right thumb without damage to nail, initial encounter: Secondary | ICD-10-CM | POA: Diagnosis present

## 2016-03-08 DIAGNOSIS — Z79899 Other long term (current) drug therapy: Secondary | ICD-10-CM | POA: Insufficient documentation

## 2016-03-08 HISTORY — DX: Type 2 diabetes mellitus without complications: E11.9

## 2016-03-08 MED ORDER — TETANUS-DIPHTH-ACELL PERTUSSIS 5-2.5-18.5 LF-MCG/0.5 IM SUSP
0.5000 mL | Freq: Once | INTRAMUSCULAR | Status: AC
  Administered 2016-03-08: 0.5 mL via INTRAMUSCULAR
  Filled 2016-03-08: qty 0.5

## 2016-03-08 NOTE — ED Triage Notes (Signed)
Laceration to right thumb while cleaning a glass vase, bleeding controlled

## 2016-03-08 NOTE — ED Provider Notes (Signed)
[facility] Provider Note   CSN: [id number] Arrival date & time: 03/08/16  1032     History   Chief Complaint Chief Complaint  Patient presents with  . Laceration    HPI Courtney Guzman is a 55 y.o. female.  Patient had broken a vase and when she went to pick up the vase the glass cut her right thumb. No retained glass and she did not pull any glass out of the wound. No weakness, numbness or tingling. Patient is right-handed.   The history is provided by the patient.  Laceration   The incident occurred 1 to 2 hours ago. The laceration is located on the right hand. The laceration is 2 cm in size. The laceration mechanism was a broken glass. The pain is at a severity of 1/10. The pain is mild. The pain has been constant since onset. She reports no foreign bodies present. Her tetanus status is unknown.    Past Medical History:  Diagnosis Date  . Allergy    seasonal  . Arthritis   . Diabetes mellitus without complication ([place])   . Fibromyalgia    dr. Marveen Reeks  . GERD (gastroesophageal reflux disease)   . Hyperlipidemia   . Wheezes    with season changes     Patient Active Problem List   Diagnosis Date Noted  . Seasonal allergic rhinitis due to pollen 03/03/2016  . Type 2 diabetes mellitus without complication, without long-term current use of insulin ([place]) 06/14/2015  . Elevated blood pressure reading without diagnosis of hypertension 03/01/2015  . Hyperlipidemia 05/10/2014  . Family history of ovarian cancer 04/08/2012  . Osteopenia 03/22/2012  . GERD (gastroesophageal reflux disease)   . Fibromyalgia 07/26/2008    Past Surgical History:  Procedure Laterality Date  . [place]   with BTL  . COLONOSCOPY    . COLONOSCOPY W/ POLYPECTOMY  2006   BENIGN ADENOMA   . POLYPECTOMY    . TONSILLECTOMY AND ADENOIDECTOMY      OB History    Gravida Para Term Preterm AB Living   2 2 2     2    SAB TAB Ectopic Multiple Live Births           2       Home Medications    Prior to Admission medications   Medication Sig Start Date End Date Taking? Authorizing Provider  albuterol (VENTOLIN HFA) 108 (90 Base) MCG/ACT inhaler INHALE 2 PUFFS INTO THE LUNGS EVERY 4 (FOUR) HOURS AS NEEDED FOR WHEEZING. 06/04/15   Jearld Fenton, NP  calcium carbonate (OS-CAL) 600 MG TABS Take 600 mg by mouth 2 (two) times daily with a meal.      Historical Provider, MD  cyclobenzaprine (FLEXERIL) 10 MG tablet Take 10 mg by mouth 3 (three) times daily as needed.      Historical Provider, MD  ferrous sulfate 325 (65 FE) MG tablet Take 325 mg by mouth daily with breakfast.    Historical Provider, MD  fexofenadine (ALLEGRA) 30 MG tablet Take 30 mg by mouth 2 (two) times daily.    Historical Provider, MD  fish oil-omega-3 fatty acids 1000 MG capsule Take 2 g by mouth daily.    Historical Provider, MD  gabapentin (NEURONTIN) 300 MG capsule Take 300 mg by mouth 3 (three) times daily.      Historical Provider, MD  Nyoka Cowden Tea, Camillia sinensis, (GREEN TEA PO) Take by mouth.      Historical Provider, MD  Multiple  Vitamin (MULTIVITAMIN) tablet Take 1 tablet by mouth daily.    Historical Provider, MD  omeprazole (PRILOSEC) 40 MG capsule Take 1 capsule (40 mg total) by mouth daily. 10/23/15   Venia Carbon, MD  OVER THE COUNTER MEDICATION Cholester Reg II    Historical Provider, MD  traMADol (ULTRAM) 50 MG tablet Take by mouth every 12 (twelve) hours as needed.    Historical Provider, MD  TRAVATAN Z 0.004 % SOLN ophthalmic solution  07/29/14   Historical Provider, MD  VOLTAREN 1 % GEL Reported on 07/19/2015 08/01/14   Historical Provider, MD    Family History Family History  Problem Relation Age of Onset  . Hypertension Mother   . Diabetes Sister   . Cancer Maternal Grandmother     OVARIAN  . Ovarian cancer Maternal Grandmother   . Colon cancer Neg Hx   . Colon polyps Neg Hx     Social History Social History  Substance Use Topics  . Smoking status: Never Smoker   . Smokeless tobacco: Never Used  . Alcohol use No     Allergies   Patient has no known allergies.   Review of Systems Review of Systems  All other systems reviewed and are negative.    Physical Exam Updated Vital Signs BP 125/90 (BP Location: Left Arm)   Pulse 82   Temp 97.8 F (36.6 C) (Oral)   Resp 18   Ht 5' (1.524 m)   Wt 162 lb (73.5 kg)   LMP 02/24/2008   SpO2 100%   BMI 31.64 kg/m   Physical Exam  Constitutional: She is oriented to person, place, and time. She appears well-developed and well-nourished. No distress.  HENT:  Head: Normocephalic and atraumatic.  Eyes: EOM are normal. Pupils are equal, round, and reactive to light.  Cardiovascular: Normal rate.   Pulmonary/Chest: Effort normal.  Musculoskeletal: She exhibits no deformity.       Hands: Neurological: She is alert and oriented to person, place, and time.  Nursing note and vitals reviewed.    ED Treatments / Results  Labs (all labs ordered are listed, but only abnormal results are displayed) Labs Reviewed - No data to display  EKG  EKG Interpretation None       Radiology No results found.  Procedures Procedures (including critical care time)  Medications Ordered in ED Medications  Tdap (BOOSTRIX) injection 0.5 mL (0.5 mLs Intramuscular Given 03/08/16 1114)     Initial Impression / Assessment and Plan / ED Course  I have reviewed the triage vital signs and the nursing notes.  Pertinent labs & imaging results that were available during my care of the patient were reviewed by me and considered in my medical decision making (see chart for details).  Clinical Course     LACERATION REPAIR Performed by: Blanchie Dessert Authorized byBlanchie Dessert Consent: Verbal consent obtained. Risks and benefits: risks, benefits and alternatives were discussed Consent given by: patient Patient identity confirmed: provided demographic data Prepped and Draped in normal sterile  fashion Wound explored  Laceration Location: right thumb  Laceration Length: 2cm  No Foreign Bodies seen or palpated  Anesthesia: none Irrigation method: scrub Amount of cleaning: standard  Skin closure: dermabond   Patient tolerance: Patient tolerated the procedure well with no immediate complications.   Final Clinical Impressions(s) / ED Diagnoses   Final diagnoses:  Laceration of right thumb without foreign body without damage to nail, initial encounter    New Prescriptions New Prescriptions   No medications on  file     Blanchie Dessert, MD 03/08/16 [phone]

## 2016-03-08 NOTE — ED Notes (Signed)
Called at waiting area no answer

## 2016-03-08 NOTE — ED Notes (Signed)
Lac from glass on right thumb. Bleeding controlled.

## 2016-03-09 ENCOUNTER — Emergency Department (HOSPITAL_BASED_OUTPATIENT_CLINIC_OR_DEPARTMENT_OTHER)
Admission: EM | Admit: 2016-03-09 | Discharge: 2016-03-09 | Disposition: A | Discharge status: Home / Self Care | Payer: 59 | Attending: Emergency Medicine | Admitting: Emergency Medicine

## 2016-03-09 ENCOUNTER — Encounter (HOSPITAL_BASED_OUTPATIENT_CLINIC_OR_DEPARTMENT_OTHER): Payer: Self-pay | Admitting: *Deleted

## 2016-03-09 DIAGNOSIS — E119 Type 2 diabetes mellitus without complications: Secondary | ICD-10-CM | POA: Insufficient documentation

## 2016-03-09 DIAGNOSIS — Y69 Unspecified misadventure during surgical and medical care: Secondary | ICD-10-CM | POA: Insufficient documentation

## 2016-03-09 DIAGNOSIS — T8131XA Disruption of external operation (surgical) wound, not elsewhere classified, initial encounter: Secondary | ICD-10-CM | POA: Diagnosis not present

## 2016-03-09 DIAGNOSIS — Z4801 Encounter for change or removal of surgical wound dressing: Secondary | ICD-10-CM | POA: Diagnosis present

## 2016-03-09 DIAGNOSIS — T8130XA Disruption of wound, unspecified, initial encounter: Secondary | ICD-10-CM

## 2016-03-09 NOTE — ED Provider Notes (Signed)
[facility] DEPT [facility] Provider Note   CSN: [id number] Arrival date & time: 03/09/16  1353     History   Chief Complaint Chief Complaint  Patient presents with  . Wound Check    HPI Courtney Guzman is a 55 y.o. female.  HPI   Patient presents with dehiscence of hand laceration that occurred yesterday around 10am.  States she was picking up a broken vase and accidentally cut the back of her right hand.  She is right hand dominant.   No increase in pain.  No new injury.    Past Medical History:  Diagnosis Date  . Allergy    seasonal  . Arthritis   . Diabetes mellitus without complication ([place])   . Fibromyalgia    dr. Marveen Reeks  . GERD (gastroesophageal reflux disease)   . Hyperlipidemia   . Wheezes    with season changes     Patient Active Problem List   Diagnosis Date Noted  . Seasonal allergic rhinitis due to pollen 03/03/2016  . Type 2 diabetes mellitus without complication, without long-term current use of insulin ([place]) 06/14/2015  . Elevated blood pressure reading without diagnosis of hypertension 03/01/2015  . Hyperlipidemia 05/10/2014  . Family history of ovarian cancer 04/08/2012  . Osteopenia 03/22/2012  . GERD (gastroesophageal reflux disease)   . Fibromyalgia 07/26/2008    Past Surgical History:  Procedure Laterality Date  . [place]   with BTL  . COLONOSCOPY    . COLONOSCOPY W/ POLYPECTOMY  2006   BENIGN ADENOMA   . POLYPECTOMY    . TONSILLECTOMY AND ADENOIDECTOMY      OB History    Gravida Para Term Preterm AB Living   2 2 2     2    SAB TAB Ectopic Multiple Live Births           2       Home Medications    Prior to Admission medications   Medication Sig Start Date End Date Taking? Authorizing Provider  albuterol (VENTOLIN HFA) 108 (90 Base) MCG/ACT inhaler INHALE 2 PUFFS INTO THE LUNGS EVERY 4 (FOUR) HOURS AS NEEDED FOR WHEEZING. 06/04/15   Jearld Fenton, NP  calcium carbonate (OS-CAL) 600 MG TABS Take 600 mg by mouth 2  (two) times daily with a meal.      Historical Provider, MD  cyclobenzaprine (FLEXERIL) 10 MG tablet Take 10 mg by mouth 3 (three) times daily as needed.      Historical Provider, MD  ferrous sulfate 325 (65 FE) MG tablet Take 325 mg by mouth daily with breakfast.    Historical Provider, MD  fexofenadine (ALLEGRA) 30 MG tablet Take 30 mg by mouth 2 (two) times daily.    Historical Provider, MD  fish oil-omega-3 fatty acids 1000 MG capsule Take 2 g by mouth daily.    Historical Provider, MD  gabapentin (NEURONTIN) 300 MG capsule Take 300 mg by mouth 3 (three) times daily.      Historical Provider, MD  Nyoka Cowden Tea, Camillia sinensis, (GREEN TEA PO) Take by mouth.      Historical Provider, MD  Multiple Vitamin (MULTIVITAMIN) tablet Take 1 tablet by mouth daily.    Historical Provider, MD  omeprazole (PRILOSEC) 40 MG capsule Take 1 capsule (40 mg total) by mouth daily. 10/23/15   Venia Carbon, MD  OVER THE COUNTER MEDICATION Cholester Reg II    Historical Provider, MD  traMADol (ULTRAM) 50 MG tablet Take by mouth every 12 (twelve) hours  as needed.    Historical Provider, MD  TRAVATAN Z 0.004 % SOLN ophthalmic solution  07/29/14   Historical Provider, MD  VOLTAREN 1 % GEL Reported on 07/19/2015 08/01/14   Historical Provider, MD    Family History Family History  Problem Relation Age of Onset  . Hypertension Mother   . Diabetes Sister   . Cancer Maternal Grandmother     OVARIAN  . Ovarian cancer Maternal Grandmother   . Colon cancer Neg Hx   . Colon polyps Neg Hx     Social History Social History  Substance Use Topics  . Smoking status: Never Smoker  . Smokeless tobacco: Never Used  . Alcohol use No     Allergies   Patient has no known allergies.   Review of Systems Review of Systems  Constitutional: Negative for chills and fever.  Musculoskeletal: Negative for arthralgias.  Skin: Positive for wound.  Allergic/Immunologic: Negative for immunocompromised state.  Neurological:  Negative for weakness and numbness.  Hematological: Does not bruise/bleed easily.  Psychiatric/Behavioral: Negative for self-injury.     Physical Exam Updated Vital Signs BP 148/99   Pulse 71   Temp 98.4 F (36.9 C) (Oral)   Resp 16   Ht 5' (1.524 m)   Wt 73.5 kg   LMP 02/24/2008   SpO2 99%   BMI 31.64 kg/m   Physical Exam  Constitutional: She appears well-developed and well-nourished. No distress.  HENT:  Head: Normocephalic and atraumatic.  Neck: Neck supple.  Pulmonary/Chest: Effort normal.  Musculoskeletal:  Right dorsal hand with laceration at the base of the thumb.  Wound is open.  No erythema, edema, warmth, discharge, or tenderness.  Full active range of motion of all digits, strength 5/5, sensation intact, capillary refill < 2 seconds.    Neurological: She is alert.  Skin: She is not diaphoretic.  Nursing note and vitals reviewed.    ED Treatments / Results  Labs (all labs ordered are listed, but only abnormal results are displayed) Labs Reviewed - No data to display  EKG  EKG Interpretation None       Radiology No results found.  Procedures Procedures (including critical care time)  Medications Ordered in ED Medications - No data to display   Initial Impression / Assessment and Plan / ED Course  I have reviewed the triage vital signs and the nursing notes.  Pertinent labs & imaging results that were available during my care of the patient were reviewed by me and considered in my medical decision making (see chart for details).  Clinical Course     Afebrile, nontoxic patient with wound dehiscence of small laceration on the dorsal hand that was closed with dermabond yesterday.  Given open wound and time since injury, will not close.  Wound cleansed by nursing with steri strip used to approximate and keep from bleeding, wound dressed and finger splint placed.   D/C home with wound care instructions, return precautions.  Discussed result,  findings, treatment, and follow up  with patient.  Pt given return precautions.  Pt verbalizes understanding and agrees with plan.       Final Clinical Impressions(s) / ED Diagnoses   Final diagnoses:  Wound dehiscence    New Prescriptions Discharge Medication List as of 03/09/2016  2:50 PM       Clayton Bibles, PA-C 03/09/16 1652    Drenda Freeze, MD 03/10/16 [phone]

## 2016-03-09 NOTE — ED Triage Notes (Signed)
States she was here yesterday AM with a laceration to her right thumb. It was derma bonded. Site has opened.

## 2016-03-09 NOTE — Discharge Instructions (Signed)
Read the information below.  You may return to the Emergency Department at any time for worsening condition or any new symptoms that concern you.  Keep the wound clean and covered with a thin layer of antibiotic ointment.   If you develop redness, swelling, pus draining from the wound, or fevers greater than 100.4, return to the ER immediately for a recheck.

## 2016-03-26 ENCOUNTER — Telehealth: Payer: Self-pay | Admitting: Rheumatology

## 2016-03-26 ENCOUNTER — Other Ambulatory Visit: Payer: Self-pay | Admitting: Rheumatology

## 2016-03-26 NOTE — Telephone Encounter (Signed)
-----   Message from Carole Binning, LPN sent at [phone]  8:41 AM EST ----- Regarding: Please schedule patient for follow up appointment  Please schedule patient for follow up appointment. Due in April 2018.

## 2016-03-26 NOTE — Telephone Encounter (Signed)
LMOM for patient to call back to schedule appointment.  

## 2016-03-26 NOTE — Telephone Encounter (Signed)
Last Visit: 01/29/16 Next Visit due April 2018. Message sent to front desk to schedule patient. Labs: 01/31/16 WNL  Okay to refill Gabapentin?

## 2016-03-31 ENCOUNTER — Other Ambulatory Visit: Payer: Self-pay | Admitting: Internal Medicine

## 2016-03-31 DIAGNOSIS — Z1231 Encounter for screening mammogram for malignant neoplasm of breast: Secondary | ICD-10-CM

## 2016-04-23 ENCOUNTER — Other Ambulatory Visit: Payer: Self-pay | Admitting: Rheumatology

## 2016-04-24 NOTE — Telephone Encounter (Signed)
Last Visit: 01/29/16 Next Visit: 07/02/16 Labs: 01/29/16 WNL  Okay to refill Robaxin?

## 2016-05-06 ENCOUNTER — Ambulatory Visit: Payer: 59

## 2016-05-19 ENCOUNTER — Other Ambulatory Visit: Payer: Self-pay | Admitting: Internal Medicine

## 2016-05-19 DIAGNOSIS — H401131 Primary open-angle glaucoma, bilateral, mild stage: Secondary | ICD-10-CM | POA: Diagnosis not present

## 2016-05-28 ENCOUNTER — Ambulatory Visit
Admission: RE | Admit: 2016-05-28 | Discharge: 2016-05-28 | Disposition: A | Discharge status: Home / Self Care | Payer: 59 | Source: Ambulatory Visit | Attending: Internal Medicine | Admitting: Internal Medicine

## 2016-05-28 DIAGNOSIS — Z1231 Encounter for screening mammogram for malignant neoplasm of breast: Secondary | ICD-10-CM

## 2016-07-01 DIAGNOSIS — H40059 Ocular hypertension, unspecified eye: Secondary | ICD-10-CM | POA: Insufficient documentation

## 2016-07-01 DIAGNOSIS — R5383 Other fatigue: Secondary | ICD-10-CM | POA: Insufficient documentation

## 2016-07-01 DIAGNOSIS — M79672 Pain in left foot: Secondary | ICD-10-CM | POA: Insufficient documentation

## 2016-07-01 DIAGNOSIS — F5101 Primary insomnia: Secondary | ICD-10-CM | POA: Insufficient documentation
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
## 2016-07-02 ENCOUNTER — Encounter: Payer: Self-pay | Admitting: Rheumatology

## 2016-07-02 ENCOUNTER — Ambulatory Visit (INDEPENDENT_AMBULATORY_CARE_PROVIDER_SITE_OTHER): Payer: 59 | Admitting: Rheumatology

## 2016-07-02 VITALS — BP 164/98 | HR 69 | Resp 12 | Ht 60.0 in | Wt 167.0 lb

## 2016-07-02 DIAGNOSIS — F5101 Primary insomnia: Secondary | ICD-10-CM | POA: Diagnosis not present

## 2016-07-02 DIAGNOSIS — M62838 Other muscle spasm: Secondary | ICD-10-CM

## 2016-07-02 DIAGNOSIS — M797 Fibromyalgia: Secondary | ICD-10-CM | POA: Diagnosis not present

## 2016-07-02 DIAGNOSIS — R5383 Other fatigue: Secondary | ICD-10-CM | POA: Diagnosis not present

## 2016-07-02 DIAGNOSIS — M79672 Pain in left foot: Secondary | ICD-10-CM | POA: Diagnosis not present

## 2016-07-02 MED ORDER — TIZANIDINE HCL 4 MG PO TABS: 4.0000 mg | ORAL_TABLET | Freq: Every day | ORAL | 2 refills | Status: DC

## 2016-07-03 LAB — CBC WITH DIFFERENTIAL/PLATELET
Basophils Absolute: 0 10*3/uL (ref 0.0–0.2)
Basos: 1 %
EOS (ABSOLUTE): 0.1 10*3/uL (ref 0.0–0.4)
Eos: 2 %
Hematocrit: 37.9 % (ref 34.0–46.6)
Hemoglobin: 12.3 g/dL (ref 11.1–15.9)
Immature Grans (Abs): 0 10*3/uL (ref 0.0–0.1)
Immature Granulocytes: 0 %
Lymphocytes Absolute: 3.5 10*3/uL — ABNORMAL HIGH (ref 0.7–3.1)
Lymphs: 59 %
MCH: 28.3 pg (ref 26.6–33.0)
MCHC: 32.5 g/dL (ref 31.5–35.7)
MCV: 87 fL (ref 79–97)
Monocytes Absolute: 0.3 10*3/uL (ref 0.1–0.9)
Monocytes: 5 %
Neutrophils Absolute: 1.9 10*3/uL (ref 1.4–7.0)
Neutrophils: 33 %
Platelets: 332 10*3/uL (ref 150–379)
RBC: 4.34 x10E6/uL (ref 3.77–5.28)
RDW: 14.1 % (ref 12.3–15.4)
WBC: 5.8 10*3/uL (ref 3.4–10.8)

## 2016-07-03 LAB — CMP14+EGFR
ALT: 16 IU/L (ref 0–32)
AST: 25 IU/L (ref 0–40)
Albumin/Globulin Ratio: 1.9 (ref 1.2–2.2)
Albumin: 4.5 g/dL (ref 3.5–5.5)
Alkaline Phosphatase: 86 IU/L (ref 39–117)
BUN/Creatinine Ratio: 19 (ref 9–23)
BUN: 12 mg/dL (ref 6–24)
Bilirubin Total: <0.2 mg/dL (ref 0.0–1.2)
CO2: 24 mmol/L (ref 18–29)
Calcium: 9.8 mg/dL (ref 8.7–10.2)
Chloride: 106 mmol/L (ref 96–106)
Creatinine, Ser: 0.64 mg/dL (ref 0.57–1.00)
GFR calc Af Amer: 115 mL/min/{1.73_m2} (ref >59)
GFR calc non Af Amer: 100 mL/min/{1.73_m2} (ref >59)
Globulin, Total: 2.4 g/dL (ref 1.5–4.5)
Glucose: 99 mg/dL (ref 65–99)
Potassium: 4.4 mmol/L (ref 3.5–5.2)
Sodium: 144 mmol/L (ref 134–144)
Total Protein: 6.9 g/dL (ref 6.0–8.5)

## 2016-08-11 ENCOUNTER — Encounter: Payer: Self-pay | Admitting: Internal Medicine

## 2016-08-11 ENCOUNTER — Ambulatory Visit (INDEPENDENT_AMBULATORY_CARE_PROVIDER_SITE_OTHER): Payer: 59 | Admitting: Internal Medicine

## 2016-08-11 VITALS — BP 134/94 | HR 70 | Temp 98.2°F | Ht 61.5 in | Wt 164.0 lb

## 2016-08-11 DIAGNOSIS — Z1159 Encounter for screening for other viral diseases: Secondary | ICD-10-CM

## 2016-08-11 DIAGNOSIS — Z0000 Encounter for general adult medical examination without abnormal findings: Secondary | ICD-10-CM | POA: Diagnosis not present

## 2016-08-11 DIAGNOSIS — I10 Essential (primary) hypertension: Secondary | ICD-10-CM | POA: Diagnosis not present

## 2016-08-11 MED ORDER — AMLODIPINE BESYLATE 10 MG PO TABS: 10.0000 mg | ORAL_TABLET | Freq: Every day | ORAL | 0 refills | Status: DC

## 2016-08-11 NOTE — Progress Notes (Signed)
Subjective:    Patient ID: Courtney Guzman, female    DOB: January 17, 1961, 56 y.o.   MRN: [id number]  HPI  Pt presents to the clinic today for her annual exam.  Flu: 02/2016 Tetanus: 02/2016 Pneumovax: Declines Pap Smear: 04/2014 Mammogram: 05/2016 Colon Screening: 07/2015 Vision Screening: yearly Dentist: yearly  Diet: She does eat mostly lean meat. She does eat a lot of veggies, some fruits. She does eat some fried foods. She drinks mostly water and green tea. Exercise: She does Zumba and the [employer], 3 days a week for 1 hour.  Of note, her BP today is 134/94. Her previous 2 blood pressures were 164/98, 148/99. She has never been diagnosed with HTN but reports she does have a family history of this.   Review of Systems      Past Medical History:  Diagnosis Date  . Allergy    seasonal  . Arthritis   . Diabetes mellitus without complication ([place])   . Fibromyalgia    dr. Marveen Reeks  . GERD (gastroesophageal reflux disease)   . Hyperlipidemia   . Wheezes    with season changes     Current Outpatient Prescriptions  Medication Sig Dispense Refill  . albuterol (VENTOLIN HFA) 108 (90 Base) MCG/ACT inhaler INHALE 2 PUFFS INTO THE LUNGS EVERY 4 (FOUR) HOURS AS NEEDED FOR WHEEZING. 18 each 5  . calcium carbonate (OS-CAL) 600 MG TABS Take 600 mg by mouth 2 (two) times daily with a meal.      . ferrous sulfate 325 (65 FE) MG tablet Take 325 mg by mouth daily with breakfast.    . fexofenadine (ALLEGRA) 30 MG tablet Take 30 mg by mouth 2 (two) times daily.    . fish oil-omega-3 fatty acids 1000 MG capsule Take 2 g by mouth daily.    Marland Kitchen gabapentin (NEURONTIN) 100 MG capsule TAKE 2 CAPSULES BY MOUTH 3 TIMES DAILY 540 capsule 0  . gabapentin (NEURONTIN) 300 MG capsule Take 300 mg by mouth 3 (three) times daily.      Nyoka Cowden Tea, Camillia sinensis, (GREEN TEA PO) Take by mouth.      . methocarbamol (ROBAXIN) 500 MG tablet TAKE 1 TABLET BY MOUTH AT 7 A.M. AND 1 TABLET AT 2 P.M. AS NEEDED 60 tablet  4  . Multiple Vitamin (MULTIVITAMIN) tablet Take 1 tablet by mouth daily.    Marland Kitchen omeprazole (PRILOSEC) 40 MG capsule TAKE 1 CAPSULE (40 MG TOTAL) BY MOUTH DAILY. 30 capsule 2  . OVER THE COUNTER MEDICATION Cholester Reg II    . tiZANidine (ZANAFLEX) 4 MG tablet Take 1 tablet (4 mg total) by mouth at bedtime. 30 tablet 2  . traMADol (ULTRAM) 50 MG tablet Take by mouth every 12 (twelve) hours as needed.    . TRAVATAN Z 0.004 % SOLN ophthalmic solution     . VOLTAREN 1 % GEL Reported on 07/19/2015     No current facility-administered medications for this visit.     No Known Allergies  Family History  Problem Relation Age of Onset  . Hypertension Mother   . Diabetes Sister   . Cancer Maternal Grandmother     OVARIAN  . Ovarian cancer Maternal Grandmother   . Colon cancer Neg Hx   . Colon polyps Neg Hx     Social History   Social History  . Marital status: Married    Spouse name: N/A  . Number of children: N/A  . Years of education: N/A   Occupational History  .  Not on file.   Social History Main Topics  . Smoking status: Never Smoker  . Smokeless tobacco: Never Used  . Alcohol use No  . Drug use: No  . Sexual activity: Yes    Partners: Male    Birth control/ protection: Surgical     Comment: TUBAL LIGATION, 1st sexual encounter at 26, has had 5    Other Topics Concern  . Not on file   Social History Narrative  . No narrative on file     Constitutional: Pt reports intermittent headaches. Denies fever, malaise, fatigue, or abrupt weight changes.  HEENT: Denies eye pain, eye redness, ear pain, ringing in the ears, wax buildup, runny nose, nasal congestion, bloody nose, or sore throat. Respiratory: Denies difficulty breathing, shortness of breath, cough or sputum production.   Cardiovascular: Denies chest pain, chest tightness, palpitations or swelling in the hands or feet.  Gastrointestinal: Denies abdominal pain, bloating, constipation, diarrhea or blood in the stool.   GU: Denies urgency, frequency, pain with urination, burning sensation, blood in urine, odor or discharge. Musculoskeletal: Denies decrease in range of motion, difficulty with gait, muscle pain or joint pain and swelling.  Skin: Denies redness, rashes, lesions or ulcercations.  Neurological: Denies dizziness, difficulty with memory, difficulty with speech or problems with balance and coordination.  Psych: Denies anxiety, depression, SI/HI.  No other specific complaints in a complete review of systems (except as listed in HPI above).  Objective:   Physical Exam   BP (!) 134/94   Pulse 70   Temp 98.2 F (36.8 C) (Oral)   Ht 5' 1.5" (1.562 m)   Wt 164 lb (74.4 kg)   LMP 02/24/2008   SpO2 98%   BMI 30.49 kg/m  Wt Readings from Last 3 Encounters:  08/11/16 164 lb (74.4 kg)  07/02/16 167 lb (75.8 kg)  03/09/16 162 lb (73.5 kg)    General: Appears her stated age, obese in NAD. Skin: Warm, dry and intact.  HEENT: Head: normal shape and size; Eyes: sclera white, no icterus, conjunctiva pink, PERRLA and EOMs intact; Ears: Tm's gray and intact, normal light reflex; Throat/Mouth: Teeth present, mucosa pink and moist, no exudate, lesions or ulcerations noted.  Neck:  Neck supple, trachea midline. No masses, lumps or thyromegaly present.  Cardiovascular: Normal rate and rhythm. S1,S2 noted.  No murmur, rubs or gallops noted. No JVD or BLE edema. No carotid bruits noted. Pulmonary/Chest: Normal effort and positive vesicular breath sounds. No respiratory distress. No wheezes, rales or ronchi noted.  Abdomen: Soft and nontender. Normal bowel sounds. No distention or masses noted. Liver, spleen and kidneys non palpable. Musculoskeletal: Strength 5/5 BUE/BLE. No difficulty with gait.  Neurological: Alert and oriented. Cranial nerves II-XII grossly intact. Coordination normal.  Psychiatric: Mood and affect normal. Behavior is normal. Judgment and thought content normal.     BMET    Component  Value Date/Time   NA 144 07/02/2016 0943   K 4.4 07/02/2016 0943   CL 106 07/02/2016 0943   CO2 24 07/02/2016 0943   GLUCOSE 99 07/02/2016 0943   GLUCOSE 100 (H) 03/03/2016 0912   BUN 12 07/02/2016 0943   CREATININE 0.64 07/02/2016 0943   CREATININE 0.66 04/24/2014 0859   CALCIUM 9.8 07/02/2016 0943   GFRNONAA 100 07/02/2016 0943   GFRAA 115 07/02/2016 0943    Lipid Panel     Component Value Date/Time   CHOL 199 03/03/2016 0912   TRIG 71.0 03/03/2016 0912   HDL 71.20 03/03/2016 0912  CHOLHDL 3 03/03/2016 0912   VLDL 14.2 03/03/2016 0912   LDLCALC 113 (H) 03/03/2016 0912    CBC    Component Value Date/Time   WBC 5.8 07/02/2016 0943   WBC 6.1 03/03/2016 0912   RBC 4.34 07/02/2016 0943   RBC 4.49 03/03/2016 0912   HGB 12.7 03/03/2016 0912   HCT 37.9 07/02/2016 0943   PLT 332 07/02/2016 0943   MCV 87 07/02/2016 0943   MCH 28.3 07/02/2016 0943   MCH 27.8 04/24/2014 0859   MCHC 32.5 07/02/2016 0943   MCHC 32.9 03/03/2016 0912   RDW 14.1 07/02/2016 0943   LYMPHSABS 3.5 (H) 07/02/2016 0943   MONOABS 0.5 04/24/2014 0859   EOSABS 0.1 07/02/2016 0943   BASOSABS 0.0 07/02/2016 0943    Hgb A1C Lab Results  Component Value Date   HGBA1C 6.1 03/03/2016           Assessment & Plan:   Preventative Health Maintenance:  Flu and tetanus UTD She declines pneumonia vaccine Pap smear and mammogram UTD Colon screening UTD Encouraged her to consume a balanced diet and exercise regimen Advised her to see an eye doctor and dentist annually Will check CBC, CMET, Lipid, A1C, Vit D and Hep C today  HTN:  Will start Norvasc 10 mg daily  RTC in 3 weeks for follow up HTN Jestin Burbach, NP

## 2016-08-11 NOTE — Patient Instructions (Signed)
Health Maintenance for Postmenopausal Women Menopause is a normal process in which your reproductive ability comes to an end. This process happens gradually over a span of months to years, usually between the ages of 55 and 20. Menopause is complete when you have missed 12 consecutive menstrual periods. It is important to talk with your health care provider about some of the most common conditions that affect postmenopausal women, such as heart disease, cancer, and bone loss (osteoporosis). Adopting a healthy lifestyle and getting preventive care can help to promote your health and wellness. Those actions can also lower your chances of developing some of these common conditions. What should I know about menopause? During menopause, you may experience a number of symptoms, such as:  Moderate-to-severe hot flashes.  Night sweats.  Decrease in sex drive.  Mood swings.  Headaches.  Tiredness.  Irritability.  Memory problems.  Insomnia. Choosing to treat or not to treat menopausal changes is an individual decision that you make with your health care provider. What should I know about hormone replacement therapy and supplements? Hormone therapy products are effective for treating symptoms that are associated with menopause, such as hot flashes and night sweats. Hormone replacement carries certain risks, especially as you become older. If you are thinking about using estrogen or estrogen with progestin treatments, discuss the benefits and risks with your health care provider. What should I know about heart disease and stroke? Heart disease, heart attack, and stroke become more likely as you age. This may be due, in part, to the hormonal changes that your body experiences during menopause. These can affect how your body processes dietary fats, triglycerides, and cholesterol. Heart attack and stroke are both medical emergencies. There are many things that you can do to help prevent heart disease  and stroke:  Have your blood pressure checked at least every 1-2 years. High blood pressure causes heart disease and increases the risk of stroke.  If you are 77-108 years old, ask your health care provider if you should take aspirin to prevent a heart attack or a stroke.  Do not use any tobacco products, including cigarettes, chewing tobacco, or electronic cigarettes. If you need help quitting, ask your health care provider.  It is important to eat a healthy diet and maintain a healthy weight.  Be sure to include plenty of vegetables, fruits, low-fat dairy products, and lean protein.  Avoid eating foods that are high in solid fats, added sugars, or salt (sodium).  Get regular exercise. This is one of the most important things that you can do for your health.  Try to exercise for at least 150 minutes each week. The type of exercise that you do should increase your heart rate and make you sweat. This is known as moderate-intensity exercise.  Try to do strengthening exercises at least twice each week. Do these in addition to the moderate-intensity exercise.  Know your numbers.Ask your health care provider to check your cholesterol and your blood glucose. Continue to have your blood tested as directed by your health care provider. What should I know about cancer screening? There are several types of cancer. Take the following steps to reduce your risk and to catch any cancer development as early as possible. Breast Cancer  Practice breast self-awareness.  This means understanding how your breasts normally appear and feel.  It also means doing regular breast self-exams. Let your health care provider know about any changes, no matter how small.  If you are 40 or older,  have a clinician do a breast exam (clinical breast exam or CBE) every year. Depending on your age, family history, and medical history, it may be recommended that you also have a yearly breast X-ray (mammogram).  If you  have a family history of breast cancer, talk with your health care provider about genetic screening.  If you are at high risk for breast cancer, talk with your health care provider about having an MRI and a mammogram every year.  Breast cancer (BRCA) gene test is recommended for women who have family members with BRCA-related cancers. Results of the assessment will determine the need for genetic counseling and BRCA1 and for BRCA2 testing. BRCA-related cancers include these types:  Breast. This occurs in males or females.  Ovarian.  Tubal. This may also be called fallopian tube cancer.  Cancer of the abdominal or pelvic lining (peritoneal cancer).  Prostate.  Pancreatic. Cervical, Uterine, and Ovarian Cancer  Your health care provider may recommend that you be screened regularly for cancer of the pelvic organs. These include your ovaries, uterus, and vagina. This screening involves a pelvic exam, which includes checking for microscopic changes to the surface of your cervix (Pap test).  For women ages 21-65, health care providers may recommend a pelvic exam and a Pap test every three years. For women ages 23-65, they may recommend the Pap test and pelvic exam, combined with testing for human papilloma virus (HPV), every five years. Some types of HPV increase your risk of cervical cancer. Testing for HPV may also be done on women of any age who have unclear Pap test results.  Other health care providers may not recommend any screening for nonpregnant women who are considered low risk for pelvic cancer and have no symptoms. Ask your health care provider if a screening pelvic exam is right for you.  If you have had past treatment for cervical cancer or a condition that could lead to cancer, you need Pap tests and screening for cancer for at least 20 years after your treatment. If Pap tests have been discontinued for you, your risk factors (such as having a new sexual partner) need to be reassessed  to determine if you should start having screenings again. Some women have medical problems that increase the chance of getting cervical cancer. In these cases, your health care provider may recommend that you have screening and Pap tests more often.  If you have a family history of uterine cancer or ovarian cancer, talk with your health care provider about genetic screening.  If you have vaginal bleeding after reaching menopause, tell your health care provider.  There are currently no reliable tests available to screen for ovarian cancer. Lung Cancer  Lung cancer screening is recommended for adults 99-83 years old who are at high risk for lung cancer because of a history of smoking. A yearly low-dose CT scan of the lungs is recommended if you:  Currently smoke.  Have a history of at least 30 pack-years of smoking and you currently smoke or have quit within the past 15 years. A pack-year is smoking an average of one pack of cigarettes per day for one year. Yearly screening should:  Continue until it has been 15 years since you quit.  Stop if you develop a health problem that would prevent you from having lung cancer treatment. Colorectal Cancer  This type of cancer can be detected and can often be prevented.  Routine colorectal cancer screening usually begins at age 72 and continues  through age 75.  If you have risk factors for colon cancer, your health care provider may recommend that you be screened at an earlier age.  If you have a family history of colorectal cancer, talk with your health care provider about genetic screening.  Your health care provider may also recommend using home test kits to check for hidden blood in your stool.  A small camera at the end of a tube can be used to examine your colon directly (sigmoidoscopy or colonoscopy). This is done to check for the earliest forms of colorectal cancer.  Direct examination of the colon should be repeated every 5-10 years until  age 75. However, if early forms of precancerous polyps or small growths are found or if you have a family history or genetic risk for colorectal cancer, you may need to be screened more often. Skin Cancer  Check your skin from head to toe regularly.  Monitor any moles. Be sure to tell your health care provider:  About any new moles or changes in moles, especially if there is a change in a mole's shape or color.  If you have a mole that is larger than the size of a pencil eraser.  If any of your family members has a history of skin cancer, especially at a young age, talk with your health care provider about genetic screening.  Always use sunscreen. Apply sunscreen liberally and repeatedly throughout the day.  Whenever you are outside, protect yourself by wearing long sleeves, pants, a wide-brimmed hat, and sunglasses. What should I know about osteoporosis? Osteoporosis is a condition in which bone destruction happens more quickly than new bone creation. After menopause, you may be at an increased risk for osteoporosis. To help prevent osteoporosis or the bone fractures that can happen because of osteoporosis, the following is recommended:  If you are 19-50 years old, get at least 1,000 mg of calcium and at least 600 mg of vitamin D per day.  If you are older than age 50 but younger than age 70, get at least 1,200 mg of calcium and at least 600 mg of vitamin D per day.  If you are older than age 70, get at least 1,200 mg of calcium and at least 800 mg of vitamin D per day. Smoking and excessive alcohol intake increase the risk of osteoporosis. Eat foods that are rich in calcium and vitamin D, and do weight-bearing exercises several times each week as directed by your health care provider. What should I know about how menopause affects my mental health? Depression may occur at any age, but it is more common as you become older. Common symptoms of depression include:  Low or sad  mood.  Changes in sleep patterns.  Changes in appetite or eating patterns.  Feeling an overall lack of motivation or enjoyment of activities that you previously enjoyed.  Frequent crying spells. Talk with your health care provider if you think that you are experiencing depression. What should I know about immunizations? It is important that you get and maintain your immunizations. These include:  Tetanus, diphtheria, and pertussis (Tdap) booster vaccine.  Influenza every year before the flu season begins.  Pneumonia vaccine.  Shingles vaccine. Your health care provider may also recommend other immunizations. This information is not intended to replace advice given to you by your health care provider. Make sure you discuss any questions you have with your health care provider. Document Released: 05/29/2005 Document Revised: 10/25/2015 Document Reviewed: 01/08/2015 Elsevier Interactive Patient   Education  2017 [employer].

## 2016-08-12 LAB — COMPREHENSIVE METABOLIC PANEL
ALT: 15 IU/L (ref 0–32)
AST: 23 IU/L (ref 0–40)
Albumin/Globulin Ratio: 1.8 (ref 1.2–2.2)
Albumin: 4.5 g/dL (ref 3.5–5.5)
Alkaline Phosphatase: 80 IU/L (ref 39–117)
BUN/Creatinine Ratio: 13 (ref 9–23)
BUN: 10 mg/dL (ref 6–24)
Bilirubin Total: 0.4 mg/dL (ref 0.0–1.2)
CO2: 26 mmol/L (ref 18–29)
Calcium: 10 mg/dL (ref 8.7–10.2)
Chloride: 103 mmol/L (ref 96–106)
Creatinine, Ser: 0.75 mg/dL (ref 0.57–1.00)
GFR calc Af Amer: 103 mL/min/{1.73_m2} (ref >59)
GFR calc non Af Amer: 89 mL/min/{1.73_m2} (ref >59)
Globulin, Total: 2.5 g/dL (ref 1.5–4.5)
Glucose: 106 mg/dL — ABNORMAL HIGH (ref 65–99)
Potassium: 4.6 mmol/L (ref 3.5–5.2)
Sodium: 140 mmol/L (ref 134–144)
Total Protein: 7 g/dL (ref 6.0–8.5)

## 2016-08-12 LAB — LIPID PANEL
Chol/HDL Ratio: 2.7 ratio (ref 0.0–4.4)
Cholesterol, Total: 223 mg/dL — ABNORMAL HIGH (ref 100–199)
HDL: 83 mg/dL (ref >39)
LDL Calculated: 127 mg/dL — ABNORMAL HIGH (ref 0–99)
Triglycerides: 64 mg/dL (ref 0–149)
VLDL Cholesterol Cal: 13 mg/dL (ref 5–40)

## 2016-08-12 LAB — CBC
Hematocrit: 38.5 % (ref 34.0–46.6)
Hemoglobin: 12.6 g/dL (ref 11.1–15.9)
MCH: 28.3 pg (ref 26.6–33.0)
MCHC: 32.7 g/dL (ref 31.5–35.7)
MCV: 87 fL (ref 79–97)
Platelets: 376 10*3/uL (ref 150–379)
RBC: 4.45 x10E6/uL (ref 3.77–5.28)
RDW: 14.5 % (ref 12.3–15.4)
WBC: 6.1 10*3/uL (ref 3.4–10.8)

## 2016-08-12 LAB — HEMOGLOBIN A1C
Est. average glucose Bld gHb Est-mCnc: 126 mg/dL
Hgb A1c MFr Bld: 6 % — ABNORMAL HIGH (ref 4.8–5.6)

## 2016-08-12 LAB — HEPATITIS C ANTIBODY: Hep C Virus Ab: <0.1 s/co ratio (ref 0.0–0.9)

## 2016-08-12 LAB — VITAMIN D 25 HYDROXY (VIT D DEFICIENCY, FRACTURES): Vit D, 25-Hydroxy: 34.6 ng/mL (ref 30.0–100.0)

## 2016-08-22 ENCOUNTER — Other Ambulatory Visit: Payer: Self-pay | Admitting: Internal Medicine

## 2016-09-01 ENCOUNTER — Ambulatory Visit (INDEPENDENT_AMBULATORY_CARE_PROVIDER_SITE_OTHER): Payer: 59 | Admitting: Internal Medicine

## 2016-09-01 ENCOUNTER — Encounter: Payer: Self-pay | Admitting: Internal Medicine

## 2016-09-01 DIAGNOSIS — I10 Essential (primary) hypertension: Secondary | ICD-10-CM

## 2016-09-01 MED ORDER — AMLODIPINE BESYLATE 10 MG PO TABS: 10.0000 mg | ORAL_TABLET | Freq: Every day | ORAL | 4 refills | Status: DC

## 2016-09-01 MED ORDER — AMLODIPINE BESYLATE 10 MG PO TABS: 10.0000 mg | ORAL_TABLET | Freq: Every day | ORAL | 1 refills | Status: DC

## 2016-09-01 NOTE — Assessment & Plan Note (Signed)
Better on Norvasc Still not quite at goal but will give her some time to work on diet and exercise Consume a low salt diet

## 2016-09-01 NOTE — Patient Instructions (Signed)
Hypertension °Hypertension is another name for high blood pressure. High blood pressure forces your heart to work harder to pump blood. This can cause problems over time. °There are two numbers in a blood pressure reading. There is a top number (systolic) over a bottom number (diastolic). It is best to have a blood pressure below 120/80. Healthy choices can help lower your blood pressure. You may need medicine to help lower your blood pressure if: °· Your blood pressure cannot be lowered with healthy choices. °· Your blood pressure is higher than 130/80. °Follow these instructions at home: °Eating and drinking  °· If directed, follow the DASH eating plan. This diet includes: °¨ Filling half of your plate at each meal with fruits and vegetables. °¨ Filling one quarter of your plate at each meal with whole grains. Whole grains include whole wheat pasta, brown rice, and whole grain bread. °¨ Eating or drinking low-fat dairy products, such as skim milk or low-fat yogurt. °¨ Filling one quarter of your plate at each meal with low-fat (lean) proteins. Low-fat proteins include fish, skinless chicken, eggs, beans, and tofu. °¨ Avoiding fatty meat, cured and processed meat, or chicken with skin. °¨ Avoiding premade or processed food. °· Eat less than 1,500 mg of salt (sodium) a day. °· Limit alcohol use to no more than 1 drink a day for nonpregnant women and 2 drinks a day for men. One drink equals 12 oz of beer, 5 oz of wine, or 1½ oz of hard liquor. °Lifestyle  °· Work with your doctor to stay at a healthy weight or to lose weight. Ask your doctor what the best weight is for you. °· Get at least 30 minutes of exercise that causes your heart to beat faster (aerobic exercise) most days of the week. This may include walking, swimming, or biking. °· Get at least 30 minutes of exercise that strengthens your muscles (resistance exercise) at least 3 days a week. This may include lifting weights or pilates. °· Do not use any  products that contain nicotine or tobacco. This includes cigarettes and e-cigarettes. If you need help quitting, ask your doctor. °· Check your blood pressure at home as told by your doctor. °· Keep all follow-up visits as told by your doctor. This is important. °Medicines  °· Take over-the-counter and prescription medicines only as told by your doctor. Follow directions carefully. °· Do not skip doses of blood pressure medicine. The medicine does not work as well if you skip doses. Skipping doses also puts you at risk for problems. °· Ask your doctor about side effects or reactions to medicines that you should watch for. °Contact a doctor if: °· You think you are having a reaction to the medicine you are taking. °· You have headaches that keep coming back (recurring). °· You feel dizzy. °· You have swelling in your ankles. °· You have trouble with your vision. °Get help right away if: °· You get a very bad headache. °· You start to feel confused. °· You feel weak or numb. °· You feel faint. °· You get very bad pain in your: °¨ Chest. °¨ Belly (abdomen). °· You throw up (vomit) more than once. °· You have trouble breathing. °Summary °· Hypertension is another name for high blood pressure. °· Making healthy choices can help lower blood pressure. If your blood pressure cannot be controlled with healthy choices, you may need to take medicine. °This information is not intended to replace advice given to you by your   health care provider. Make sure you discuss any questions you have with your health care provider. °Document Released: 09/23/2007 Document Revised: 03/04/2016 Document Reviewed: 03/04/2016 °Elsevier Interactive Patient Education © 2017 [employer] ° °

## 2016-09-01 NOTE — Progress Notes (Signed)
Subjective:    Patient ID: Courtney Guzman, female    DOB: [birth year], 56 y.o.   MRN: [id number]  HPI  Pt presents to the clinic today for 3 week follow up of HTN. She was started on Norvasc 10 mg daily. She has been taking the medication as prescribed and denies adverse side effects. Her BP today is 128/78. ECG from 07/2008 reviewed.  Review of Systems      Past Medical History:  Diagnosis Date  . Allergy    seasonal  . Arthritis   . Diabetes mellitus without complication ([place])   . Fibromyalgia    dr. Marveen Reeks  . GERD (gastroesophageal reflux disease)   . Hyperlipidemia   . Wheezes    with season changes     Current Outpatient Prescriptions  Medication Sig Dispense Refill  . albuterol (VENTOLIN HFA) 108 (90 Base) MCG/ACT inhaler INHALE 2 PUFFS INTO THE LUNGS EVERY 4 (FOUR) HOURS AS NEEDED FOR WHEEZING. 18 each 5  . amLODipine (NORVASC) 10 MG tablet Take 1 tablet (10 mg total) by mouth daily. 30 tablet 0  . calcium carbonate (OS-CAL) 600 MG TABS Take 600 mg by mouth 2 (two) times daily with a meal.      . ferrous sulfate 325 (65 FE) MG tablet Take 325 mg by mouth daily with breakfast.    . fexofenadine (ALLEGRA) 30 MG tablet Take 30 mg by mouth 2 (two) times daily.    . fish oil-omega-3 fatty acids 1000 MG capsule Take 2 g by mouth daily.    Marland Kitchen gabapentin (NEURONTIN) 100 MG capsule TAKE 2 CAPSULES BY MOUTH 3 TIMES DAILY 540 capsule 0  . gabapentin (NEURONTIN) 300 MG capsule Take 300 mg by mouth 3 (three) times daily.      Nyoka Cowden Tea, Camillia sinensis, (GREEN TEA PO) Take by mouth.      . methocarbamol (ROBAXIN) 500 MG tablet TAKE 1 TABLET BY MOUTH AT 7 A.M. AND 1 TABLET AT 2 P.M. AS NEEDED 60 tablet 4  . Multiple Vitamin (MULTIVITAMIN) tablet Take 1 tablet by mouth daily.    Marland Kitchen omeprazole (PRILOSEC) 40 MG capsule TAKE 1 CAPSULE (40 MG TOTAL) BY MOUTH DAILY. 30 capsule 10  . OVER THE COUNTER MEDICATION Cholester Reg II    . tiZANidine (ZANAFLEX) 4 MG tablet Take 1 tablet (4 mg  total) by mouth at bedtime. 30 tablet 2  . traMADol (ULTRAM) 50 MG tablet Take by mouth every 12 (twelve) hours as needed.    . TRAVATAN Z 0.004 % SOLN ophthalmic solution     . VOLTAREN 1 % GEL Reported on 07/19/2015     No current facility-administered medications for this visit.     No Known Allergies  Family History  Problem Relation Age of Onset  . Hypertension Mother   . Diabetes Sister   . Cancer Maternal Grandmother        OVARIAN  . Ovarian cancer Maternal Grandmother   . Colon cancer Neg Hx   . Colon polyps Neg Hx     Social History   Social History  . Marital status: Married    Spouse name: N/A  . Number of children: N/A  . Years of education: N/A   Occupational History  . Not on file.   Social History Main Topics  . Smoking status: Never Smoker  . Smokeless tobacco: Never Used  . Alcohol use No  . Drug use: No  . Sexual activity: Yes    Partners: Male  Birth control/ protection: Surgical     Comment: TUBAL LIGATION, 1st sexual encounter at 62, has had 5    Other Topics Concern  . Not on file   Social History Narrative  . No narrative on file     Constitutional: Denies fever, malaise, fatigue, headache or abrupt weight changes.  Respiratory: Denies difficulty breathing, shortness of breath, cough or sputum production.   Cardiovascular: Denies chest pain, chest tightness, palpitations or swelling in the hands or feet.   No other specific complaints in a complete review of systems (except as listed in HPI above).  Objective:   Physical Exam  BP 128/78   Pulse 70   Temp 98.4 F (36.9 C) (Oral)   Wt 162 lb 12 oz (73.8 kg)   LMP 02/24/2008   SpO2 97%   BMI 30.25 kg/m  Wt Readings from Last 3 Encounters:  09/01/16 162 lb 12 oz (73.8 kg)  08/11/16 164 lb (74.4 kg)  07/02/16 167 lb (75.8 kg)    General: Appears her stated age, obese, in NAD. Cardiovascular: Normal rate and rhythm. S1,S2 noted.  No murmur, rubs or gallops noted.    Pulmonary/Chest: Normal effort and positive vesicular breath sounds. No respiratory distress. No wheezes, rales or ronchi noted.  Neurological: Alert and oriented.   BMET    Component Value Date/Time   NA 140 08/11/2016 0833   K 4.6 08/11/2016 0833   CL 103 08/11/2016 0833   CO2 26 08/11/2016 0833   GLUCOSE 106 (H) 08/11/2016 0833   GLUCOSE 100 (H) 03/03/2016 0912   BUN 10 08/11/2016 0833   CREATININE 0.75 08/11/2016 0833   CREATININE 0.66 04/24/2014 0859   CALCIUM 10.0 08/11/2016 0833   GFRNONAA 89 08/11/2016 0833   GFRAA 103 08/11/2016 0833    Lipid Panel     Component Value Date/Time   CHOL 223 (H) 08/11/2016 0833   TRIG 64 08/11/2016 0833   HDL 83 08/11/2016 0833   CHOLHDL 2.7 08/11/2016 0833   CHOLHDL 3 03/03/2016 0912   VLDL 14.2 03/03/2016 0912   LDLCALC 127 (H) 08/11/2016 0833    CBC    Component Value Date/Time   WBC 6.1 08/11/2016 0833   WBC 6.1 03/03/2016 0912   RBC 4.45 08/11/2016 0833   RBC 4.49 03/03/2016 0912   HGB 12.7 03/03/2016 0912   HCT 38.5 08/11/2016 0833   PLT 376 08/11/2016 0833   MCV 87 08/11/2016 0833   MCH 28.3 08/11/2016 0833   MCH 27.8 04/24/2014 0859   MCHC 32.7 08/11/2016 0833   MCHC 32.9 03/03/2016 0912   RDW 14.5 08/11/2016 0833   LYMPHSABS 3.5 (H) 07/02/2016 0943   MONOABS 0.5 04/24/2014 0859   EOSABS 0.1 07/02/2016 0943   BASOSABS 0.0 07/02/2016 0943    Hgb A1C Lab Results  Component Value Date   HGBA1C 6.0 (H) 08/11/2016            Assessment & Plan:

## 2016-09-02 ENCOUNTER — Encounter: Payer: Self-pay | Admitting: Gynecology

## 2016-09-04 ENCOUNTER — Other Ambulatory Visit: Payer: Self-pay | Admitting: Internal Medicine

## 2016-09-04 DIAGNOSIS — J4520 Mild intermittent asthma, uncomplicated: Secondary | ICD-10-CM

## 2016-09-28 ENCOUNTER — Other Ambulatory Visit: Payer: Self-pay | Admitting: Rheumatology

## 2016-09-28 NOTE — Telephone Encounter (Signed)
Last Visit: 07/02/16 Next Visit 12/31/16  Okay to refill Tizanidine?

## 2016-12-28 NOTE — Progress Notes (Signed)
Office Visit Note  Patient: Courtney Guzman             Date of Birth: 02-Jan-1961           MRN: [id number]             PCP: Jearld Fenton, NP Referring: Jearld Fenton, NP Visit Date: 01/07/2017 Occupation: @GUAROCC @    Subjective:  Neck pain.   History of Present Illness: Courtney Guzman is a 56 y.o. female with history of fibromyalgia syndrome. She states she continues to have some pain and discomfort. She's been having increased pain around her neck. She's describes her pain on the scale of 0-10 about 7. She recently switched from methocarbamol to Skelaxin due to shortage of the medication. It has been working well for her. She takes tramadol only occasionally on when necessary basis. She states she has not taken it in a long time. She had right-sided lower back pain couple of weeks ago which has eased off. She states she took some Aleve for that.  Activities of Daily Living:  Patient reports morning stiffness for 15  minutes.   Patient Reports nocturnal pain.  Difficulty dressing/grooming: Denies Difficulty climbing stairs: Denies Difficulty getting out of chair: Reports Difficulty using hands for taps, buttons, cutlery, and/or writing: Denies   Review of Systems  Constitutional: Negative.  Negative for fatigue.  HENT: Negative for mouth dryness.   Eyes: Negative for dryness.  Respiratory: Negative.  Negative for cough, shortness of breath and difficulty breathing.   Cardiovascular: Negative.  Positive for hypertension. Negative for palpitations and irregular heartbeat.  Gastrointestinal: Negative.  Negative for blood in stool, constipation and diarrhea.  Endocrine: Negative.   Genitourinary: Negative.  Negative for nocturia.  Musculoskeletal: Positive for myalgias and myalgias. Negative for arthralgias, joint pain, joint swelling, muscle weakness and morning stiffness.  Skin: Negative.  Negative for color change, rash, hair loss, ulcers and sensitivity to sunlight.    Neurological: Negative.  Negative for headaches.  Hematological: Negative.  Negative for swollen glands.  Psychiatric/Behavioral: Negative.  Negative for depressed mood and sleep disturbance. The patient is not nervous/anxious.     PMFS History:  Patient Active Problem List   Diagnosis Date Noted  . Essential hypertension 08/11/2016  . Primary insomnia 07/01/2016  . 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 Seasonal allergic rhinitis due to pollen 03/03/2016  . Type 2 diabetes mellitus without complication, without long-term current use of insulin ([place]) 06/14/2015  . Hyperlipidemia 05/10/2014  . Family history of ovarian cancer 04/08/2012  . Osteopenia 03/22/2012  . GERD (gastroesophageal reflux disease)   . Fibromyalgia syndrome 07/26/2008    Past Medical History:  Diagnosis Date  . Allergy    seasonal  . Arthritis   . Diabetes mellitus without complication ([place])   . Fibromyalgia    dr. Marveen Reeks  . GERD (gastroesophageal reflux disease)   . Hyperlipidemia   . Wheezes    with season changes     Family History  Problem Relation Age of Onset  . Hypertension Mother   . Diabetes Sister   . Cancer Maternal Grandmother        OVARIAN  . Ovarian cancer Maternal Grandmother   . Colon cancer Neg Hx   . Colon polyps Neg Hx    Past Surgical History:  Procedure Laterality Date  . [place]   with BTL  . COLONOSCOPY    . COLONOSCOPY W/ POLYPECTOMY  2006   BENIGN ADENOMA   .  POLYPECTOMY    . TONSILLECTOMY AND ADENOIDECTOMY     Social History   Social History Narrative  . No narrative on file     Objective: Vital Signs: BP 140/85   Pulse 69   Resp 14   Ht 5' (1.524 m)   Wt 171 lb (77.6 kg)   LMP 02/24/2008   BMI 33.40 kg/m    Physical Exam  Constitutional: She is oriented to person, place, and time. She appears well-developed and well-nourished.  HENT:  Head: Normocephalic and atraumatic.  Eyes: Conjunctivae and EOM are normal.  Neck: Normal  range of motion.  Cardiovascular: Normal rate, regular rhythm, normal heart sounds and intact distal pulses.   Pulmonary/Chest: Effort normal and breath sounds normal.  Abdominal: Soft. Bowel sounds are normal.  Lymphadenopathy:    She has no cervical adenopathy.  Neurological: She is alert and oriented to person, place, and time.  Skin: Skin is warm and dry. Capillary refill takes less than 2 seconds.  Psychiatric: She has a normal mood and affect. Her behavior is normal.  Nursing note and vitals reviewed.    Musculoskeletal Exam: C-spine and thoracic lumbar spine good range of motion with discomfort. She had bilateral trapezius is spasm. Shoulder joints elbow joints wrist joint MCPs PIPs DIPs with good range of motion with no synovitis. Hip joints knee joints ankles MTPs PIPs with good range of motion with no synovitis.  CDAI Exam: No CDAI exam completed.    Investigation: No additional findings. CBC Latest Ref Rng & Units 08/11/2016 07/02/2016 03/03/2016  WBC 3.4 - 10.8 x10E3/uL 6.1 5.8 6.1  Hemoglobin 11.1 - 15.9 g/dL 12.6 12.3 12.7  Hematocrit 34.0 - 46.6 % 38.5 37.9 38.5  Platelets 150 - 379 x10E3/uL 376 332 335.0   CMP Latest Ref Rng & Units 08/11/2016 07/02/2016 03/03/2016  Glucose 65 - 99 mg/dL 106(H) 99 100(H)  BUN 6 - 24 mg/dL 10 12 13   Creatinine 0.57 - 1.00 mg/dL 0.75 0.64 0.69  Sodium 134 - 144 mmol/L 140 144 141  Potassium 3.5 - 5.2 mmol/L 4.6 4.4 3.8  Chloride 96 - 106 mmol/L 103 106 106  CO2 18 - 29 mmol/L 26 24 29   Calcium 8.7 - 10.2 mg/dL 10.0 9.8 9.8  Total Protein 6.0 - 8.5 g/dL 7.0 6.9 7.2  Total Bilirubin 0.0 - 1.2 mg/dL 0.4 <0.2 0.5  Alkaline Phos 39 - 117 IU/L 80 86 73  AST 0 - 40 IU/L 23 25 18   ALT 0 - 32 IU/L 15 16 12     Imaging: No results found.  Speciality Comments: No specialty comments available.    Procedures:  No procedures performed Allergies: Patient has no known allergies.   Assessment / Plan:     Visit Diagnoses: Fibromyalgia:  She is generalized pain and discomfort and positive tender points. Will refer her to physical therapy.  History of fatigue: Due to underlying insomnia.  History of insomnia: Good sleep hygiene was discussed.  Neck pain: She is bilateral trapezius is spasm and pain. She has used Lidoderm patches in the past which are helpful. I will give her a prescription for Lidoderm patches today.  Lower back pain: She's been having some discomfort in her lower back. It flared about 2 weeks ago and the symptoms have improved to some extent. I will give her back exercises. I will also refer her to ENT. Therapies for neck and lower back pain. She's been taking Skelaxin 1 AM and 1 noontime and then tizanidine 4 mg  by mouth daily at bedtime when necessary .  Her other medical problems are listed as follows:  History of gastroesophageal reflux (GERD)  History of bilateral carpal tunnel release  History of prediabetes  Raised intraocular pressure, unspecified laterality  History of hypertension  History of hyperlipidemia    Orders: Orders Placed This Encounter  Procedures  . Ambulatory referral to Physical Therapy   Meds ordered this encounter  Medications  . lidocaine (LIDODERM) 5 % 3 patch    Face-to-face time spent with patient was 30 minutes. Greater than 50% of time was spent in counseling and coordination of care.  Follow-Up Instructions: Return in about 6 months (around 07/07/2017) for FMS .   Bo Merino, MD  Note - This record has been created using Editor, commissioning.  Chart creation errors have been sought, but may not always  have been located. Such creation errors do not reflect on  the standard of medical care.

## 2016-12-30 ENCOUNTER — Other Ambulatory Visit: Payer: Self-pay | Admitting: Rheumatology

## 2016-12-30 NOTE — Telephone Encounter (Signed)
Patient needs a refill of methocarbamol but was told by the pharmacy that the medication is backordered and has been for a while. She states they told her they sent [place] a fax. [facility] uses [facility] on [address]. Please advise.

## 2016-12-31 ENCOUNTER — Ambulatory Visit: Payer: 59 | Admitting: Rheumatology

## 2016-12-31 MED ORDER — METAXALONE 800 MG PO TABS: 800.0000 mg | ORAL_TABLET | Freq: Two times a day (BID) | ORAL | 2 refills | Status: DC | PRN

## 2016-12-31 NOTE — Telephone Encounter (Signed)
Fax received via fax regarding Methocarbamol stating it is on backorder and requesting a new medication. Per Dr. Estanislado Pandy okay to call to send in Skelaxin. Patient advised.

## 2017-01-07 ENCOUNTER — Encounter: Payer: Self-pay | Admitting: Rheumatology

## 2017-01-07 ENCOUNTER — Ambulatory Visit (INDEPENDENT_AMBULATORY_CARE_PROVIDER_SITE_OTHER): Payer: 59 | Admitting: Rheumatology

## 2017-01-07 VITALS — BP 140/85 | HR 69 | Resp 14 | Ht 60.0 in | Wt 171.0 lb

## 2017-01-07 DIAGNOSIS — H40059 Ocular hypertension, unspecified eye: Secondary | ICD-10-CM

## 2017-01-07 DIAGNOSIS — Z87898 Personal history of other specified conditions: Secondary | ICD-10-CM | POA: Diagnosis not present

## 2017-01-07 DIAGNOSIS — M797 Fibromyalgia: Secondary | ICD-10-CM | POA: Diagnosis not present

## 2017-01-07 DIAGNOSIS — Z9889 Other specified postprocedural states: Secondary | ICD-10-CM

## 2017-01-07 DIAGNOSIS — Z8719 Personal history of other diseases of the digestive system: Secondary | ICD-10-CM

## 2017-01-07 DIAGNOSIS — Z8679 Personal history of other diseases of the circulatory system: Secondary | ICD-10-CM

## 2017-01-07 DIAGNOSIS — M542 Cervicalgia: Secondary | ICD-10-CM

## 2017-01-07 DIAGNOSIS — Z8639 Personal history of other endocrine, nutritional and metabolic disease: Secondary | ICD-10-CM

## 2017-01-07 MED ORDER — LIDOCAINE 5 % EX PTCH: 3.0000 | MEDICATED_PATCH | CUTANEOUS | Status: DC

## 2017-01-11 ENCOUNTER — Other Ambulatory Visit: Payer: Self-pay | Admitting: Rheumatology

## 2017-01-11 MED ORDER — LIDOCAINE 5 % EX PTCH: 1.0000 | MEDICATED_PATCH | CUTANEOUS | 2 refills | Status: DC

## 2017-01-11 NOTE — Telephone Encounter (Signed)
Resent to Pharmacy

## 2017-01-11 NOTE — Telephone Encounter (Signed)
Patient states pharmacy did not receive rx for Lidocaine patches on Thursday. [facility] uses [facility] on [address] in [place]. Please call to advise.

## 2017-01-12 DIAGNOSIS — E119 Type 2 diabetes mellitus without complications: Secondary | ICD-10-CM | POA: Diagnosis not present

## 2017-01-18 ENCOUNTER — Other Ambulatory Visit: Payer: Self-pay | Admitting: Rheumatology

## 2017-01-18 NOTE — Telephone Encounter (Signed)
Last Visit: 01/07/17 Next Visit: 07/06/16  Okay to refill per Dr. Estanislado Pandy

## 2017-01-27 ENCOUNTER — Telehealth: Payer: Self-pay | Admitting: Internal Medicine

## 2017-01-27 NOTE — Telephone Encounter (Signed)
Pt has appt with Glenda Chroman FNP 01/29/17 at [place].

## 2017-01-27 NOTE — Telephone Encounter (Signed)
°  Patient Name: Courtney Guzman  DOB: 18-Sep-1960    Initial Comment Caller states taking meds for muscle spasms in back, also taking fish oil, her lips are dark, asking if she is having a reaction to meds   Nurse Assessment  Nurse: Zorita Pang, RN, Neoma Laming Date/Time (Eastern Time): 01/27/2017 10:19:25 AM  Confirm and document reason for call. If symptomatic, describe symptoms. ---The caller states that her lips are very dark which is abnormal. She states that she is started taking Metaxalone (recently started, Gabapentin, amlodipine, iron, caltrate, and cholesterol.  Does the patient have any new or worsening symptoms? ---Yes  Will a triage be completed? ---Yes  Related visit to physician within the last 2 weeks? ---Yes  Does the PT have any chronic conditions? (i.e. diabetes, asthma, etc.) ---Yes  List chronic conditions. ---fibromyalgia, pre-diabetes, increased intraophthalmic pressure  Is this a behavioral health or substance abuse call? ---No     Guidelines    Guideline Title Affirmed Question Affirmed Notes       Final Disposition User   Clinical Call Womble, RN, Neoma Laming    Comments  All of the caller's medications were checked to see if there were interactions that may be causing the discoloration of her lips. She denies swelling but states that they are tingly. She denies applying any new products on her lips and states that she uses chapstick and vaseline. This nurse used drugs.com to research her medications. The caller was connected to the office to make an appointment to discuss these changes with her lip discoloration. She has a difficult schedule and this nurse thought that it may be better to let her discuss directly with the scheduling person and was warm transferred. (The patient also stated that she is taking an iron supplement from [facility]. Omega fish oil from [facility] brand, cholesterol lowering supplement and an eye drop nightly that she couldn't remember the name.

## 2017-01-29 ENCOUNTER — Ambulatory Visit (INDEPENDENT_AMBULATORY_CARE_PROVIDER_SITE_OTHER): Payer: 59 | Admitting: Family Medicine

## 2017-01-29 ENCOUNTER — Encounter: Payer: Self-pay | Admitting: Family Medicine

## 2017-01-29 VITALS — BP 138/78 | HR 70 | Temp 98.6°F | Wt 167.5 lb

## 2017-01-29 DIAGNOSIS — E875 Hyperkalemia: Secondary | ICD-10-CM

## 2017-01-29 DIAGNOSIS — L819 Disorder of pigmentation, unspecified: Secondary | ICD-10-CM | POA: Diagnosis not present

## 2017-01-29 LAB — COMPREHENSIVE METABOLIC PANEL
ALT: 12 U/L (ref 0–35)
AST: 18 U/L (ref 0–37)
Albumin: 4.2 g/dL (ref 3.5–5.2)
Alkaline Phosphatase: 67 U/L (ref 39–117)
BUN: 12 mg/dL (ref 6–23)
CO2: 29 mEq/L (ref 19–32)
Calcium: 9.5 mg/dL (ref 8.4–10.5)
Chloride: 109 mEq/L (ref 96–112)
Creatinine, Ser: 0.69 mg/dL (ref 0.40–1.20)
GFR: 112.77 mL/min (ref >60.00)
Glucose, Bld: 109 mg/dL — ABNORMAL HIGH (ref 70–99)
Potassium: 5.4 mEq/L — ABNORMAL HIGH (ref 3.5–5.1)
Sodium: 144 mEq/L (ref 135–145)
Total Bilirubin: 0.3 mg/dL (ref 0.2–1.2)
Total Protein: 7.3 g/dL (ref 6.0–8.3)

## 2017-01-29 LAB — CBC
HCT: 40.2 % (ref 36.0–46.0)
Hemoglobin: 12.9 g/dL (ref 12.0–15.0)
MCHC: 32.1 g/dL (ref 30.0–36.0)
MCV: 88.1 fl (ref 78.0–100.0)
Platelets: 322 10*3/uL (ref 150.0–400.0)
RBC: 4.56 Mil/uL (ref 3.87–5.11)
RDW: 14.1 % (ref 11.5–15.5)
WBC: 4.9 10*3/uL (ref 4.0–10.5)

## 2017-01-29 NOTE — Patient Instructions (Signed)
I will notify you of lab results next week  Wear daily sunscreen and keep lips moist

## 2017-01-29 NOTE — Progress Notes (Signed)
Subjective:    Patient ID: Courtney Guzman, female    DOB: June 25, 1960, 56 y.o.   MRN: [id number]  HPI This is a 56 yo female who presents today with change in color of her lips and change in fit of her dentures. She noticed that her bilateral lips have darkened over the last several weeks. She has recently started [facility] and has changed her fish oil brand. Lips without swelling or pain, feel a little tingly. No rash or skin changes on rest of body. Otherwise feels same for her. No family history of skin discoloration to her knowledge. She thinks that she shape of her mouth is different because she went to her dentist and had to have her dentures adjusted.    Current Outpatient Prescriptions:  .  amLODipine (NORVASC) 10 MG tablet, Take 1 tablet (10 mg total) by mouth daily., Disp: 90 tablet, Rfl: 1 .  calcium carbonate (OS-CAL) 600 MG TABS, Take 600 mg by mouth 2 (two) times daily with a meal.  , Disp: , Rfl:  .  ferrous sulfate 325 (65 FE) MG tablet, Take 325 mg by mouth daily with breakfast., Disp: , Rfl:  .  fexofenadine (ALLEGRA) 30 MG tablet, Take 30 mg by mouth 2 (two) times daily., Disp: , Rfl:  .  fish oil-omega-3 fatty acids 1000 MG capsule, Take 2 g by mouth daily., Disp: , Rfl:  .  gabapentin (NEURONTIN) 100 MG capsule, TAKE 2 CAPSULES BY MOUTH 3 TIMES DAILY, Disp: 540 capsule, Rfl: 0 .  Green Tea, Camillia sinensis, (GREEN TEA PO), Take by mouth.  , Disp: , Rfl:  .  lidocaine (LIDODERM) 5 %, Place 1 patch onto the skin daily. Remove & Discard patch within 12 hours or as directed by MD, Disp: 30 patch, Rfl: 2 .  metaxalone (SKELAXIN) 800 MG tablet, Take 1 tablet (800 mg total) by mouth 2 (two) times daily as needed for muscle spasms., Disp: 60 tablet, Rfl: 2 .  Multiple Vitamin (MULTIVITAMIN) tablet, Take 1 tablet by mouth daily., Disp: , Rfl:  .  omeprazole (PRILOSEC) 40 MG capsule, TAKE 1 CAPSULE (40 MG TOTAL) BY MOUTH DAILY., Disp: 30 capsule, Rfl: 10 .  OVER THE COUNTER MEDICATION,  Cholester Reg II, Disp: , Rfl:  .  tiZANidine (ZANAFLEX) 4 MG capsule, Take 4 mg by mouth at bedtime as needed. , Disp: , Rfl:  .  traMADol (ULTRAM) 50 MG tablet, Take by mouth every 12 (twelve) hours as needed., Disp: , Rfl:  .  TRAVATAN Z 0.004 % SOLN ophthalmic solution, , Disp: , Rfl:  .  VENTOLIN HFA 108 (90 Base) MCG/ACT inhaler, INHALE 2 PUFFS INTO THE LUNGS EVERY 4 (FOUR) HOURS AS NEEDED FOR WHEEZING., Disp: 18 Inhaler, Rfl: 3   Past Medical History:  Diagnosis Date  . Allergy    seasonal  . Arthritis   . Diabetes mellitus without complication ([place])   . Fibromyalgia    dr. Marveen Reeks  . GERD (gastroesophageal reflux disease)   . Hyperlipidemia   . Wheezes    with season changes    Past Surgical History:  Procedure Laterality Date  . [place]   with BTL  . COLONOSCOPY    . COLONOSCOPY W/ POLYPECTOMY  2006   BENIGN ADENOMA   . POLYPECTOMY    . TONSILLECTOMY AND ADENOIDECTOMY        Review of Systems Per HPI    Objective:   Physical Exam  Constitutional: She appears well-developed and well-nourished.  No distress.  HENT:  Head: Normocephalic and atraumatic.  Both lips with some hyperpigmentation, difficult to determine degree from baseline since I have not seen her before. Inner portion of lips pink. No raised area. No buccal discoloration.   Skin: Skin is warm and dry. She is not diaphoretic.  No rash on visible parts of skin- face, neck, hands.   Psychiatric: She has a normal mood and affect. Her behavior is normal. Judgment and thought content normal.  Vitals reviewed.     BP 138/78   Pulse 70   Temp 98.6 F (37 C) (Oral)   Wt 167 lb 8 oz (76 kg)   LMP 02/24/2008   SpO2 98%   BMI 32.71 kg/m  Wt Readings from Last 3 Encounters:  01/29/17 167 lb 8 oz (76 kg)  01/07/17 171 lb (77.6 kg)  09/01/16 162 lb 12 oz (73.8 kg)       Assessment & Plan:  1. Discoloration of skin of face - did not see this as side effect of her meds, except eye  drop (Travatan) can cause peri-orbital discoloration.  - difficult to discern in patient I have not seen before. Encouraged her to wear sunscreen, notify office if worsening or new symptoms - she has a dermatologist and will make an appointment if she feels it is getting worse.  - CBC - Comprehensive metabolic panel   Clarene Reamer, FNP-BC  [facility]  01/29/2017 8:37 AM

## 2017-01-29 NOTE — Addendum Note (Signed)
Addended by: Clarene Reamer B on: 01/29/2017 01:33 PM   Modules accepted: Orders

## 2017-02-09 DIAGNOSIS — L818 Other specified disorders of pigmentation: Secondary | ICD-10-CM | POA: Diagnosis not present

## 2017-02-09 DIAGNOSIS — L258 Unspecified contact dermatitis due to other agents: Secondary | ICD-10-CM | POA: Diagnosis not present

## 2017-03-31 LAB — HM DIABETES EYE EXAM

## 2017-04-17 ENCOUNTER — Other Ambulatory Visit: Payer: Self-pay | Admitting: Rheumatology

## 2017-04-19 NOTE — Telephone Encounter (Signed)
Last Visit: 01/07/17 Next Visit: 07/06/16  Okay to refill per Dr. Estanislado Pandy

## 2017-05-05 ENCOUNTER — Other Ambulatory Visit: Payer: Self-pay | Admitting: Internal Medicine

## 2017-05-05 DIAGNOSIS — Z1231 Encounter for screening mammogram for malignant neoplasm of breast: Secondary | ICD-10-CM

## 2017-05-11 DIAGNOSIS — H401111 Primary open-angle glaucoma, right eye, mild stage: Secondary | ICD-10-CM | POA: Diagnosis not present

## 2017-05-25 DIAGNOSIS — H401111 Primary open-angle glaucoma, right eye, mild stage: Secondary | ICD-10-CM | POA: Diagnosis not present

## 2017-06-01 ENCOUNTER — Ambulatory Visit
Admission: RE | Admit: 2017-06-01 | Discharge: 2017-06-01 | Disposition: A | Discharge status: Home / Self Care | Payer: 59 | Source: Ambulatory Visit | Attending: Internal Medicine | Admitting: Internal Medicine

## 2017-06-01 DIAGNOSIS — Z1231 Encounter for screening mammogram for malignant neoplasm of breast: Secondary | ICD-10-CM

## 2017-06-23 NOTE — Progress Notes (Signed)
Office Visit Note  Patient: Courtney Guzman             Date of Birth: Aug 08, 1960           MRN: [id number]             PCP: Jearld Fenton, NP Referring: Jearld Fenton, NP Visit Date: 07/06/2017 Occupation: @GUAROCC @    Subjective: Generalized pain.   History of Present Illness: Courtney Guzman is a 57 y.o. female with history of fibromyalgia.  She states in general she is doing better.  She is off-and-on flares of fibromyalgia with generalized pain.  She also has hyperalgesia.  She states she takes muscle relaxers only on as needed basis.  She feels some tightness around her trapezius area.  Activities of Daily Living:  Patient reports morning stiffness for 10 minutes.   Patient Reports nocturnal pain.  Difficulty dressing/grooming: Denies Difficulty climbing stairs: Denies Difficulty getting out of chair: Denies Difficulty using hands for taps, buttons, cutlery, and/or writing: Denies   Review of Systems  Constitutional: Positive for fatigue. Negative for night sweats, weight gain, weight loss and weakness.  HENT: Negative for mouth sores, trouble swallowing, trouble swallowing, mouth dryness and nose dryness.   Eyes: Negative for pain, redness, visual disturbance and dryness.  Respiratory: Negative for cough, shortness of breath, wheezing and difficulty breathing.   Cardiovascular: Negative for chest pain, palpitations, hypertension, irregular heartbeat and swelling in legs/feet.  Gastrointestinal: Negative for blood in stool, constipation and diarrhea.  Endocrine: Negative for cold intolerance and increased urination.  Genitourinary: Negative for difficulty urinating and vaginal dryness.  Musculoskeletal: Positive for arthralgias, joint pain, morning stiffness and muscle tenderness. Negative for joint swelling, myalgias, muscle weakness and myalgias.  Skin: Negative for color change, rash, hair loss, skin tightness, ulcers and sensitivity to sunlight.  Allergic/Immunologic:  Negative for susceptible to infections.  Neurological: Negative for dizziness, numbness, memory loss and night sweats.  Hematological: Negative for bruising/bleeding tendency and swollen glands.  Psychiatric/Behavioral: Negative for depressed mood and sleep disturbance. The patient is not nervous/anxious.     PMFS History:  Patient Active Problem List   Diagnosis Date Noted  . Essential hypertension 08/11/2016  . Primary insomnia 07/01/2016  . 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 Seasonal allergic rhinitis due to pollen 03/03/2016  . Type 2 diabetes mellitus without complication, without long-term current use of insulin ([place]) 06/14/2015  . Hyperlipidemia 05/10/2014  . Family history of ovarian cancer 04/08/2012  . Osteopenia 03/22/2012  . GERD (gastroesophageal reflux disease)   . Fibromyalgia syndrome 07/26/2008    Past Medical History:  Diagnosis Date  . Allergy    seasonal  . Arthritis   . Diabetes mellitus without complication ([place])   . Fibromyalgia    dr. Marveen Reeks  . GERD (gastroesophageal reflux disease)   . Hyperlipidemia   . Hypertension   . Wheezes    with season changes     Family History  Problem Relation Age of Onset  . Hypertension Mother   . Diabetes Sister   . Cancer Maternal Grandmother        OVARIAN  . Ovarian cancer Maternal Grandmother   . Colon cancer Neg Hx   . Colon polyps Neg Hx    Past Surgical History:  Procedure Laterality Date  . [place]   with BTL  . COLONOSCOPY    . COLONOSCOPY W/ POLYPECTOMY  2006   BENIGN ADENOMA   . POLYPECTOMY    . TONSILLECTOMY AND  ADENOIDECTOMY     Social History   Social History Narrative  . Not on file     Objective: Vital Signs: BP (!) 159/101 (BP Location: Left Arm, Patient Position: Sitting, Cuff Size: Normal)   Pulse 68   Resp 14   Ht 5' (1.524 m)   Wt 176 lb (79.8 kg)   LMP 02/24/2008   BMI 34.37 kg/m    Physical Exam  Constitutional: She is oriented to person,  place, and time. She appears well-developed and well-nourished.  HENT:  Head: Normocephalic and atraumatic.  Eyes: Conjunctivae and EOM are normal.  Neck: Normal range of motion.  Cardiovascular: Normal rate, regular rhythm, normal heart sounds and intact distal pulses.  Pulmonary/Chest: Effort normal and breath sounds normal.  Abdominal: Soft. Bowel sounds are normal.  Lymphadenopathy:    She has no cervical adenopathy.  Neurological: She is alert and oriented to person, place, and time.  Skin: Skin is warm and dry. Capillary refill takes less than 2 seconds.  Psychiatric: She has a normal mood and affect. Her behavior is normal.  Nursing note and vitals reviewed.    Musculoskeletal Exam: C-spine thoracic and lumbar spine good range of motion.  She has a stiffness and discomfort in her bilateral trapezius area.  Shoulder joints elbow joints wrist joint MCPs PIPs DIPs are good range of motion.  Hip joints knee joints ankles MTPs PIPs DIPs with good range of motion.  She had few tender points.  She also has some  CDAI Exam: No CDAI exam completed.    Investigation: No additional findings. CBC Latest Ref Rng & Units 01/29/2017 08/11/2016 07/02/2016  WBC 4.0 - 10.5 K/uL 4.9 6.1 5.8  Hemoglobin 12.0 - 15.0 g/dL 12.9 12.6 12.3  Hematocrit 36.0 - 46.0 % 40.2 38.5 37.9  Platelets 150.0 - 400.0 K/uL 322.0 376 332   CMP Latest Ref Rng & Units 01/29/2017 08/11/2016 07/02/2016  Glucose 70 - 99 mg/dL 109(H) 106(H) 99  BUN 6 - 23 mg/dL 12 10 12   Creatinine 0.40 - 1.20 mg/dL 0.69 0.75 0.64  Sodium 135 - 145 mEq/L 144 140 144  Potassium 3.5 - 5.1 mEq/L 5.4(H) 4.6 4.4  Chloride 96 - 112 mEq/L 109 103 106  CO2 19 - 32 mEq/L 29 26 24   Calcium 8.4 - 10.5 mg/dL 9.5 10.0 9.8  Total Protein 6.0 - 8.3 g/dL 7.3 7.0 6.9  Total Bilirubin 0.2 - 1.2 mg/dL 0.3 0.4 <0.2  Alkaline Phos 39 - 117 U/L 67 80 86  AST 0 - 37 U/L 18 23 25   ALT 0 - 35 U/L 12 15 16     Imaging: No results found.  Speciality  Comments: No specialty comments available.    Procedures:  No procedures performed Allergies: Patient has no known allergies.   Assessment / Plan:     Visit Diagnoses: Fibromyalgia syndrome: She continues to have some flares of fibromyalgia with generalized pain and discomfort.  She has a lot of spasm in the trapezius area.  She has increased intraocular pressure I would avoid cortisone injections.  Have given her a handout on neck exercises.  She is currently not taking any muscle relaxer.  Have given her prescription for Robaxin 500 mg p.o. twice daily as needed.  Side effects were reviewed.  Primary insomnia: Good sleep hygiene was discussed.  Other fatigue: Related to insomnia.  Osteopenia of multiple sites: Use of calcium, vitamin D and resistive exercise was discussed.  History of bilateral carpal tunnel release: Doing better.  History of gastroesophageal reflux (GERD)  Raised intraocular pressure, unspecified laterality  History of hyperlipidemia  Essential hypertension  Type 2 diabetes mellitus without complication, without long-term current use of insulin ([place])    Orders: No orders of the defined types were placed in this encounter.  Meds ordered this encounter  Medications  . methocarbamol (ROBAXIN) 500 MG tablet    Sig: Take 1 tablet (500 mg total) by mouth 2 (two) times daily.    Dispense:  60 tablet    Refill:  2    Face-to-face time spent with patient was 25 minutes. Greater than 50% of time was spent in counseling and coordination of care.  Follow-Up Instructions: Return in about 6 months (around 01/06/2018) for FMS.   Bo Merino, MD  Note - This record has been created using Editor, commissioning.  Chart creation errors have been sought, but may not always  have been located. Such creation errors do not reflect on  the standard of medical care.

## 2017-07-06 ENCOUNTER — Ambulatory Visit: Payer: 59 | Admitting: Rheumatology

## 2017-07-06 ENCOUNTER — Encounter: Payer: Self-pay | Admitting: Physician Assistant

## 2017-07-06 VITALS — BP 159/101 | HR 68 | Resp 14 | Ht 60.0 in | Wt 176.0 lb

## 2017-07-06 DIAGNOSIS — Z9889 Other specified postprocedural states: Secondary | ICD-10-CM | POA: Diagnosis not present

## 2017-07-06 DIAGNOSIS — Z8639 Personal history of other endocrine, nutritional and metabolic disease: Secondary | ICD-10-CM | POA: Diagnosis not present

## 2017-07-06 DIAGNOSIS — I10 Essential (primary) hypertension: Secondary | ICD-10-CM | POA: Diagnosis not present

## 2017-07-06 DIAGNOSIS — F5101 Primary insomnia: Secondary | ICD-10-CM | POA: Diagnosis not present

## 2017-07-06 DIAGNOSIS — M8589 Other specified disorders of bone density and structure, multiple sites: Secondary | ICD-10-CM

## 2017-07-06 DIAGNOSIS — R5383 Other fatigue: Secondary | ICD-10-CM

## 2017-07-06 DIAGNOSIS — Z8719 Personal history of other diseases of the digestive system: Secondary | ICD-10-CM

## 2017-07-06 DIAGNOSIS — H40059 Ocular hypertension, unspecified eye: Secondary | ICD-10-CM

## 2017-07-06 DIAGNOSIS — E119 Type 2 diabetes mellitus without complications: Secondary | ICD-10-CM

## 2017-07-06 DIAGNOSIS — B078 Other viral warts: Secondary | ICD-10-CM | POA: Diagnosis not present

## 2017-07-06 DIAGNOSIS — M797 Fibromyalgia: Secondary | ICD-10-CM

## 2017-07-06 MED ORDER — METHOCARBAMOL 500 MG PO TABS: 500.0000 mg | ORAL_TABLET | Freq: Two times a day (BID) | ORAL | 2 refills | Status: DC

## 2017-07-06 NOTE — Patient Instructions (Signed)
Cervical Strain and Sprain Rehab Ask your health care provider which exercises are safe for you. Do exercises exactly as told by your health care provider and adjust them as directed. It is normal to feel mild stretching, pulling, tightness, or discomfort as you do these exercises, but you should stop right away if you feel sudden pain or your pain gets worse.Do not begin these exercises until told by your health care provider. Stretching and range of motion exercises These exercises warm up your muscles and joints and improve the movement and flexibility of your neck. These exercises also help to relieve pain, numbness, and tingling. Exercise A: Cervical side bend  1. Using good posture, sit on a stable chair or stand up. 2. Without moving your shoulders, slowly tilt your left / right ear to your shoulder until you feel a stretch in your neck muscles. You should be looking straight ahead. 3. Hold for __________ seconds. 4. Repeat with the other side of your neck. Repeat __________ times. Complete this exercise __________ times a day. Exercise B: Cervical rotation  1. Using good posture, sit on a stable chair or stand up. 2. Slowly turn your head to the side as if you are looking over your left / right shoulder. ? Keep your eyes level with the ground. ? Stop when you feel a stretch along the side and the back of your neck. 3. Hold for __________ seconds. 4. Repeat this by turning to your other side. Repeat __________ times. Complete this exercise __________ times a day. Exercise C: Thoracic extension and pectoral stretch 1. Roll a towel or a small blanket so it is about 4 inches (10 cm) in diameter. 2. Lie down on your back on a firm surface. 3. Put the towel lengthwise, under your spine in the middle of your back. It should not be not under your shoulder blades. The towel should line up with your spine from your middle back to your lower back. 4. Put your hands behind your head and let your  elbows fall out to your sides. 5. Hold for __________ seconds. Repeat __________ times. Complete this exercise __________ times a day. Strengthening exercises These exercises build strength and endurance in your neck. Endurance is the ability to use your muscles for a long time, even after your muscles get tired. Exercise D: Upper cervical flexion, isometric 1. Lie on your back with a thin pillow behind your head and a small rolled-up towel under your neck. 2. Gently tuck your chin toward your chest and nod your head down to look toward your feet. Do not lift your head off the pillow. 3. Hold for __________ seconds. 4. Release the tension slowly. Relax your neck muscles completely before you repeat this exercise. Repeat __________ times. Complete this exercise __________ times a day. Exercise E: Cervical extension, isometric  1. Stand about 6 inches (15 cm) away from a wall, with your back facing the wall. 2. Place a soft object, about 6-8 inches (15-20 cm) in diameter, between the back of your head and the wall. A soft object could be a small pillow, a ball, or a folded towel. 3. Gently tilt your head back and press into the soft object. Keep your jaw and forehead relaxed. 4. Hold for __________ seconds. 5. Release the tension slowly. Relax your neck muscles completely before you repeat this exercise. Repeat __________ times. Complete this exercise __________ times a day. Posture and body mechanics  Body mechanics refers to the movements and positions of   your body while you do your daily activities. Posture is part of body mechanics. Good posture and healthy body mechanics can help to relieve stress in your body's tissues and joints. Good posture means that your spine is in its natural S-curve position (your spine is neutral), your shoulders are pulled back slightly, and your head is not tipped forward. The following are general guidelines for applying improved posture and body mechanics to  your everyday activities. Standing  When standing, keep your spine neutral and keep your feet about hip-width apart. Keep a slight bend in your knees. Your ears, shoulders, and hips should line up.  When you do a task in which you stand in one place for a long time, place one foot up on a stable object that is 2-4 inches (5-10 cm) high, such as a footstool. This helps keep your spine neutral. Sitting   When sitting, keep your spine neutral and your keep feet flat on the floor. Use a footrest, if necessary, and keep your thighs parallel to the floor. Avoid rounding your shoulders, and avoid tilting your head forward.  When working at a desk or a computer, keep your desk at a height where your hands are slightly lower than your elbows. Slide your chair under your desk so you are close enough to maintain good posture.  When working at a computer, place your monitor at a height where you are looking straight ahead and you do not have to tilt your head forward or downward to look at the screen. Resting When lying down and resting, avoid positions that are most painful for you. Try to support your neck in a neutral position. You can use a contour pillow or a small rolled-up towel. Your pillow should support your neck but not push on it. This information is not intended to replace advice given to you by your health care provider. Make sure you discuss any questions you have with your health care provider. Document Released: 04/06/2005 Document Revised: 12/12/2015 Document Reviewed: 03/13/2015 Elsevier Interactive Patient Education  2018 Elsevier Inc.  

## 2017-07-19 ENCOUNTER — Other Ambulatory Visit: Payer: Self-pay | Admitting: Internal Medicine

## 2017-08-17 ENCOUNTER — Other Ambulatory Visit: Payer: Self-pay | Admitting: Internal Medicine

## 2017-08-17 ENCOUNTER — Ambulatory Visit (INDEPENDENT_AMBULATORY_CARE_PROVIDER_SITE_OTHER): Payer: 59 | Admitting: Internal Medicine

## 2017-08-17 ENCOUNTER — Encounter: Payer: Self-pay | Admitting: Internal Medicine

## 2017-08-17 VITALS — BP 140/92 | HR 68 | Temp 98.4°F | Ht 61.5 in | Wt 177.0 lb

## 2017-08-17 DIAGNOSIS — E119 Type 2 diabetes mellitus without complications: Secondary | ICD-10-CM

## 2017-08-17 DIAGNOSIS — J4520 Mild intermittent asthma, uncomplicated: Secondary | ICD-10-CM | POA: Diagnosis not present

## 2017-08-17 DIAGNOSIS — E782 Mixed hyperlipidemia: Secondary | ICD-10-CM | POA: Diagnosis not present

## 2017-08-17 DIAGNOSIS — M1990 Unspecified osteoarthritis, unspecified site: Secondary | ICD-10-CM

## 2017-08-17 DIAGNOSIS — K219 Gastro-esophageal reflux disease without esophagitis: Secondary | ICD-10-CM

## 2017-08-17 DIAGNOSIS — Z0000 Encounter for general adult medical examination without abnormal findings: Secondary | ICD-10-CM | POA: Diagnosis not present

## 2017-08-17 DIAGNOSIS — I10 Essential (primary) hypertension: Secondary | ICD-10-CM | POA: Diagnosis not present

## 2017-08-17 DIAGNOSIS — M797 Fibromyalgia: Secondary | ICD-10-CM | POA: Diagnosis not present

## 2017-08-17 MED ORDER — LISINOPRIL 10 MG PO TABS: 10.0000 mg | ORAL_TABLET | Freq: Every day | ORAL | 0 refills | Status: DC

## 2017-08-17 NOTE — Assessment & Plan Note (Signed)
Advised her to try Tylenol Arthritis prn

## 2017-08-17 NOTE — Assessment & Plan Note (Signed)
Elevated today Will add Lisinopril 10 mg daily, eRx sent to pharmacy Reinforced DASH diet and exercise for weight loss CBC and CMET today

## 2017-08-17 NOTE — Assessment & Plan Note (Signed)
Encouraged routine physical exercise Continue Lidoderm, Robaxin and Gabapentin She will continue to follow with rheumatology

## 2017-08-17 NOTE — Assessment & Plan Note (Signed)
CMET and lipid profile today Encouraged her to consume a low fat diet Will discuss statin therapy if LDL > 100

## 2017-08-17 NOTE — Addendum Note (Signed)
Addended by: Jearld Fenton on: 08/17/2017 09:23 AM   Modules accepted: Orders

## 2017-08-17 NOTE — Progress Notes (Signed)
Subjective:    Patient ID: Courtney Guzman, female    DOB: [birth year], 57 y.o.   MRN: [id number]  HPI  Pt presents to the clinic today for her annual exam. She is also due to followup chronic conditions.  Arthritis: Mainly in her hands. She does not take anything routinely for this.  DM 2: Her last A1C was 6%, 07/2016. This is diet controlled. She does not check her sugars. Her last eye exam was 03/2017.   Fibromyalgia: Chronic. She is taking Lidocaine Patches, Robaxin and Gabapentin as prescribed. She follows with Dr. August Luz.  GERD: She denies breakthrough symptoms on Omeprazole. Her last EGD was in 2006.  HTN: Her BP today is 140/92  . She is taking Amlodipine as prescribed. She reports she is not stress but just found out that her father in law has cancer. ECG from 07/2008 reviewed.  HLD: Her last LDL was 127, 07/2016. She is not taking any cholesterol lowering at this time. She tries to consume a low fat.  Flu: 01/2017 Tetanus: 02/2016 Pneumovax: never Pap Smear: 04/2014 Mammogram: 05/2017 Bone Density: 07/2014 Colon Screening: 07/2015 Vision Screening: annually Dentist: annually  Diet: She does eat meat. She consumes more veggies than fruits. She tries to avoid fried foods. She drinks mostly water, diet soda. Exercise: [employer], 3-4 days per week  Review of Systems      Past Medical History:  Diagnosis Date  . Allergy    seasonal  . Arthritis   . Diabetes mellitus without complication ([place])   . Fibromyalgia    dr. Marveen Reeks  . GERD (gastroesophageal reflux disease)   . Hyperlipidemia   . Hypertension   . Wheezes    with season changes     Current Outpatient Medications  Medication Sig Dispense Refill  . amLODipine (NORVASC) 10 MG tablet Take 1 tablet (10 mg total) by mouth daily. 90 tablet 1  . amLODipine (NORVASC) 10 MG tablet TAKE 1 TABLET BY MOUTH EVERY DAY 30 tablet 0  . calcium carbonate (OS-CAL) 600 MG TABS Take 600 mg by mouth 2 (two) times daily with a  meal.      . desonide (DESOWEN) 0.05 % ointment     . ferrous sulfate 325 (65 FE) MG tablet Take 325 mg by mouth daily with breakfast.    . fexofenadine (ALLEGRA) 30 MG tablet Take 30 mg by mouth 2 (two) times daily.    . fish oil-omega-3 fatty acids 1000 MG capsule Take 2 g by mouth daily.    Marland Kitchen gabapentin (NEURONTIN) 100 MG capsule TAKE 2 CAPSULES BY MOUTH 3 TIMES A DAY 540 capsule 0  . Green Tea, Camillia sinensis, (GREEN TEA PO) Take by mouth.      . latanoprost (XALATAN) 0.005 % ophthalmic solution     . lidocaine (LIDODERM) 5 % Place 1 patch onto the skin daily. Remove & Discard patch within 12 hours or as directed by MD (Patient taking differently: Place 1 patch onto the skin as needed. Remove & Discard patch within 12 hours or as directed by MD) 30 patch 2  . methocarbamol (ROBAXIN) 500 MG tablet Take 1 tablet (500 mg total) by mouth 2 (two) times daily. 60 tablet 2  . Multiple Vitamin (MULTIVITAMIN) tablet Take 1 tablet by mouth daily.    Marland Kitchen omeprazole (PRILOSEC) 40 MG capsule TAKE 1 CAPSULE (40 MG TOTAL) BY MOUTH DAILY. 30 capsule 10  . OVER THE COUNTER MEDICATION Cholester Reg II    . timolol (TIMOPTIC) 0.5 %  ophthalmic solution     . VENTOLIN HFA 108 (90 Base) MCG/ACT inhaler INHALE 2 PUFFS INTO THE LUNGS EVERY 4 (FOUR) HOURS AS NEEDED FOR WHEEZING. 18 Inhaler 3   No current facility-administered medications for this visit.     No Known Allergies  Family History  Problem Relation Age of Onset  . Hypertension Mother   . Diabetes Sister   . Cancer Maternal Grandmother        OVARIAN  . Ovarian cancer Maternal Grandmother   . Colon cancer Neg Hx   . Colon polyps Neg Hx     Social History   Socioeconomic History  . Marital status: Married    Spouse name: Not on file  . Number of children: Not on file  . Years of education: Not on file  . Highest education level: Not on file  Occupational History  . Not on file  Social Needs  . Financial resource strain: Not on file    . Food insecurity:    Worry: Not on file    Inability: Not on file  . Transportation needs:    Medical: Not on file    Non-medical: Not on file  Tobacco Use  . Smoking status: Never Smoker  . Smokeless tobacco: Never Used  Substance and Sexual Activity  . Alcohol use: No    Alcohol/week: 0.0 oz  . Drug use: No  . Sexual activity: Yes    Partners: Male    Birth control/protection: Surgical    Comment: TUBAL LIGATION, 1st sexual encounter at 11, has had 5   Lifestyle  . Physical activity:    Days per week: Not on file    Minutes per session: Not on file  . Stress: Not on file  Relationships  . Social connections:    Talks on phone: Not on file    Gets together: Not on file    Attends religious service: Not on file    Active member of club or organization: Not on file    Attends meetings of clubs or organizations: Not on file    Relationship status: Not on file  . Intimate partner violence:    Fear of current or ex partner: Not on file    Emotionally abused: Not on file    Physically abused: Not on file    Forced sexual activity: Not on file  Other Topics Concern  . Not on file  Social History Narrative  . Not on file     Constitutional: Pt reports intermittent headaches. Denies fever, malaise, fatigue, or abrupt weight changes.  HEENT: Denies eye pain, eye redness, ear pain, ringing in the ears, wax buildup, runny nose, nasal congestion, bloody nose, or sore throat. Respiratory: Denies difficulty breathing, shortness of breath, cough or sputum production.   Cardiovascular: Denies chest pain, chest tightness, palpitations or swelling in the hands or feet.  Gastrointestinal: Denies abdominal pain, bloating, constipation, diarrhea or blood in the stool.  GU: Denies urgency, frequency, pain with urination, burning sensation, blood in urine, odor or discharge. Musculoskeletal: Pt reports chronic muscle pain. Denies decrease in range of motion, difficulty with gait, or joint  pain and swelling.  Skin: Denies redness, rashes, lesions or ulcercations.  Neurological: Denies dizziness, difficulty with memory, difficulty with speech or problems with balance and coordination.  Psych: Denies anxiety, depression, SI/HI.  No other specific complaints in a complete review of systems (except as listed in HPI above).  Objective:   Physical Exam   BP (!) 140/92  Pulse 68   Temp 98.4 F (36.9 C) (Oral)   Ht 5' 1.5" (1.562 m)   Wt 177 lb (80.3 kg)   LMP 02/24/2008   SpO2 98%   BMI 32.90 kg/m  Wt Readings from Last 3 Encounters:  08/17/17 177 lb (80.3 kg)  07/06/17 176 lb (79.8 kg)  01/29/17 167 lb 8 oz (76 kg)    General: Appears her stated age, obese in NAD. Skin: Warm, dry and intact. No ulcerations noted. HEENT: Head: normal shape and size; Eyes: sclera white, no icterus, conjunctiva pink, PERRLA and EOMs intact; Ears: Tm's gray and intact, normal light reflex; Throat/Mouth: Teeth present, mucosa pink and moist, no exudate, lesions or ulcerations noted.  Neck:  Neck supple, trachea midline. No masses, lumps or thyromegaly present.  Cardiovascular: Normal rate and rhythm. S1,S2 noted.  No murmur, rubs or gallops noted. No JVD or BLE edema. No carotid bruits noted. Pulmonary/Chest: Normal effort and positive vesicular breath sounds. No respiratory distress. No wheezes, rales or ronchi noted.  Abdomen: Soft and nontender. Normal bowel sounds. No distention or masses noted. Liver, spleen and kidneys non palpable. Musculoskeletal: Strength 5/5 BUE/BLE. No difficulty with gait.  Neurological: Alert and oriented. Cranial nerves II-XII grossly intact. Coordination normal.  Psychiatric: Mood and affect normal. Behavior is normal. Judgment and thought content normal.   BMET    Component Value Date/Time   NA 144 01/29/2017 0840   NA 140 08/11/2016 0833   K 5.4 (H) 01/29/2017 0840   CL 109 01/29/2017 0840   CO2 29 01/29/2017 0840   GLUCOSE 109 (H) 01/29/2017 0840     BUN 12 01/29/2017 0840   BUN 10 08/11/2016 0833   CREATININE 0.69 01/29/2017 0840   CREATININE 0.66 04/24/2014 0859   CALCIUM 9.5 01/29/2017 0840   GFRNONAA 89 08/11/2016 0833   GFRAA 103 08/11/2016 0833    Lipid Panel     Component Value Date/Time   CHOL 223 (H) 08/11/2016 0833   TRIG 64 08/11/2016 0833   HDL 83 08/11/2016 0833   CHOLHDL 2.7 08/11/2016 0833   CHOLHDL 3 03/03/2016 0912   VLDL 14.2 03/03/2016 0912   LDLCALC 127 (H) 08/11/2016 0833    CBC    Component Value Date/Time   WBC 4.9 01/29/2017 0840   RBC 4.56 01/29/2017 0840   HGB 12.9 01/29/2017 0840   HGB 12.6 08/11/2016 0833   HCT 40.2 01/29/2017 0840   HCT 38.5 08/11/2016 0833   PLT 322.0 01/29/2017 0840   PLT 376 08/11/2016 0833   MCV 88.1 01/29/2017 0840   MCV 87 08/11/2016 0833   MCH 28.3 08/11/2016 0833   MCH 27.8 04/24/2014 0859   MCHC 32.1 01/29/2017 0840   RDW 14.1 01/29/2017 0840   RDW 14.5 08/11/2016 0833   LYMPHSABS 3.5 (H) 07/02/2016 0943   MONOABS 0.5 04/24/2014 0859   EOSABS 0.1 07/02/2016 0943   BASOSABS 0.0 07/02/2016 0943    Hgb A1C Lab Results  Component Value Date   HGBA1C 6.0 (H) 08/11/2016           Assessment & Plan:   Preventative Health Maintenance:  Encouraged her to get a flu shot in the fall Tetanus UTD She declines pneumovax today Pap smear and mammogram UTD Colon screening abdominal pain Encouraged her to consume a balanced diet and exercise regimen Advised him to see an eye doctor and dentist annually Will check CBC, CMET, Lipid, A1C, microalbumin and Vit D today.  RTC in 3 weeks for HTN followup  Webb Silversmith, NP

## 2017-08-17 NOTE — Assessment & Plan Note (Signed)
A1C and microalbumin today Encouraged her to consume a low carb diet Eye exam abstracted Foot exam today Flu shot UTD She declines pneumovax

## 2017-08-17 NOTE — Patient Instructions (Signed)
Health Maintenance for Postmenopausal Women Menopause is a normal process in which your reproductive ability comes to an end. This process happens gradually over a span of months to years, usually between the ages of 22 and 9. Menopause is complete when you have missed 12 consecutive menstrual periods. It is important to talk with your health care provider about some of the most common conditions that affect postmenopausal women, such as heart disease, cancer, and bone loss (osteoporosis). Adopting a healthy lifestyle and getting preventive care can help to promote your health and wellness. Those actions can also lower your chances of developing some of these common conditions. What should I know about menopause? During menopause, you may experience a number of symptoms, such as:  Moderate-to-severe hot flashes.  Night sweats.  Decrease in sex drive.  Mood swings.  Headaches.  Tiredness.  Irritability.  Memory problems.  Insomnia.  Choosing to treat or not to treat menopausal changes is an individual decision that you make with your health care provider. What should I know about hormone replacement therapy and supplements? Hormone therapy products are effective for treating symptoms that are associated with menopause, such as hot flashes and night sweats. Hormone replacement carries certain risks, especially as you become older. If you are thinking about using estrogen or estrogen with progestin treatments, discuss the benefits and risks with your health care provider. What should I know about heart disease and stroke? Heart disease, heart attack, and stroke become more likely as you age. This may be due, in part, to the hormonal changes that your body experiences during menopause. These can affect how your body processes dietary fats, triglycerides, and cholesterol. Heart attack and stroke are both medical emergencies. There are many things that you can do to help prevent heart disease  and stroke:  Have your blood pressure checked at least every 1-2 years. High blood pressure causes heart disease and increases the risk of stroke.  If you are 53-22 years old, ask your health care provider if you should take aspirin to prevent a heart attack or a stroke.  Do not use any tobacco products, including cigarettes, chewing tobacco, or electronic cigarettes. If you need help quitting, ask your health care provider.  It is important to eat a healthy diet and maintain a healthy weight. ? Be sure to include plenty of vegetables, fruits, low-fat dairy products, and lean protein. ? Avoid eating foods that are high in solid fats, added sugars, or salt (sodium).  Get regular exercise. This is one of the most important things that you can do for your health. ? Try to exercise for at least 150 minutes each week. The type of exercise that you do should increase your heart rate and make you sweat. This is known as moderate-intensity exercise. ? Try to do strengthening exercises at least twice each week. Do these in addition to the moderate-intensity exercise.  Know your numbers.Ask your health care provider to check your cholesterol and your blood glucose. Continue to have your blood tested as directed by your health care provider.  What should I know about cancer screening? There are several types of cancer. Take the following steps to reduce your risk and to catch any cancer development as early as possible. Breast Cancer  Practice breast self-awareness. ? This means understanding how your breasts normally appear and feel. ? It also means doing regular breast self-exams. Let your health care provider know about any changes, no matter how small.  If you are 40  or older, have a clinician do a breast exam (clinical breast exam or CBE) every year. Depending on your age, family history, and medical history, it may be recommended that you also have a yearly breast X-ray (mammogram).  If you  have a family history of breast cancer, talk with your health care provider about genetic screening.  If you are at high risk for breast cancer, talk with your health care provider about having an MRI and a mammogram every year.  Breast cancer (BRCA) gene test is recommended for women who have family members with BRCA-related cancers. Results of the assessment will determine the need for genetic counseling and BRCA1 and for BRCA2 testing. BRCA-related cancers include these types: ? Breast. This occurs in males or females. ? Ovarian. ? Tubal. This may also be called fallopian tube cancer. ? Cancer of the abdominal or pelvic lining (peritoneal cancer). ? Prostate. ? Pancreatic.  Cervical, Uterine, and Ovarian Cancer Your health care provider may recommend that you be screened regularly for cancer of the pelvic organs. These include your ovaries, uterus, and vagina. This screening involves a pelvic exam, which includes checking for microscopic changes to the surface of your cervix (Pap test).  For women ages 21-65, health care providers may recommend a pelvic exam and a Pap test every three years. For women ages 79-65, they may recommend the Pap test and pelvic exam, combined with testing for human papilloma virus (HPV), every five years. Some types of HPV increase your risk of cervical cancer. Testing for HPV may also be done on women of any age who have unclear Pap test results.  Other health care providers may not recommend any screening for nonpregnant women who are considered low risk for pelvic cancer and have no symptoms. Ask your health care provider if a screening pelvic exam is right for you.  If you have had past treatment for cervical cancer or a condition that could lead to cancer, you need Pap tests and screening for cancer for at least 20 years after your treatment. If Pap tests have been discontinued for you, your risk factors (such as having a new sexual partner) need to be  reassessed to determine if you should start having screenings again. Some women have medical problems that increase the chance of getting cervical cancer. In these cases, your health care provider may recommend that you have screening and Pap tests more often.  If you have a family history of uterine cancer or ovarian cancer, talk with your health care provider about genetic screening.  If you have vaginal bleeding after reaching menopause, tell your health care provider.  There are currently no reliable tests available to screen for ovarian cancer.  Lung Cancer Lung cancer screening is recommended for adults 69-62 years old who are at high risk for lung cancer because of a history of smoking. A yearly low-dose CT scan of the lungs is recommended if you:  Currently smoke.  Have a history of at least 30 pack-years of smoking and you currently smoke or have quit within the past 15 years. A pack-year is smoking an average of one pack of cigarettes per day for one year.  Yearly screening should:  Continue until it has been 15 years since you quit.  Stop if you develop a health problem that would prevent you from having lung cancer treatment.  Colorectal Cancer  This type of cancer can be detected and can often be prevented.  Routine colorectal cancer screening usually begins at  age 42 and continues through age 45.  If you have risk factors for colon cancer, your health care provider may recommend that you be screened at an earlier age.  If you have a family history of colorectal cancer, talk with your health care provider about genetic screening.  Your health care provider may also recommend using home test kits to check for hidden blood in your stool.  A small camera at the end of a tube can be used to examine your colon directly (sigmoidoscopy or colonoscopy). This is done to check for the earliest forms of colorectal cancer.  Direct examination of the colon should be repeated every  5-10 years until age 71. However, if early forms of precancerous polyps or small growths are found or if you have a family history or genetic risk for colorectal cancer, you may need to be screened more often.  Skin Cancer  Check your skin from head to toe regularly.  Monitor any moles. Be sure to tell your health care provider: ? About any new moles or changes in moles, especially if there is a change in a mole's shape or color. ? If you have a mole that is larger than the size of a pencil eraser.  If any of your family members has a history of skin cancer, especially at a young age, talk with your health care provider about genetic screening.  Always use sunscreen. Apply sunscreen liberally and repeatedly throughout the day.  Whenever you are outside, protect yourself by wearing long sleeves, pants, a wide-brimmed hat, and sunglasses.  What should I know about osteoporosis? Osteoporosis is a condition in which bone destruction happens more quickly than new bone creation. After menopause, you may be at an increased risk for osteoporosis. To help prevent osteoporosis or the bone fractures that can happen because of osteoporosis, the following is recommended:  If you are 46-71 years old, get at least 1,000 mg of calcium and at least 600 mg of vitamin D per day.  If you are older than age 55 but younger than age 65, get at least 1,200 mg of calcium and at least 600 mg of vitamin D per day.  If you are older than age 54, get at least 1,200 mg of calcium and at least 800 mg of vitamin D per day.  Smoking and excessive alcohol intake increase the risk of osteoporosis. Eat foods that are rich in calcium and vitamin D, and do weight-bearing exercises several times each week as directed by your health care provider. What should I know about how menopause affects my mental health? Depression may occur at any age, but it is more common as you become older. Common symptoms of depression  include:  Low or sad mood.  Changes in sleep patterns.  Changes in appetite or eating patterns.  Feeling an overall lack of motivation or enjoyment of activities that you previously enjoyed.  Frequent crying spells.  Talk with your health care provider if you think that you are experiencing depression. What should I know about immunizations? It is important that you get and maintain your immunizations. These include:  Tetanus, diphtheria, and pertussis (Tdap) booster vaccine.  Influenza every year before the flu season begins.  Pneumonia vaccine.  Shingles vaccine.  Your health care provider may also recommend other immunizations. This information is not intended to replace advice given to you by your health care provider. Make sure you discuss any questions you have with your health care provider. Document Released: 05/29/2005  Document Revised: 10/25/2015 Document Reviewed: 01/08/2015 Elsevier Interactive Patient Education  2018 [employer]  

## 2017-08-17 NOTE — Assessment & Plan Note (Signed)
Controlled on Omeprazole CBC and CMET today Dicussed how weight loss could help improve reflux

## 2017-08-18 ENCOUNTER — Telehealth: Payer: Self-pay

## 2017-08-18 NOTE — Telephone Encounter (Signed)
Unable to reach pt by phone.

## 2017-08-18 NOTE — Telephone Encounter (Signed)
I spoke with Terri in lab and lab drawn on 08/17/17 did go to [facility]; pt thought R Baity NP was giving her med that was 25 mg. Avie Echevaria NP said initially was going to prescribe losartan 25 mg but due to recall of losartan changed the med to lisinopril 10 mg taking one daily. Pt voiced understanding and nothing further needed.

## 2017-08-18 NOTE — Telephone Encounter (Signed)
Copied from [place] [id number]. Topic: Quick Communication - See Telephone Encounter >> Aug 18, 2017  7:21 AM Marja Kays F wrote: Pt is needing to confirm her lisinopril miligrams that was called in for her and she is returning a call from the office stating that she needs to send labs to [facility]  Best number [phone] ok to leave a message

## 2017-08-19 LAB — COMPREHENSIVE METABOLIC PANEL
ALT: 13 IU/L (ref 0–32)
AST: 18 IU/L (ref 0–40)
Albumin/Globulin Ratio: 1.5 (ref 1.2–2.2)
Albumin: 4.5 g/dL (ref 3.5–5.5)
Alkaline Phosphatase: 92 IU/L (ref 39–117)
BUN/Creatinine Ratio: 12 (ref 9–23)
BUN: 8 mg/dL (ref 6–24)
Bilirubin Total: 0.2 mg/dL (ref 0.0–1.2)
CO2: 21 mmol/L (ref 20–29)
Calcium: 9.7 mg/dL (ref 8.7–10.2)
Chloride: 104 mmol/L (ref 96–106)
Creatinine, Ser: 0.68 mg/dL (ref 0.57–1.00)
GFR calc Af Amer: 112 mL/min/{1.73_m2} (ref >59)
GFR calc non Af Amer: 97 mL/min/{1.73_m2} (ref >59)
Globulin, Total: 3 g/dL (ref 1.5–4.5)
Glucose: 114 mg/dL — ABNORMAL HIGH (ref 65–99)
Potassium: 4.3 mmol/L (ref 3.5–5.2)
Sodium: 140 mmol/L (ref 134–144)
Total Protein: 7.5 g/dL (ref 6.0–8.5)

## 2017-08-19 LAB — MICROALBUMIN / CREATININE URINE RATIO
Creatinine, Urine: 6.5 mg/dL
Microalbumin, Urine: <3 ug/mL

## 2017-08-19 LAB — CBC
Hematocrit: 39.1 % (ref 34.0–46.6)
Hemoglobin: 12.6 g/dL (ref 11.1–15.9)
MCH: 27.9 pg (ref 26.6–33.0)
MCHC: 32.2 g/dL (ref 31.5–35.7)
MCV: 87 fL (ref 79–97)
Platelets: 344 10*3/uL (ref 150–379)
RBC: 4.52 x10E6/uL (ref 3.77–5.28)
RDW: 14.2 % (ref 12.3–15.4)
WBC: 5.6 10*3/uL (ref 3.4–10.8)

## 2017-08-19 LAB — HEMOGLOBIN A1C
Est. average glucose Bld gHb Est-mCnc: 134 mg/dL
Hgb A1c MFr Bld: 6.3 % — ABNORMAL HIGH (ref 4.8–5.6)

## 2017-08-19 LAB — LIPID PANEL
Chol/HDL Ratio: 3.1 ratio (ref 0.0–4.4)
Cholesterol, Total: 219 mg/dL — ABNORMAL HIGH (ref 100–199)
HDL: 70 mg/dL (ref >39)
LDL Calculated: 131 mg/dL — ABNORMAL HIGH (ref 0–99)
Triglycerides: 90 mg/dL (ref 0–149)
VLDL Cholesterol Cal: 18 mg/dL (ref 5–40)

## 2017-08-19 LAB — VITAMIN D 25 HYDROXY (VIT D DEFICIENCY, FRACTURES): Vit D, 25-Hydroxy: 42.7 ng/mL (ref 30.0–100.0)

## 2017-08-19 MED ORDER — ATORVASTATIN CALCIUM 10 MG PO TABS: 10.0000 mg | ORAL_TABLET | Freq: Every day | ORAL | 2 refills | Status: DC

## 2017-08-19 MED ORDER — ALBUTEROL SULFATE HFA 108 (90 BASE) MCG/ACT IN AERS: INHALATION_SPRAY | RESPIRATORY_TRACT | 3 refills | Status: DC

## 2017-08-19 NOTE — Addendum Note (Signed)
Addended by: Lurlean Nanny on: 08/19/2017 04:02 PM   Modules accepted: Orders

## 2017-08-31 ENCOUNTER — Ambulatory Visit: Payer: 59 | Admitting: Internal Medicine

## 2017-09-07 ENCOUNTER — Ambulatory Visit: Payer: 59 | Admitting: Internal Medicine

## 2017-09-07 ENCOUNTER — Encounter: Payer: Self-pay | Admitting: Internal Medicine

## 2017-09-07 VITALS — BP 132/88 | HR 62 | Temp 97.9°F | Wt 173.0 lb

## 2017-09-07 DIAGNOSIS — E7800 Pure hypercholesterolemia, unspecified: Secondary | ICD-10-CM | POA: Diagnosis not present

## 2017-09-07 DIAGNOSIS — I10 Essential (primary) hypertension: Secondary | ICD-10-CM

## 2017-09-07 MED ORDER — LISINOPRIL 20 MG PO TABS: 20.0000 mg | ORAL_TABLET | Freq: Every day | ORAL | 0 refills | Status: DC

## 2017-09-07 NOTE — Progress Notes (Signed)
Subjective:    Patient ID: Courtney Guzman, female    DOB: 1960/09/04, 57 y.o.   MRN: [id number]  HPI  Pt presents to the clinic today for 3 week follow up of HTN. At her last visit, she was started on Lisinopril 10 mg daily which she is taking at night. She is also taking Amlodipine 10 mg in the morning which she has not taken yet today. She denies adverse side effects. Her BP today is 132/88. She reports her blood pressure at home has been running about the same. ECG from 07/2008 reviewed.  She also wants to make sure she has labs ordered for her lab only appt in 9 weeks to recheck cholesterol. She was recently started on Atorvastatin. She denies myalgias.  Review of Systems  Past Medical History:  Diagnosis Date  . Allergy    seasonal  . Arthritis   . Diabetes mellitus without complication ([place])   . Fibromyalgia    dr. Marveen Reeks  . GERD (gastroesophageal reflux disease)   . Hyperlipidemia   . Hypertension   . Wheezes    with season changes     Current Outpatient Medications  Medication Sig Dispense Refill  . albuterol (VENTOLIN HFA) 108 (90 Base) MCG/ACT inhaler INHALE 2 PUFFS INTO THE LUNGS EVERY 4 (FOUR) HOURS AS NEEDED FOR WHEEZING. 18 Inhaler 3  . amLODipine (NORVASC) 10 MG tablet Take 1 tablet (10 mg total) by mouth daily. 90 tablet 1  . atorvastatin (LIPITOR) 10 MG tablet Take 1 tablet (10 mg total) by mouth daily. 30 tablet 2  . calcium carbonate (OS-CAL) 600 MG TABS Take 600 mg by mouth 2 (two) times daily with a meal.      . desonide (DESOWEN) 0.05 % ointment     . ferrous sulfate 325 (65 FE) MG tablet Take 325 mg by mouth daily with breakfast.    . fexofenadine (ALLEGRA) 30 MG tablet Take 30 mg by mouth 2 (two) times daily.    . fish oil-omega-3 fatty acids 1000 MG capsule Take 2 g by mouth daily.    Marland Kitchen gabapentin (NEURONTIN) 100 MG capsule TAKE 2 CAPSULES BY MOUTH 3 TIMES A DAY 540 capsule 0  . Green Tea, Camillia sinensis, (GREEN TEA PO) Take by mouth.      .  latanoprost (XALATAN) 0.005 % ophthalmic solution     . lidocaine (LIDODERM) 5 % Place 1 patch onto the skin daily. Remove & Discard patch within 12 hours or as directed by MD (Patient taking differently: Place 1 patch onto the skin as needed. Remove & Discard patch within 12 hours or as directed by MD) 30 patch 2  . lisinopril (PRINIVIL,ZESTRIL) 10 MG tablet Take 1 tablet (10 mg total) by mouth daily. 30 tablet 0  . methocarbamol (ROBAXIN) 500 MG tablet Take 1 tablet (500 mg total) by mouth 2 (two) times daily. 60 tablet 2  . Multiple Vitamin (MULTIVITAMIN) tablet Take 1 tablet by mouth daily.    Marland Kitchen omeprazole (PRILOSEC) 40 MG capsule TAKE 1 CAPSULE (40 MG TOTAL) BY MOUTH DAILY. 30 capsule 10  . OVER THE COUNTER MEDICATION Cholester Reg II    . timolol (TIMOPTIC) 0.5 % ophthalmic solution      No current facility-administered medications for this visit.     No Known Allergies  Family History  Problem Relation Age of Onset  . Hypertension Mother   . Diabetes Sister   . Cancer Maternal Grandmother        OVARIAN  .  Ovarian cancer Maternal Grandmother   . Colon cancer Neg Hx   . Colon polyps Neg Hx     Social History   Socioeconomic History  . Marital status: Married    Spouse name: Not on file  . Number of children: Not on file  . Years of education: Not on file  . Highest education level: Not on file  Occupational History  . Not on file  Social Needs  . Financial resource strain: Not on file  . Food insecurity:    Worry: Not on file    Inability: Not on file  . Transportation needs:    Medical: Not on file    Non-medical: Not on file  Tobacco Use  . Smoking status: Never Smoker  . Smokeless tobacco: Never Used  Substance and Sexual Activity  . Alcohol use: No    Alcohol/week: 0.0 oz  . Drug use: No  . Sexual activity: Yes    Partners: Male    Birth control/protection: Surgical    Comment: TUBAL LIGATION, 1st sexual encounter at 71, has had 5   Lifestyle  .  Physical activity:    Days per week: Not on file    Minutes per session: Not on file  . Stress: Not on file  Relationships  . Social connections:    Talks on phone: Not on file    Gets together: Not on file    Attends religious service: Not on file    Active member of club or organization: Not on file    Attends meetings of clubs or organizations: Not on file    Relationship status: Not on file  . Intimate partner violence:    Fear of current or ex partner: Not on file    Emotionally abused: Not on file    Physically abused: Not on file    Forced sexual activity: Not on file  Other Topics Concern  . Not on file  Social History Narrative  . Not on file     Constitutional: Denies fever, malaise, fatigue, headache or abrupt weight changes.  Respiratory: Denies difficulty breathing, shortness of breath, cough or sputum production.   Cardiovascular: Denies chest pain, chest tightness, palpitations or swelling in the hands or feet.  Neurological: Denies dizziness, difficulty with memory, difficulty with speech or problems with balance and coordination.    No other specific complaints in a complete review of systems (except as listed in HPI above).     Objective:   Physical Exam   BP 132/88   Pulse 62   Temp 97.9 F (36.6 C) (Oral)   Wt 173 lb (78.5 kg)   LMP 02/24/2008   SpO2 98%   BMI 32.16 kg/m  Wt Readings from Last 3 Encounters:  09/07/17 173 lb (78.5 kg)  08/17/17 177 lb (80.3 kg)  07/06/17 176 lb (79.8 kg)    General: Appears her stated age, obese in NAD. Cardiovascular: Normal rate and rhythm. S1,S2 noted.  No murmur, rubs or gallops noted.  Pulmonary/Chest: Normal effort and positive vesicular breath sounds. No respiratory distress. No wheezes, rales or ronchi noted.  Neurological: Alert and oriented.   BMET    Component Value Date/Time   NA 140 08/17/2017 0844   K 4.3 08/17/2017 0844   CL 104 08/17/2017 0844   CO2 21 08/17/2017 0844   GLUCOSE 114  (H) 08/17/2017 0844   GLUCOSE 109 (H) 01/29/2017 0840   BUN 8 08/17/2017 0844   CREATININE 0.68 08/17/2017 0844   CREATININE  0.66 04/24/2014 0859   CALCIUM 9.7 08/17/2017 0844   GFRNONAA 97 08/17/2017 0844   GFRAA 112 08/17/2017 0844    Lipid Panel     Component Value Date/Time   CHOL 219 (H) 08/17/2017 0844   TRIG 90 08/17/2017 0844   HDL 70 08/17/2017 0844   CHOLHDL 3.1 08/17/2017 0844   CHOLHDL 3 03/03/2016 0912   VLDL 14.2 03/03/2016 0912   LDLCALC 131 (H) 08/17/2017 0844    CBC    Component Value Date/Time   WBC 5.6 08/17/2017 0844   WBC 4.9 01/29/2017 0840   RBC 4.52 08/17/2017 0844   RBC 4.56 01/29/2017 0840   HGB 12.6 08/17/2017 0844   HCT 39.1 08/17/2017 0844   PLT 344 08/17/2017 0844   MCV 87 08/17/2017 0844   MCH 27.9 08/17/2017 0844   MCH 27.8 04/24/2014 0859   MCHC 32.2 08/17/2017 0844   MCHC 32.1 01/29/2017 0840   RDW 14.2 08/17/2017 0844   LYMPHSABS 3.5 (H) 07/02/2016 0943   MONOABS 0.5 04/24/2014 0859   EOSABS 0.1 07/02/2016 0943   BASOSABS 0.0 07/02/2016 0943    Hgb A1C Lab Results  Component Value Date   HGBA1C 6.3 (H) 08/17/2017           Assessment & Plan:

## 2017-09-08 NOTE — Assessment & Plan Note (Signed)
Continue Atorvastatin as prescribed Reinforced low fat diet CMET and Lipid profile future oredered

## 2017-09-08 NOTE — Assessment & Plan Note (Signed)
Improved but still not at goal Increase Lisinopril to 20 mg daily, eRx sent to pharmacy Continue Amlodipine Reinforced DASH diet and exercise for weight loss  Update me in 2 weeks with your BP readings

## 2017-09-08 NOTE — Patient Instructions (Signed)
Fat and Cholesterol Restricted Diet Getting too much fat and cholesterol in your diet may cause health problems. Following this diet helps keep your fat and cholesterol at normal levels. This can keep you from getting sick. What types of fat should I choose?  Choose monosaturated and polyunsaturated fats. These are found in foods such as olive oil, canola oil, flaxseeds, walnuts, almonds, and seeds.  Eat more omega-3 fats. Good choices include salmon, mackerel, sardines, tuna, flaxseed oil, and ground flaxseeds.  Limit saturated fats. These are in animal products such as meats, butter, and cream. They can also be in plant products such as palm oil, palm kernel oil, and coconut oil.  Avoid foods with partially hydrogenated oils in them. These contain trans fats. Examples of foods that have trans fats are stick margarine, some tub margarines, cookies, crackers, and other baked goods. What general guidelines do I need to follow?  Check food labels. Look for the words "trans fat" and "saturated fat."  When preparing a meal: ? Fill half of your plate with vegetables and green salads. ? Fill one fourth of your plate with whole grains. Look for the word "whole" as the first word in the ingredient list. ? Fill one fourth of your plate with lean protein foods.  Eat more foods that have fiber, like apples, carrots, beans, peas, and barley.  Eat more home-cooked foods. Eat less at restaurants and buffets.  Limit or avoid alcohol.  Limit foods high in starch and sugar.  Limit fried foods.  Cook foods without frying them. Baking, boiling, grilling, and broiling are all great options.  Lose weight if you are overweight. Losing even a small amount of weight can help your overall health. It can also help prevent diseases such as diabetes and heart disease. What foods can I eat? Grains Whole grains, such as whole wheat or whole grain breads, crackers, cereals, and pasta. Unsweetened oatmeal,  bulgur, barley, quinoa, or brown rice. Corn or whole wheat flour tortillas. Vegetables Fresh or frozen vegetables (raw, steamed, roasted, or grilled). Green salads. Fruits All fresh, canned (in natural juice), or frozen fruits. Meat and Other Protein Products Ground beef (85% or leaner), grass-fed beef, or beef trimmed of fat. Skinless chicken or turkey. Ground chicken or turkey. Pork trimmed of fat. All fish and seafood. Eggs. Dried beans, peas, or lentils. Unsalted nuts or seeds. Unsalted canned or dry beans. Dairy Low-fat dairy products, such as skim or 1% milk, 2% or reduced-fat cheeses, low-fat ricotta or cottage cheese, or plain low-fat yogurt. Fats and Oils Tub margarines without trans fats. Light or reduced-fat mayonnaise and salad dressings. Avocado. Olive, canola, sesame, or safflower oils. Natural peanut or almond butter (choose ones without added sugar and oil). The items listed above may not be a complete list of recommended foods or beverages. Contact your dietitian for more options. What foods are not recommended? Grains White bread. White pasta. White rice. Cornbread. Bagels, pastries, and croissants. Crackers that contain trans fat. Vegetables White potatoes. Corn. Creamed or fried vegetables. Vegetables in a cheese sauce. Fruits Dried fruits. Canned fruit in light or heavy syrup. Fruit juice. Meat and Other Protein Products Fatty cuts of meat. Ribs, chicken wings, bacon, sausage, bologna, salami, chitterlings, fatback, hot dogs, bratwurst, and packaged luncheon meats. Liver and organ meats. Dairy Whole or 2% milk, cream, half-and-half, and cream cheese. Whole milk cheeses. Whole-fat or sweetened yogurt. Full-fat cheeses. Nondairy creamers and whipped toppings. Processed cheese, cheese spreads, or cheese curds. Sweets and Desserts Corn   syrup, sugars, honey, and molasses. Candy. Jam and jelly. Syrup. Sweetened cereals. Cookies, pies, cakes, donuts, muffins, and ice  cream. Fats and Oils Butter, stick margarine, lard, shortening, ghee, or bacon fat. Coconut, palm kernel, or palm oils. Beverages Alcohol. Sweetened drinks (such as sodas, lemonade, and fruit drinks or punches). The items listed above may not be a complete list of foods and beverages to avoid. Contact your dietitian for more information. This information is not intended to replace advice given to you by your health care provider. Make sure you discuss any questions you have with your health care provider. Document Released: 10/06/2011 Document Revised: 12/12/2015 Document Reviewed: 07/06/2013 Elsevier Interactive Patient Education  2018 [employer]  

## 2017-09-13 ENCOUNTER — Other Ambulatory Visit: Payer: Self-pay | Admitting: Internal Medicine

## 2017-09-15 ENCOUNTER — Other Ambulatory Visit: Payer: Self-pay | Admitting: Internal Medicine

## 2017-10-04 ENCOUNTER — Other Ambulatory Visit: Payer: Self-pay | Admitting: Internal Medicine

## 2017-10-13 ENCOUNTER — Other Ambulatory Visit: Payer: Self-pay | Admitting: Internal Medicine

## 2017-11-05 ENCOUNTER — Other Ambulatory Visit: Payer: Self-pay | Admitting: Internal Medicine

## 2017-11-06 ENCOUNTER — Other Ambulatory Visit: Payer: Self-pay | Admitting: Internal Medicine

## 2017-11-09 ENCOUNTER — Other Ambulatory Visit: Payer: Self-pay | Admitting: Internal Medicine

## 2017-11-17 ENCOUNTER — Other Ambulatory Visit (INDEPENDENT_AMBULATORY_CARE_PROVIDER_SITE_OTHER): Payer: 59

## 2017-11-17 DIAGNOSIS — E875 Hyperkalemia: Secondary | ICD-10-CM | POA: Diagnosis not present

## 2017-11-17 DIAGNOSIS — E7800 Pure hypercholesterolemia, unspecified: Secondary | ICD-10-CM

## 2017-11-17 NOTE — Addendum Note (Signed)
Addended by: Lendon Collar on: 11/17/2017 07:55 AM   Modules accepted: Orders

## 2017-11-18 LAB — COMPREHENSIVE METABOLIC PANEL
ALT: 9 IU/L (ref 0–32)
AST: 17 IU/L (ref 0–40)
Albumin/Globulin Ratio: 2 (ref 1.2–2.2)
Albumin: 4.3 g/dL (ref 3.5–5.5)
Alkaline Phosphatase: 88 IU/L (ref 39–117)
BUN/Creatinine Ratio: 22 (ref 9–23)
BUN: 14 mg/dL (ref 6–24)
Bilirubin Total: 0.3 mg/dL (ref 0.0–1.2)
CO2: 24 mmol/L (ref 20–29)
Calcium: 9.5 mg/dL (ref 8.7–10.2)
Chloride: 106 mmol/L (ref 96–106)
Creatinine, Ser: 0.65 mg/dL (ref 0.57–1.00)
GFR calc Af Amer: 114 mL/min/{1.73_m2} (ref >59)
GFR calc non Af Amer: 99 mL/min/{1.73_m2} (ref >59)
Globulin, Total: 2.2 g/dL (ref 1.5–4.5)
Glucose: 118 mg/dL — ABNORMAL HIGH (ref 65–99)
Potassium: 4.3 mmol/L (ref 3.5–5.2)
Sodium: 142 mmol/L (ref 134–144)
Total Protein: 6.5 g/dL (ref 6.0–8.5)

## 2017-11-18 LAB — LIPID PANEL
Chol/HDL Ratio: 2.3 ratio (ref 0.0–4.4)
Cholesterol, Total: 152 mg/dL (ref 100–199)
HDL: 65 mg/dL (ref >39)
LDL Calculated: 74 mg/dL (ref 0–99)
Triglycerides: 67 mg/dL (ref 0–149)
VLDL Cholesterol Cal: 13 mg/dL (ref 5–40)

## 2017-12-04 ENCOUNTER — Other Ambulatory Visit: Payer: Self-pay | Admitting: Rheumatology

## 2017-12-06 NOTE — Telephone Encounter (Signed)
Last Visit: 07/06/17 Next Visit: 01/06/18  Okay to refill per Dr. Deveshwar 

## 2017-12-15 NOTE — Progress Notes (Signed)
Office Visit Note  Patient: Courtney Guzman             Date of Birth: [birth year]           MRN: [id number]             PCP: Jearld Fenton, NP Referring: Jearld Fenton, NP Visit Date: 12/29/2017 Occupation: @GUAROCC @  Subjective:  Increased pain in muscles.   History of Present Illness: [name] is a 57 y.o. female fibromyalgia syndrome.  She states she has been under a lot of stress as her father-in-law passed recently.  She has been experiencing increased generalized pain in her muscles.  She also has noticed a lot of tender points.  Activities of Daily Living:  Patient reports morning stiffness for 5 minutes.   Patient Denies nocturnal pain.  Difficulty dressing/grooming: Denies Difficulty climbing stairs: Denies Difficulty getting out of chair: Denies Difficulty using hands for taps, buttons, cutlery, and/or writing: Denies  Review of Systems  Constitutional: Positive for fatigue. Negative for night sweats, weight gain and weight loss.  HENT: Negative for mouth sores, trouble swallowing, trouble swallowing, mouth dryness and nose dryness.   Eyes: Negative for pain, redness, visual disturbance and dryness.  Respiratory: Negative for cough, hemoptysis, shortness of breath and difficulty breathing.   Cardiovascular: Negative for chest pain, palpitations, hypertension, irregular heartbeat and swelling in legs/feet.  Gastrointestinal: Negative for abdominal pain, blood in stool, constipation, diarrhea, nausea and vomiting.  Endocrine: Negative for increased urination.  Genitourinary: Negative for nocturia, pelvic pain and vaginal dryness.  Musculoskeletal: Positive for joint swelling and morning stiffness. Negative for arthralgias, joint pain, myalgias, muscle weakness, muscle tenderness and myalgias.  Skin: Negative for color change, rash, hair loss, skin tightness, ulcers and sensitivity to sunlight.  Allergic/Immunologic: Negative for susceptible to infections.    Neurological: Negative for dizziness, light-headedness, numbness, headaches, memory loss, night sweats and weakness.  Hematological: Negative for bruising/bleeding tendency and swollen glands.  Psychiatric/Behavioral: Negative for depressed mood, confusion and sleep disturbance. The patient is not nervous/anxious.     PMFS History:  Patient Active Problem List   Diagnosis Date Noted  . Arthritis 08/17/2017  . Essential hypertension 08/11/2016  . Primary insomnia 07/01/2016  . 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 Seasonal allergic rhinitis due to pollen 03/03/2016  . Type 2 diabetes mellitus without complication, without long-term current use of insulin ([place]) 06/14/2015  . Hyperlipidemia 05/10/2014  . Family history of ovarian cancer 04/08/2012  . Osteopenia 03/22/2012  . GERD (gastroesophageal reflux disease)   . Fibromyalgia syndrome 07/26/2008    Past Medical History:  Diagnosis Date  . Allergy    seasonal  . Arthritis   . Diabetes mellitus without complication ([place])   . Fibromyalgia    dr. Marveen Reeks  . GERD (gastroesophageal reflux disease)   . Hyperlipidemia   . Hypertension   . Wheezes    with season changes     Family History  Problem Relation Age of Onset  . Hypertension Mother   . Diabetes Sister   . Cancer Maternal Grandmother        OVARIAN  . Ovarian cancer Maternal Grandmother   . Colon cancer Neg Hx   . Colon polyps Neg Hx    Past Surgical History:  Procedure Laterality Date  . [place]   with BTL  . COLONOSCOPY    . COLONOSCOPY W/ POLYPECTOMY  2006   BENIGN ADENOMA   . POLYPECTOMY    . TONSILLECTOMY AND  ADENOIDECTOMY     Social History   Social History Narrative  . Not on file    Objective: Vital Signs: BP (!) 132/91 (BP Location: Left Arm, Patient Position: Sitting, Cuff Size: Normal)   Pulse 62   Resp 15   Ht 5' (1.524 m)   Wt 175 lb 12.8 oz (79.7 kg)   LMP 02/24/2008   BMI 34.33 kg/m    Physical Exam   Constitutional: She is oriented to person, place, and time. She appears well-developed and well-nourished.  HENT:  Head: Normocephalic and atraumatic.  Eyes: Conjunctivae and EOM are normal.  Neck: Normal range of motion.  Cardiovascular: Normal rate, regular rhythm, normal heart sounds and intact distal pulses.  Pulmonary/Chest: Effort normal and breath sounds normal.  Abdominal: Soft. Bowel sounds are normal.  Lymphadenopathy:    She has no cervical adenopathy.  Neurological: She is alert and oriented to person, place, and time.  Skin: Skin is warm and dry. Capillary refill takes less than 2 seconds.  Psychiatric: She has a normal mood and affect. Her behavior is normal.  Nursing note and vitals reviewed.    Musculoskeletal Exam: C-spine thoracic lumbar spine good range of motion.  Shoulder joints elbow joints wrist joint MCPs PIPs DIPs were in good range of motion with no synovitis.  Hip joints knee joints ankles MTPs PIPs were in good range of motion with no synovitis.  She has bilateral trapezius spasm and bilateral trochanteric bursa tenderness.  CDAI Exam: CDAI Score: Not documented Patient Global Assessment: Not documented; Provider Global Assessment: Not documented Swollen: Not documented; Tender: Not documented Joint Exam   Not documented   There is currently no information documented on the homunculus. Go to the Rheumatology activity and complete the homunculus joint exam.  Investigation: No additional findings.  Imaging: No results found.  Recent Labs: Lab Results  Component Value Date   WBC 5.6 08/17/2017   HGB 12.6 08/17/2017   PLT 344 08/17/2017   NA 142 11/17/2017   K 4.3 11/17/2017   CL 106 11/17/2017   CO2 24 11/17/2017   GLUCOSE 118 (H) 11/17/2017   BUN 14 11/17/2017   CREATININE 0.65 11/17/2017   BILITOT 0.3 11/17/2017   ALKPHOS 88 11/17/2017   AST 17 11/17/2017   ALT 9 11/17/2017   PROT 6.5 11/17/2017   ALBUMIN 4.3 11/17/2017   CALCIUM 9.5  11/17/2017   GFRAA 114 11/17/2017    Speciality Comments: No specialty comments available.  Procedures:  No procedures performed Allergies: Patient has no known allergies.   Assessment / Plan:     Visit Diagnoses: Fibromyalgia syndrome-patient is having a flare due to increase in stress.  She recently lost her father-in-law.  She has been having increased tender points and generalized myalgias.  She is on Neurontin and Robaxin which has been helpful.  She recently filled the medication.  Need for regular exercise was emphasized.  Other fatigue-related to insomnia.  Primary insomnia-good sleep hygiene was discussed.  Osteopenia of multiple sites-she is on calcium and vitamin D.  History of bilateral carpal tunnel release-doing well.  Other medical problems are listed as follows:  History of gastroesophageal reflux (GERD)  Type 2 diabetes mellitus without complication, without long-term current use of insulin (HCC)  History of hyperlipidemia  Essential hypertension  Raised intraocular pressure, unspecified laterality   Orders: No orders of the defined types were placed in this encounter.  No orders of the defined types were placed in this encounter.  Follow-Up Instructions: Return in about 6 months (around 06/29/2018) for FMS.   Bo Merino, MD  Note - This record has been created using Editor, commissioning.  Chart creation errors have been sought, but may not always  have been located. Such creation errors do not reflect on  the standard of medical care.

## 2017-12-29 ENCOUNTER — Ambulatory Visit: Payer: 59 | Admitting: Rheumatology

## 2017-12-29 ENCOUNTER — Encounter: Payer: Self-pay | Admitting: Rheumatology

## 2017-12-29 VITALS — BP 132/91 | HR 62 | Resp 15 | Ht 60.0 in | Wt 175.8 lb

## 2017-12-29 DIAGNOSIS — Z9889 Other specified postprocedural states: Secondary | ICD-10-CM

## 2017-12-29 DIAGNOSIS — F5101 Primary insomnia: Secondary | ICD-10-CM | POA: Diagnosis not present

## 2017-12-29 DIAGNOSIS — Z8639 Personal history of other endocrine, nutritional and metabolic disease: Secondary | ICD-10-CM

## 2017-12-29 DIAGNOSIS — M8589 Other specified disorders of bone density and structure, multiple sites: Secondary | ICD-10-CM

## 2017-12-29 DIAGNOSIS — M797 Fibromyalgia: Secondary | ICD-10-CM | POA: Diagnosis not present

## 2017-12-29 DIAGNOSIS — Z8719 Personal history of other diseases of the digestive system: Secondary | ICD-10-CM

## 2017-12-29 DIAGNOSIS — R5383 Other fatigue: Secondary | ICD-10-CM

## 2017-12-29 DIAGNOSIS — H40059 Ocular hypertension, unspecified eye: Secondary | ICD-10-CM

## 2017-12-29 DIAGNOSIS — E119 Type 2 diabetes mellitus without complications: Secondary | ICD-10-CM

## 2017-12-29 DIAGNOSIS — I10 Essential (primary) hypertension: Secondary | ICD-10-CM

## 2017-12-31 ENCOUNTER — Other Ambulatory Visit: Payer: Self-pay | Admitting: Internal Medicine

## 2018-01-06 ENCOUNTER — Ambulatory Visit: Payer: 59 | Admitting: Rheumatology

## 2018-01-13 DIAGNOSIS — H401131 Primary open-angle glaucoma, bilateral, mild stage: Secondary | ICD-10-CM | POA: Diagnosis not present

## 2018-02-01 ENCOUNTER — Other Ambulatory Visit: Payer: Self-pay | Admitting: Internal Medicine

## 2018-03-06 ENCOUNTER — Other Ambulatory Visit: Payer: Self-pay | Admitting: Internal Medicine

## 2018-03-26 ENCOUNTER — Other Ambulatory Visit: Payer: Self-pay | Admitting: Rheumatology

## 2018-03-28 NOTE — Telephone Encounter (Signed)
Last visit: 12/29/17 Next visit: 06/29/18  Okay to refill per Dr. Estanislado Pandy

## 2018-04-23 ENCOUNTER — Other Ambulatory Visit: Payer: Self-pay | Admitting: Internal Medicine

## 2018-04-29 ENCOUNTER — Other Ambulatory Visit: Payer: Self-pay | Admitting: Internal Medicine

## 2018-05-05 ENCOUNTER — Other Ambulatory Visit: Payer: Self-pay | Admitting: Internal Medicine

## 2018-05-05 DIAGNOSIS — Z1231 Encounter for screening mammogram for malignant neoplasm of breast: Secondary | ICD-10-CM

## 2018-05-17 DIAGNOSIS — H401131 Primary open-angle glaucoma, bilateral, mild stage: Secondary | ICD-10-CM | POA: Diagnosis not present

## 2018-05-27 ENCOUNTER — Other Ambulatory Visit: Payer: Self-pay | Admitting: Rheumatology

## 2018-05-27 ENCOUNTER — Other Ambulatory Visit: Payer: Self-pay | Admitting: Internal Medicine

## 2018-05-27 NOTE — Telephone Encounter (Signed)
Last visit: 12/29/17 Next visit: 06/29/18  Okay to refill per Dr. Estanislado Pandy

## 2018-06-07 ENCOUNTER — Ambulatory Visit
Admission: RE | Admit: 2018-06-07 | Discharge: 2018-06-07 | Disposition: A | Discharge status: Home / Self Care | Payer: 59 | Source: Ambulatory Visit | Attending: Internal Medicine | Admitting: Internal Medicine

## 2018-06-07 DIAGNOSIS — Z1231 Encounter for screening mammogram for malignant neoplasm of breast: Secondary | ICD-10-CM | POA: Diagnosis not present

## 2018-06-15 NOTE — Progress Notes (Deleted)
Office Visit Note  Patient: Courtney Guzman             Date of Birth: 1960/09/21           MRN: [id number]             PCP: Jearld Fenton, NP Referring: Jearld Fenton, NP Visit Date: 06/29/2018 Occupation: @GUAROCC @  Subjective:  No chief complaint on file.   History of Present Illness: Courtney Guzman is a 58 y.o. female ***   Activities of Daily Living:  Patient reports morning stiffness for *** {minute/hour:19697}.   Patient {ACTIONS;DENIES/REPORTS:21021675::"Denies"} nocturnal pain.  Difficulty dressing/grooming: {ACTIONS;DENIES/REPORTS:21021675::"Denies"} Difficulty climbing stairs: {ACTIONS;DENIES/REPORTS:21021675::"Denies"} Difficulty getting out of chair: {ACTIONS;DENIES/REPORTS:21021675::"Denies"} Difficulty using hands for taps, buttons, cutlery, and/or writing: {ACTIONS;DENIES/REPORTS:21021675::"Denies"}  No Rheumatology ROS completed.   PMFS History:  Patient Active Problem List   Diagnosis Date Noted  . Arthritis 08/17/2017  . Essential hypertension 08/11/2016  . Primary insomnia 07/01/2016  . 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 Seasonal allergic rhinitis due to pollen 03/03/2016  . Type 2 diabetes mellitus without complication, without long-term current use of insulin ([place]) 06/14/2015  . Hyperlipidemia 05/10/2014  . Family history of ovarian cancer 04/08/2012  . Osteopenia 03/22/2012  . GERD (gastroesophageal reflux disease)   . Fibromyalgia syndrome 07/26/2008    Past Medical History:  Diagnosis Date  . Allergy    seasonal  . Arthritis   . Diabetes mellitus without complication ([place])   . Fibromyalgia    dr. Marveen Reeks  . GERD (gastroesophageal reflux disease)   . Hyperlipidemia   . Hypertension   . Wheezes    with season changes     Family History  Problem Relation Age of Onset  . Hypertension Mother   . Diabetes Sister   . Cancer Maternal Grandmother        OVARIAN  . Ovarian cancer Maternal Grandmother   . Colon cancer Neg Hx   .  Colon polyps Neg Hx    Past Surgical History:  Procedure Laterality Date  . [place]   with BTL  . COLONOSCOPY    . COLONOSCOPY W/ POLYPECTOMY  2006   BENIGN ADENOMA   . POLYPECTOMY    . TONSILLECTOMY AND ADENOIDECTOMY     Social History   Social History Narrative  . Not on file   Immunization History  Administered Date(s) Administered  . Influenza,inj,Quad PF,6+ Mos 01/18/2014, 02/19/2016  . Influenza-Unspecified 01/27/2017  . PPD Test 03/17/2011  . Tdap 04/11/2013, 03/08/2016     Objective: Vital Signs: LMP 02/24/2008    Physical Exam   Musculoskeletal Exam: ***  CDAI Exam: CDAI Score: Not documented Patient Global Assessment: Not documented; Provider Global Assessment: Not documented Swollen: Not documented; Tender: Not documented Joint Exam   Not documented   There is currently no information documented on the homunculus. Go to the Rheumatology activity and complete the homunculus joint exam.  Investigation: No additional findings.  Imaging: Mm 3d Screen Breast Bilateral  Result Date: 06/08/2018 CLINICAL DATA:  Screening. EXAM: DIGITAL SCREENING BILATERAL MAMMOGRAM WITH TOMO AND CAD COMPARISON:  Previous exam(s). ACR Breast Density Category b: There are scattered areas of fibroglandular density. FINDINGS: There are no findings suspicious for malignancy. Images were processed with CAD. IMPRESSION: No mammographic evidence of malignancy. A result letter of this screening mammogram will be mailed directly to the patient. RECOMMENDATION: Screening mammogram in one year. (Code:SM-B-01Y) BI-RADS CATEGORY  1: Negative. Electronically Signed   By: Ammie Ferrier M.D.  On: 06/08/2018 08:34    Recent Labs: Lab Results  Component Value Date   WBC 5.6 08/17/2017   HGB 12.6 08/17/2017   PLT 344 08/17/2017   NA 142 11/17/2017   K 4.3 11/17/2017   CL 106 11/17/2017   CO2 24 11/17/2017   GLUCOSE 118 (H) 11/17/2017   BUN 14 11/17/2017   CREATININE  0.65 11/17/2017   BILITOT 0.3 11/17/2017   ALKPHOS 88 11/17/2017   AST 17 11/17/2017   ALT 9 11/17/2017   PROT 6.5 11/17/2017   ALBUMIN 4.3 11/17/2017   CALCIUM 9.5 11/17/2017   GFRAA 114 11/17/2017    Speciality Comments: No specialty comments available.  Procedures:  No procedures performed Allergies: Patient has no known allergies.   Assessment / Plan:     Visit Diagnoses: Fibromyalgia syndrome  Primary insomnia  Other fatigue  Osteopenia of multiple sites  History of bilateral carpal tunnel release  History of gastroesophageal reflux (GERD)  Type 2 diabetes mellitus without complication, without long-term current use of insulin (HCC)  History of hyperlipidemia  Essential hypertension   Orders: No orders of the defined types were placed in this encounter.  No orders of the defined types were placed in this encounter.   Face-to-face time spent with patient was *** minutes. Greater than 50% of time was spent in counseling and coordination of care.  Follow-Up Instructions: No follow-ups on file.   Ofilia Neas, PA-C  Note - This record has been created using Dragon software.  Chart creation errors have been sought, but may not always  have been located. Such creation errors do not reflect on  the standard of medical care.

## 2018-06-24 ENCOUNTER — Other Ambulatory Visit: Payer: Self-pay | Admitting: Rheumatology

## 2018-06-24 ENCOUNTER — Other Ambulatory Visit: Payer: Self-pay | Admitting: Internal Medicine

## 2018-06-24 NOTE — Telephone Encounter (Signed)
Last visit: 12/29/17 Next visit: 06/29/18  Okay to refill per Dr. Estanislado Pandy

## 2018-06-27 NOTE — Progress Notes (Signed)
Office Visit Note  Patient: Courtney Guzman             Date of Birth: [birth year]           MRN: [id number]             PCP: Jearld Fenton, NP Referring: Jearld Fenton, NP Visit Date: 07/05/2018 Occupation: @GUAROCC @  Subjective:  Trapezius muscle tension   History of Present Illness: Courtney Guzman is a 58 y.o. female with history of fibromyalgia and osteopenia. She takes Robaxin 500 mg BID PRN for muscle spasms and Gabapentin 100 mg 2 capsules TID.  She continues have generalized muscle aches and muscle tenderness due to fibromyalgia.  She has been having increased trapezius muscle tension and muscle spasms bilaterally.  She does not feels that Robaxin has been as effective as it was in the past.  She states that she does have a TENS unit and would like to get new electrode so she can start using it more frequently.  She reports her level of fatigue has been stable recently and she has been sleeping well.  She states that on Sunday she developed increased pain and swelling in bilateral hands that resolved yesterday.  She denies any joint pain or joint swelling at this time.    Activities of Daily Living:  Patient reports morning stiffness for 5 minutes.   Patient Denies nocturnal pain.  Difficulty dressing/grooming: Denies Difficulty climbing stairs: Denies Difficulty getting out of chair: Denies Difficulty using hands for taps, buttons, cutlery, and/or writing: Denies  Review of Systems  Constitutional: Positive for fatigue.  HENT: Negative for mouth sores, mouth dryness and nose dryness.   Eyes: Negative for pain, visual disturbance and dryness.  Respiratory: Positive for wheezing. Negative for cough, hemoptysis, shortness of breath and difficulty breathing.   Cardiovascular: Negative for chest pain, palpitations, hypertension and swelling in legs/feet.  Gastrointestinal: Negative for blood in stool, constipation and diarrhea.  Endocrine: Negative for increased urination.    Genitourinary: Negative for painful urination.  Musculoskeletal: Positive for myalgias, morning stiffness, muscle tenderness and myalgias. Negative for arthralgias, joint pain, joint swelling and muscle weakness.  Skin: Negative for color change, pallor, rash, hair loss, nodules/bumps, skin tightness, ulcers and sensitivity to sunlight.  Neurological: Negative for dizziness, numbness, headaches and weakness.  Hematological: Negative for swollen glands.  Psychiatric/Behavioral: Negative for depressed mood and sleep disturbance. The patient is not nervous/anxious.     PMFS History:  Patient Active Problem List   Diagnosis Date Noted  . Arthritis 08/17/2017  . Essential hypertension 08/11/2016  . Primary insomnia 07/01/2016  . 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 Seasonal allergic rhinitis due to pollen 03/03/2016  . Type 2 diabetes mellitus without complication, without long-term current use of insulin ([place]) 06/14/2015  . Hyperlipidemia 05/10/2014  . Family history of ovarian cancer 04/08/2012  . Osteopenia 03/22/2012  . GERD (gastroesophageal reflux disease)   . Fibromyalgia syndrome 07/26/2008    Past Medical History:  Diagnosis Date  . Allergy    seasonal  . Arthritis   . Diabetes mellitus without complication ([place])   . Fibromyalgia    dr. Marveen Reeks  . GERD (gastroesophageal reflux disease)   . Hyperlipidemia   . Hypertension   . Wheezes    with season changes     Family History  Problem Relation Age of Onset  . Hypertension Mother   . Diabetes Sister   . Cancer Maternal Grandmother        OVARIAN  .  Ovarian cancer Maternal Grandmother   . Colon cancer Neg Hx   . Colon polyps Neg Hx    Past Surgical History:  Procedure Laterality Date  . [place]   with BTL  . COLONOSCOPY    . COLONOSCOPY W/ POLYPECTOMY  2006   BENIGN ADENOMA   . POLYPECTOMY    . TONSILLECTOMY AND ADENOIDECTOMY     Social History   Social History Narrative  . Not on  file   Immunization History  Administered Date(s) Administered  . Influenza,inj,Quad PF,6+ Mos 01/18/2014, 02/19/2016  . Influenza-Unspecified 01/27/2017  . PPD Test 03/17/2011  . Tdap 04/11/2013, 03/08/2016     Objective: Vital Signs: BP 124/84 (BP Location: Left Arm, Patient Position: Sitting, Cuff Size: Small)   Pulse 73   Resp 12   Ht 5' (1.524 m)   Wt 172 lb 3.2 oz (78.1 kg)   LMP 02/24/2008   BMI 33.63 kg/m    Physical Exam Vitals signs and nursing note reviewed.  Constitutional:      Appearance: She is well-developed.  HENT:     Head: Normocephalic and atraumatic.  Eyes:     Conjunctiva/sclera: Conjunctivae normal.  Neck:     Musculoskeletal: Normal range of motion.  Cardiovascular:     Rate and Rhythm: Normal rate and regular rhythm.     Heart sounds: Normal heart sounds.  Pulmonary:     Effort: Pulmonary effort is normal.     Breath sounds: Normal breath sounds.  Abdominal:     General: Bowel sounds are normal.     Palpations: Abdomen is soft.  Lymphadenopathy:     Cervical: No cervical adenopathy.  Skin:    General: Skin is warm and dry.     Capillary Refill: Capillary refill takes less than 2 seconds.  Neurological:     Mental Status: She is alert and oriented to person, place, and time.  Psychiatric:        Behavior: Behavior normal.      Musculoskeletal Exam: C-spine, thoracic spine, lumbar spine good range of motion with no discomfort. trapezius muscle spasms. No midline spinal tenderness.  No SI joint tenderness.  Shoulder joints, elbow joints, wrist joints, MCPs, PIPs and DIPs good range of motion with no synovitis.  She has complete fist formation bilaterally.  Hip joints, knee joints, ankle joints, MCPs, PIPs, DIPs good range of motion with no synovitis.  No warmth or effusion bilateral knee joints.  No tenderness or swelling of ankle joints.  She has tenderness over bilateral trochanteric bursa.  CDAI Exam: CDAI Score: Not documented Patient  Global Assessment: Not documented; Provider Global Assessment: Not documented Swollen: Not documented; Tender: Not documented Joint Exam   Not documented   There is currently no information documented on the homunculus. Go to the Rheumatology activity and complete the homunculus joint exam.  Investigation: No additional findings.  Imaging: Mm 3d Screen Breast Bilateral  Result Date: 06/08/2018 CLINICAL DATA:  Screening. EXAM: DIGITAL SCREENING BILATERAL MAMMOGRAM WITH TOMO AND CAD COMPARISON:  Previous exam(s). ACR Breast Density Category b: There are scattered areas of fibroglandular density. FINDINGS: There are no findings suspicious for malignancy. Images were processed with CAD. IMPRESSION: No mammographic evidence of malignancy. A result letter of this screening mammogram will be mailed directly to the patient. RECOMMENDATION: Screening mammogram in one year. (Code:SM-B-01Y) BI-RADS CATEGORY  1: Negative. Electronically Signed   By: Ammie Ferrier M.D.   On: 06/08/2018 08:34    Recent Labs: Lab Results  Component Value Date   WBC 5.6 08/17/2017   HGB 12.6 08/17/2017   PLT 344 08/17/2017   NA 142 11/17/2017   K 4.3 11/17/2017   CL 106 11/17/2017   CO2 24 11/17/2017   GLUCOSE 118 (H) 11/17/2017   BUN 14 11/17/2017   CREATININE 0.65 11/17/2017   BILITOT 0.3 11/17/2017   ALKPHOS 88 11/17/2017   AST 17 11/17/2017   ALT 9 11/17/2017   PROT 6.5 11/17/2017   ALBUMIN 4.3 11/17/2017   CALCIUM 9.5 11/17/2017   GFRAA 114 11/17/2017    Speciality Comments: No specialty comments available.  Procedures:  No procedures performed Allergies: Patient has no known allergies.   Assessment / Plan:     Visit Diagnoses: Fibromyalgia syndrome -She has generalized hyperalgesia and positive tender points. She has generalized muscle aches and muscle tenderness due to fibromyalgia.  She has been having worsening trapezius muscle tension and spasms.  She declined trigger point injections.  She continues to take Gabapentin 100 mg 2 capsules po TID and Robaxin 500 mg BID PRN for muscle spasms.  She does not need any refills at this time.  She was given a prescription for electrode pads for TENS unit. Her level of fatigue has been stable and she has been sleeping better at night.  The importance of regular exercise and good sleep hygiene was discussed.  She will follow up in 6 months.   Primary insomnia: She has been sleeping well at night.  Good sleep hygiene was discussed.  Other fatigue: She has chronic fatigue but it has been stable.  Osteopenia of multiple sites: She is taking a calcium supplement.    History of bilateral carpal tunnel release: She is asymptomatic at this.   Other medical conditions are listed as follows:   Type 2 diabetes mellitus without complication, without long-term current use of insulin (HCC)  Raised intraocular pressure, unspecified laterality  History of gastroesophageal reflux (GERD)  Essential hypertension  History of hyperlipidemia   Orders: No orders of the defined types were placed in this encounter.  No orders of the defined types were placed in this encounter.    Follow-Up Instructions: Return in about 6 months (around 01/05/2019) for Fibromyalgia.   Ofilia Neas, PA-C   I examined and evaluated the patient with Hazel Sams PA.  Patient continues to have some discomfort from fibromyalgia.  She no synovitis on my examination.  The plan of care was discussed as noted above.  Bo Merino, MD  Note - This record has been created using Editor, commissioning.  Chart creation errors have been sought, but may not always  have been located. Such creation errors do not reflect on  the standard of medical care.

## 2018-06-29 ENCOUNTER — Ambulatory Visit: Payer: 59 | Admitting: Physician Assistant

## 2018-07-05 ENCOUNTER — Other Ambulatory Visit: Payer: Self-pay

## 2018-07-05 ENCOUNTER — Ambulatory Visit: Payer: 59 | Admitting: Rheumatology

## 2018-07-05 ENCOUNTER — Encounter: Payer: Self-pay | Admitting: Rheumatology

## 2018-07-05 VITALS — BP 124/84 | HR 73 | Resp 12 | Ht 60.0 in | Wt 172.2 lb

## 2018-07-05 DIAGNOSIS — Z8639 Personal history of other endocrine, nutritional and metabolic disease: Secondary | ICD-10-CM

## 2018-07-05 DIAGNOSIS — M797 Fibromyalgia: Secondary | ICD-10-CM

## 2018-07-05 DIAGNOSIS — R5383 Other fatigue: Secondary | ICD-10-CM

## 2018-07-05 DIAGNOSIS — M8589 Other specified disorders of bone density and structure, multiple sites: Secondary | ICD-10-CM | POA: Diagnosis not present

## 2018-07-05 DIAGNOSIS — I10 Essential (primary) hypertension: Secondary | ICD-10-CM

## 2018-07-05 DIAGNOSIS — E119 Type 2 diabetes mellitus without complications: Secondary | ICD-10-CM

## 2018-07-05 DIAGNOSIS — F5101 Primary insomnia: Secondary | ICD-10-CM

## 2018-07-05 DIAGNOSIS — H40059 Ocular hypertension, unspecified eye: Secondary | ICD-10-CM

## 2018-07-05 DIAGNOSIS — Z9889 Other specified postprocedural states: Secondary | ICD-10-CM

## 2018-07-05 DIAGNOSIS — Z8719 Personal history of other diseases of the digestive system: Secondary | ICD-10-CM

## 2018-07-18 ENCOUNTER — Other Ambulatory Visit: Payer: Self-pay | Admitting: Internal Medicine

## 2018-08-16 ENCOUNTER — Other Ambulatory Visit: Payer: Self-pay | Admitting: Internal Medicine

## 2018-08-19 ENCOUNTER — Encounter: Payer: Self-pay | Admitting: Internal Medicine

## 2018-08-19 ENCOUNTER — Other Ambulatory Visit: Payer: Self-pay | Admitting: Rheumatology

## 2018-08-19 NOTE — Telephone Encounter (Signed)
Last Visit: 07/05/18 Next visit: 01/05/19  Okay to refill per Dr. Estanislado Pandy

## 2018-09-03 ENCOUNTER — Other Ambulatory Visit: Payer: Self-pay | Admitting: Internal Medicine

## 2018-09-06 ENCOUNTER — Other Ambulatory Visit: Payer: Self-pay | Admitting: Internal Medicine

## 2018-10-12 ENCOUNTER — Other Ambulatory Visit: Payer: Self-pay | Admitting: Internal Medicine

## 2018-10-25 ENCOUNTER — Encounter: Payer: 59 | Admitting: Internal Medicine

## 2018-10-26 ENCOUNTER — Other Ambulatory Visit: Payer: Self-pay | Admitting: Internal Medicine

## 2018-10-27 ENCOUNTER — Encounter: Payer: Self-pay | Admitting: Internal Medicine

## 2018-10-27 ENCOUNTER — Ambulatory Visit (INDEPENDENT_AMBULATORY_CARE_PROVIDER_SITE_OTHER): Payer: 59 | Admitting: Internal Medicine

## 2018-10-27 ENCOUNTER — Other Ambulatory Visit: Payer: Self-pay

## 2018-10-27 VITALS — BP 132/90 | HR 62 | Temp 98.2°F | Ht 60.0 in | Wt 173.0 lb

## 2018-10-27 DIAGNOSIS — E119 Type 2 diabetes mellitus without complications: Secondary | ICD-10-CM

## 2018-10-27 DIAGNOSIS — M47816 Spondylosis without myelopathy or radiculopathy, lumbar region: Secondary | ICD-10-CM

## 2018-10-27 DIAGNOSIS — K219 Gastro-esophageal reflux disease without esophagitis: Secondary | ICD-10-CM

## 2018-10-27 DIAGNOSIS — M797 Fibromyalgia: Secondary | ICD-10-CM | POA: Diagnosis not present

## 2018-10-27 DIAGNOSIS — M8589 Other specified disorders of bone density and structure, multiple sites: Secondary | ICD-10-CM

## 2018-10-27 DIAGNOSIS — I10 Essential (primary) hypertension: Secondary | ICD-10-CM

## 2018-10-27 DIAGNOSIS — M1990 Unspecified osteoarthritis, unspecified site: Secondary | ICD-10-CM

## 2018-10-27 DIAGNOSIS — F5101 Primary insomnia: Secondary | ICD-10-CM

## 2018-10-27 DIAGNOSIS — E7800 Pure hypercholesterolemia, unspecified: Secondary | ICD-10-CM

## 2018-10-27 DIAGNOSIS — Z0001 Encounter for general adult medical examination with abnormal findings: Secondary | ICD-10-CM | POA: Diagnosis not present

## 2018-10-27 DIAGNOSIS — D508 Other iron deficiency anemias: Secondary | ICD-10-CM

## 2018-10-27 NOTE — Progress Notes (Signed)
Subjective:    Patient ID: Courtney Guzman, female    DOB: [birth year], 58 y.o.   MRN: [id number]  HPI   Pt presents to the clinic today for her annal exam. She is also due to follow up chronic conditions.   Osteoarthritis: Mainly in her back. She uses Lidoderm patches, Gabapentin and Methocarbamol as prescribed with good relief of symptoms.   HTN: Her BP today is 132/90. She is taking Lisinopril and Amlodipine as prescribed. ECG from 07/2008 reviewed.  Fibromyalgia: She reports chronic back pain and muscle stiffness. She is using Lidoderm patches, Gabapentin and Methocarbamol as prescribed. She does not routinely exercise.  GERD: She denies breakthrough on Omeprazole. Upper GI from 02/2010 reviewed.  HLD: Her last LDL was 74,10/2017. She denies myalgias on Atorvastatin. She is taking Fish Oil as well. She tries to consume a low fat diet.   Osteopenia: She is taking Calcium and VIt D OTC. Bone density from 07/2013 reviewed. She tries to get some weight bearing exercise in daily.  Insomnia: Currently not an issue.  Iron Deficiency Anemia: Her last H/H was 12.6/39.1, 07/2017. She is taking an iron supplement OTC. She denies issues with constipation. She denies s/s of bleeding.   DM 2: Her last A1C was 6.3 %, 07/2017. She is not taking any oral diabetic medication at this time. She does not check her sugars. She checks her feet routinely.  Flu: 01/2017 Tetanus: 02/2016 Pneumovax: never Pap Smear: 04/2014 Mammogram: 05/2018 Colonoscopy: 07/2015, 5 years Vision Screening: annually, Dr. Alois Cliche Dentist:  [facility]     Exercise: She consumes mostly lean meat. She consumes more veggies than fruits. She does eat some fried food. She drinks mostly green tea, water, sweet tea Exercise: Walking   Review of Systems      Past Medical History:  Diagnosis Date  . Allergy    seasonal  . Arthritis   . Diabetes mellitus without complication ([place])   . Fibromyalgia    dr. Marveen Reeks  . GERD  (gastroesophageal reflux disease)   . Hyperlipidemia   . Hypertension   . Wheezes    with season changes     Current Outpatient Medications  Medication Sig Dispense Refill  . albuterol (VENTOLIN HFA) 108 (90 Base) MCG/ACT inhaler INHALE 2 PUFFS INTO THE LUNGS EVERY 4 (FOUR) HOURS AS NEEDED FOR WHEEZING. 18 Inhaler 3  . amLODipine (NORVASC) 10 MG tablet TAKE 1 TABLET BY MOUTH EVERY DAY 30 tablet 1  . atorvastatin (LIPITOR) 10 MG tablet TAKE 1 TABLET BY MOUTH DAILY. **MUST SCHEDULE PHYSICAL EXAM.** 30 tablet 1  . calcium carbonate (OS-CAL) 600 MG TABS Take 600 mg by mouth 2 (two) times daily with a meal.      . desonide (DESOWEN) 0.05 % ointment as needed.     . ferrous sulfate 325 (65 FE) MG tablet Take 325 mg by mouth daily with breakfast.    . fexofenadine (ALLEGRA) 30 MG tablet Take 30 mg by mouth 2 (two) times daily.    . fish oil-omega-3 fatty acids 1000 MG capsule Take 2 g by mouth daily.    Marland Kitchen gabapentin (NEURONTIN) 100 MG capsule TAKE 2 CAPSULES BY MOUTH 3 TIMES A DAY 180 capsule 2  . Green Tea, Camillia sinensis, (GREEN TEA PO) Take by mouth.      . latanoprost (XALATAN) 0.005 % ophthalmic solution     . lidocaine (LIDODERM) 5 % Place 1 patch onto the skin daily. Remove & Discard patch within 12 hours or as  directed by MD (Patient taking differently: Place 1 patch onto the skin as needed. Remove & Discard patch within 12 hours or as directed by MD) 30 patch 2  . lisinopril (ZESTRIL) 20 MG tablet TAKE 1 TABLET (20 MG TOTAL) BY MOUTH AT BEDTIME. SCHEDULE PHYSICAL FOR MAY 2020 30 tablet 1  . methocarbamol (ROBAXIN) 500 MG tablet TAKE 1 TABLET BY MOUTH TWICE A DAY 60 tablet 2  . Multiple Vitamin (MULTIVITAMIN) tablet Take 1 tablet by mouth daily.    Marland Kitchen omeprazole (PRILOSEC) 40 MG capsule TAKE 1 CAPSULE BY MOUTH EVERY DAY 30 capsule 1  . timolol (TIMOPTIC) 0.5 % ophthalmic solution      No current facility-administered medications for this visit.     No Known Allergies  Family  History  Problem Relation Age of Onset  . Hypertension Mother   . Diabetes Sister   . Cancer Maternal Grandmother        OVARIAN  . Ovarian cancer Maternal Grandmother   . Colon cancer Neg Hx   . Colon polyps Neg Hx     Social History   Socioeconomic History  . Marital status: Married    Spouse name: Not on file  . Number of children: Not on file  . Years of education: Not on file  . Highest education level: Not on file  Occupational History  . Not on file  Social Needs  . Financial resource strain: Not on file  . Food insecurity    Worry: Not on file    Inability: Not on file  . Transportation needs    Medical: Not on file    Non-medical: Not on file  Tobacco Use  . Smoking status: Never Smoker  . Smokeless tobacco: Never Used  Substance and Sexual Activity  . Alcohol use: No    Alcohol/week: 0.0 standard drinks  . Drug use: No  . Sexual activity: Yes    Partners: Male    Birth control/protection: Surgical    Comment: TUBAL LIGATION, 1st sexual encounter at 20, has had 5   Lifestyle  . Physical activity    Days per week: Not on file    Minutes per session: Not on file  . Stress: Not on file  Relationships  . Social Herbalist on phone: Not on file    Gets together: Not on file    Attends religious service: Not on file    Active member of club or organization: Not on file    Attends meetings of clubs or organizations: Not on file    Relationship status: Not on file  . Intimate partner violence    Fear of current or ex partner: Not on file    Emotionally abused: Not on file    Physically abused: Not on file    Forced sexual activity: Not on file  Other Topics Concern  . Not on file  Social History Narrative  . Not on file     Constitutional: Denies fever, malaise, fatigue, headache or abrupt weight changes.  HEENT: Denies eye pain, eye redness, ear pain, ringing in the ears, wax buildup, runny nose, nasal congestion, bloody nose, or sore  throat. Respiratory: Denies difficulty breathing, shortness of breath, cough or sputum production.   Cardiovascular: Denies chest pain, chest tightness, palpitations or swelling in the hands or feet.  Gastrointestinal: Denies abdominal pain, bloating, constipation, diarrhea or blood in the stool.  GU: Pt reports urge incontinence. Denies frequency, pain with urination, burning sensation, blood  in urine, odor or discharge. Musculoskeletal: Pt reports chronic back pain, muscle pain. Denies decrease in range of motion, difficulty with gait, or joint  swelling.  Skin: Denies redness, rashes, lesions or ulcercations.  Neurological: Denies dizziness, difficulty with memory, difficulty with speech or problems with balance and coordination.  Psych: Denies anxiety, depression, SI/HI.  No other specific complaints in a complete review of systems (except as listed in HPI above).  Objective:   Physical Exam   BP 132/90 (BP Location: Left Arm, Patient Position: Sitting, Cuff Size: Normal) Comment: 127/84 at home via automatic cuff  Pulse 62   Temp 98.2 F (36.8 C) (Temporal)   Ht 5' (1.524 m)   Wt 173 lb (78.5 kg)   LMP 02/24/2008   SpO2 98%   BMI 33.79 kg/m  Wt Readings from Last 3 Encounters:  10/27/18 173 lb (78.5 kg)  07/05/18 172 lb 3.2 oz (78.1 kg)  12/29/17 175 lb 12.8 oz (79.7 kg)    General: Appears her stated age, obese, in NAD. Skin: Warm, dry and intact. No ulcerations noted. HEENT: Head: normal shape and size; Eyes: sclera white, no icterus, conjunctiva pink, PERRLA and EOMs intact; Ears: Tm's gray and intact, normal light reflex;  Neck:  Neck supple, trachea midline. No masses, lumps or thyromegaly present.  Cardiovascular: Normal rate and rhythm. S1,S2 noted.  No murmur, rubs or gallops noted. No JVD or BLE edema. No carotid bruits noted. Pulmonary/Chest: Normal effort and positive vesicular breath sounds. No respiratory distress. No wheezes, rales or ronchi noted.  Abdomen:  Soft and nontender. Normal bowel sounds. No distention or masses noted. Liver, spleen and kidneys non palpable. Pelvic: Normal female anatomy. Cervix not visualized. No appreciable vaginal discharge noted in the vault. No CMT. Adnexa non palpable. Musculoskeletal: Strength 5/5 BUE/BLE. No difficulty with gait.  Neurological: Alert and oriented. Cranial nerves II-XII grossly intact. Coordination normal.  Psychiatric: Mood and affect normal. Behavior is normal. Judgment and thought content normal.     BMET    Component Value Date/Time   NA 143 10/27/2018 1105   K 4.8 10/27/2018 1105   CL 106 10/27/2018 1105   CO2 24 10/27/2018 1105   GLUCOSE 96 10/27/2018 1105   GLUCOSE 109 (H) 01/29/2017 0840   BUN 10 10/27/2018 1105   CREATININE 0.62 10/27/2018 1105   CREATININE 0.66 04/24/2014 0859   CALCIUM 9.9 10/27/2018 1105   GFRNONAA 100 10/27/2018 1105   GFRAA 115 10/27/2018 1105    Lipid Panel     Component Value Date/Time   CHOL 165 10/27/2018 1105   TRIG 59 10/27/2018 1105   HDL 67 10/27/2018 1105   CHOLHDL 2.5 10/27/2018 1105   CHOLHDL 3 03/03/2016 0912   VLDL 14.2 03/03/2016 0912   LDLCALC 86 10/27/2018 1105    CBC    Component Value Date/Time   WBC 6.0 10/27/2018 1105   WBC 4.9 01/29/2017 0840   RBC 4.49 10/27/2018 1105   RBC 4.56 01/29/2017 0840   HGB 12.6 10/27/2018 1105   HCT 38.0 10/27/2018 1105   PLT 333 10/27/2018 1105   MCV 85 10/27/2018 1105   MCH 28.1 10/27/2018 1105   MCH 27.8 04/24/2014 0859   MCHC 33.2 10/27/2018 1105   MCHC 32.1 01/29/2017 0840   RDW 13.8 10/27/2018 1105   LYMPHSABS 3.5 (H) 07/02/2016 0943   MONOABS 0.5 04/24/2014 0859   EOSABS 0.1 07/02/2016 0943   BASOSABS 0.0 07/02/2016 0943    Hgb A1C Lab Results  Component Value Date  HGBA1C 6.3 (H) 10/27/2018           Assessment & Plan:   Preventative Health Maintenance:  Encouraged her to get a flu shot in the fall Tetanus UTD She declines pneumovax Pap smear done today-  she declines STD screening Mammogram UTD Colon screening UTD Encouraged her to consume a balanced diet and exercise regimen Advised her to see an eye doctor and dentist annually Will check CBC, CMET, Lipid, A1C and Vit D today  RTC in 6 months to follow up chronic conditions Webb Silversmith, NP

## 2018-10-27 NOTE — Patient Instructions (Signed)

## 2018-10-28 LAB — COMPREHENSIVE METABOLIC PANEL
ALT: 14 IU/L (ref 0–32)
AST: 22 IU/L (ref 0–40)
Albumin/Globulin Ratio: 2.1 (ref 1.2–2.2)
Albumin: 4.7 g/dL (ref 3.8–4.9)
Alkaline Phosphatase: 86 IU/L (ref 39–117)
BUN/Creatinine Ratio: 16 (ref 9–23)
BUN: 10 mg/dL (ref 6–24)
Bilirubin Total: 0.5 mg/dL (ref 0.0–1.2)
CO2: 24 mmol/L (ref 20–29)
Calcium: 9.9 mg/dL (ref 8.7–10.2)
Chloride: 106 mmol/L (ref 96–106)
Creatinine, Ser: 0.62 mg/dL (ref 0.57–1.00)
GFR calc Af Amer: 115 mL/min/{1.73_m2} (ref >59)
GFR calc non Af Amer: 100 mL/min/{1.73_m2} (ref >59)
Globulin, Total: 2.2 g/dL (ref 1.5–4.5)
Glucose: 96 mg/dL (ref 65–99)
Potassium: 4.8 mmol/L (ref 3.5–5.2)
Sodium: 143 mmol/L (ref 134–144)
Total Protein: 6.9 g/dL (ref 6.0–8.5)

## 2018-10-28 LAB — CBC
Hematocrit: 38 % (ref 34.0–46.6)
Hemoglobin: 12.6 g/dL (ref 11.1–15.9)
MCH: 28.1 pg (ref 26.6–33.0)
MCHC: 33.2 g/dL (ref 31.5–35.7)
MCV: 85 fL (ref 79–97)
Platelets: 333 10*3/uL (ref 150–450)
RBC: 4.49 x10E6/uL (ref 3.77–5.28)
RDW: 13.8 % (ref 11.7–15.4)
WBC: 6 10*3/uL (ref 3.4–10.8)

## 2018-10-28 LAB — LIPID PANEL
Chol/HDL Ratio: 2.5 ratio (ref 0.0–4.4)
Cholesterol, Total: 165 mg/dL (ref 100–199)
HDL: 67 mg/dL (ref >39)
LDL Calculated: 86 mg/dL (ref 0–99)
Triglycerides: 59 mg/dL (ref 0–149)
VLDL Cholesterol Cal: 12 mg/dL (ref 5–40)

## 2018-10-28 LAB — HEMOGLOBIN A1C
Est. average glucose Bld gHb Est-mCnc: 134 mg/dL
Hgb A1c MFr Bld: 6.3 % — ABNORMAL HIGH (ref 4.8–5.6)

## 2018-10-28 LAB — VITAMIN D 25 HYDROXY (VIT D DEFICIENCY, FRACTURES): Vit D, 25-Hydroxy: 39.9 ng/mL (ref 30.0–100.0)

## 2018-10-29 ENCOUNTER — Other Ambulatory Visit: Payer: Self-pay | Admitting: Internal Medicine

## 2018-10-30 DIAGNOSIS — M1990 Unspecified osteoarthritis, unspecified site: Secondary | ICD-10-CM | POA: Insufficient documentation

## 2018-10-30 DIAGNOSIS — D509 Iron deficiency anemia, unspecified: Secondary | ICD-10-CM | POA: Insufficient documentation

## 2018-10-30 NOTE — Assessment & Plan Note (Signed)
CBC today Continue iron supplement OTC

## 2018-10-30 NOTE — Assessment & Plan Note (Signed)
Currently not an issue Will monitor 

## 2018-10-30 NOTE — Assessment & Plan Note (Signed)
CMET and lipid profile today Encouraged him to consume a low fat diet Continue Atorvastatin and Fish Oil for now

## 2018-10-30 NOTE — Assessment & Plan Note (Signed)
Continue Lidoderm, Gabapentin and Methocarbamol Encouraged regular physical activity, stretching

## 2018-10-30 NOTE — Assessment & Plan Note (Signed)
Continue Amlodipine and Lisinopril CMET today Reinforced DASH diet and exercise for weight loss

## 2018-10-30 NOTE — Assessment & Plan Note (Signed)
Continue Lidoderm, Gabapentin and Methocarbamol Encouraged regular physical activity and regular strenthening

## 2018-10-30 NOTE — Assessment & Plan Note (Signed)
A1C today No microalbumin secondary to ACEI therapy Encouraged her to consume a low carb diet and exercise for weight loss Foot exam today Encouraged routine eye exams She declines pneumovax today

## 2018-10-30 NOTE — Assessment & Plan Note (Signed)
Discussed how avoiding foods and weight loss could help reduce reflux CBC and CMET today Continue Omeprazole for now

## 2018-10-30 NOTE — Assessment & Plan Note (Signed)
Vit d today Encouraged daily weight bearing exercise Continue Calcium and Vit D OTC

## 2018-11-02 LAB — PAP LB (LIQUID-BASED)

## 2018-12-09 ENCOUNTER — Other Ambulatory Visit: Payer: Self-pay | Admitting: Internal Medicine

## 2018-12-17 ENCOUNTER — Other Ambulatory Visit: Payer: Self-pay | Admitting: Internal Medicine

## 2018-12-22 NOTE — Progress Notes (Deleted)
Office Visit Note  Patient: Courtney Guzman             Date of Birth: [birth year]           MRN: [id number]             PCP: Jearld Fenton, NP Referring: Jearld Fenton, NP Visit Date: 01/05/2019 Occupation: @GUAROCC @  Subjective:  No chief complaint on file.   History of Present Illness: Courtney Guzman is a 58 y.o. female ***   Activities of Daily Living:  Patient reports morning stiffness for *** {minute/hour:19697}.   Patient {ACTIONS;DENIES/REPORTS:21021675::"Denies"} nocturnal pain.  Difficulty dressing/grooming: {ACTIONS;DENIES/REPORTS:21021675::"Denies"} Difficulty climbing stairs: {ACTIONS;DENIES/REPORTS:21021675::"Denies"} Difficulty getting out of chair: {ACTIONS;DENIES/REPORTS:21021675::"Denies"} Difficulty using hands for taps, buttons, cutlery, and/or writing: {ACTIONS;DENIES/REPORTS:21021675::"Denies"}  No Rheumatology ROS completed.   PMFS History:  Patient Active Problem List   Diagnosis Date Noted  . Osteoarthritis 10/30/2018  . Iron deficiency anemia 10/30/2018  . Essential hypertension 08/11/2016  . Primary insomnia 07/01/2016  . 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 Seasonal allergic rhinitis due to pollen 03/03/2016  . Type 2 diabetes mellitus without complication, without long-term current use of insulin ([place]) 06/14/2015  . Hyperlipidemia 05/10/2014  . Family history of ovarian cancer 04/08/2012  . Osteopenia 03/22/2012  . GERD (gastroesophageal reflux disease)   . Fibromyalgia syndrome 07/26/2008    Past Medical History:  Diagnosis Date  . Allergy    seasonal  . Arthritis   . Diabetes mellitus without complication ([place])   . Fibromyalgia    dr. Marveen Reeks  . GERD (gastroesophageal reflux disease)   . Hyperlipidemia   . Hypertension   . Wheezes    with season changes     Family History  Problem Relation Age of Onset  . Hypertension Mother   . Diabetes Sister   . Cancer Maternal Grandmother        OVARIAN  . Ovarian cancer Maternal  Grandmother   . Colon cancer Neg Hx   . Colon polyps Neg Hx    Past Surgical History:  Procedure Laterality Date  . [place]   with BTL  . COLONOSCOPY    . COLONOSCOPY W/ POLYPECTOMY  2006   BENIGN ADENOMA   . POLYPECTOMY    . TONSILLECTOMY AND ADENOIDECTOMY     Social History   Social History Narrative  . Not on file   Immunization History  Administered Date(s) Administered  . Influenza,inj,Quad PF,6+ Mos 01/18/2014, 02/19/2016  . Influenza-Unspecified 01/27/2017  . PPD Test 03/17/2011  . Tdap 04/11/2013, 03/08/2016     Objective: Vital Signs: LMP 02/24/2008    Physical Exam   Musculoskeletal Exam: ***  CDAI Exam: CDAI Score: - Patient Global: -; Provider Global: - Swollen: -; Tender: - Joint Exam   No joint exam has been documented for this visit   There is currently no information documented on the homunculus. Go to the Rheumatology activity and complete the homunculus joint exam.  Investigation: No additional findings.  Imaging: No results found.  Recent Labs: Lab Results  Component Value Date   WBC 6.0 10/27/2018   HGB 12.6 10/27/2018   PLT 333 10/27/2018   NA 143 10/27/2018   K 4.8 10/27/2018   CL 106 10/27/2018   CO2 24 10/27/2018   GLUCOSE 96 10/27/2018   BUN 10 10/27/2018   CREATININE 0.62 10/27/2018   BILITOT 0.5 10/27/2018   ALKPHOS 86 10/27/2018   AST 22 10/27/2018   ALT 14 10/27/2018   PROT 6.9 10/27/2018  ALBUMIN 4.7 10/27/2018   CALCIUM 9.9 10/27/2018   GFRAA 115 10/27/2018    Speciality Comments: No specialty comments available.  Procedures:  No procedures performed Allergies: Patient has no known allergies.   Assessment / Plan:     Visit Diagnoses: No diagnosis found.  Orders: No orders of the defined types were placed in this encounter.  No orders of the defined types were placed in this encounter.   Face-to-face time spent with patient was *** minutes. Greater than 50% of time was spent in  counseling and coordination of care.  Follow-Up Instructions: No follow-ups on file.   Earnestine Mealing, CMA  Note - This record has been created using Editor, commissioning.  Chart creation errors have been sought, but may not always  have been located. Such creation errors do not reflect on  the standard of medical care.

## 2018-12-28 ENCOUNTER — Other Ambulatory Visit: Payer: Self-pay | Admitting: Internal Medicine

## 2018-12-28 DIAGNOSIS — J4520 Mild intermittent asthma, uncomplicated: Secondary | ICD-10-CM

## 2019-01-04 NOTE — Progress Notes (Signed)
Office Visit Note  Patient: Courtney Guzman             Date of Birth: 1960/12/22           MRN: [id number]             PCP: Jearld Fenton, NP Referring: Jearld Fenton, NP Visit Date: 01/10/2019 Occupation: @GUAROCC @  Subjective:  Generalized pain   History of Present Illness: Courtney Guzman is a 58 y.o. female with history of fibromyalgia.  Patient is taking gabapentin 100 mg 2 capsules by mouth 3 times daily and Robaxin 500 mg 1 tablet by mouth twice daily for muscle spasms.  She uses Lidoderm patches as needed.  She is having trapezius muscle tension and tenderness.  She cannot have trigger point injections with cortisone due to history of glaucoma.  She states her husband will occasionally give her massages but she has not been to a massage therapist recently.  She has tried using Lidoderm patches which provide minimal relief.  She is having trochanter bursitis bilaterally.  She perform stretching exercises regularly.  She has been trying to walk for exercise on a regular basis.  She states that her [employer] membership is currently on hold due to the COVID-19 pandemic.  She would like to start going back to the pool for water therapy once she reactivates her membership.  She says she has been sleeping about 6 and half to 7 hours per night.  She states that she has intermittent fatigue.  She rates her pain a 8 out of 10 currently.  She states she has tried taking Aleve with minimal relief.  She denies any joint pain or joint swelling at this time.  Activities of Daily Living:  Patient reports morning stiffness for 10 minutes.   Patient Reports nocturnal pain.  Difficulty dressing/grooming: Denies Difficulty climbing stairs: Denies Difficulty getting out of chair: Denies Difficulty using hands for taps, buttons, cutlery, and/or writing: Denies  Review of Systems  Constitutional: Positive for fatigue.  HENT: Negative for mouth sores, mouth dryness and nose dryness.   Eyes: Negative for itching  and dryness.  Respiratory: Negative for shortness of breath, wheezing and difficulty breathing.   Cardiovascular: Negative for chest pain and palpitations.  Gastrointestinal: Negative for abdominal pain, blood in stool, constipation and diarrhea.  Endocrine: Negative for increased urination.  Genitourinary: Negative for difficulty urinating and painful urination.  Musculoskeletal: Positive for arthralgias, joint pain and morning stiffness. Negative for joint swelling.  Skin: Negative for rash.  Allergic/Immunologic: Negative for susceptible to infections.  Neurological: Positive for memory loss. Negative for dizziness, light-headedness, numbness, headaches and weakness.  Hematological: Positive for bruising/bleeding tendency.  Psychiatric/Behavioral: Positive for sleep disturbance. Negative for depressed mood and confusion. The patient is not nervous/anxious.     PMFS History:  Patient Active Problem List   Diagnosis Date Noted  . Osteoarthritis 10/30/2018  . Iron deficiency anemia 10/30/2018  . Essential hypertension 08/11/2016  . Primary insomnia 07/01/2016  . 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 Seasonal allergic rhinitis due to pollen 03/03/2016  . Type 2 diabetes mellitus without complication, without long-term current use of insulin ([place]) 06/14/2015  . Hyperlipidemia 05/10/2014  . Family history of ovarian cancer 04/08/2012  . Osteopenia 03/22/2012  . GERD (gastroesophageal reflux disease)   . Fibromyalgia syndrome 07/26/2008    Past Medical History:  Diagnosis Date  . Allergy    seasonal  . Arthritis   . Diabetes mellitus without complication ([place])   . Fibromyalgia  dr. Marveen Reeks  . GERD (gastroesophageal reflux disease)   . Hyperlipidemia   . Hypertension   . Wheezes    with season changes     Family History  Problem Relation Age of Onset  . Hypertension Mother   . Diabetes Sister   . Cancer Maternal Grandmother        OVARIAN  . Ovarian cancer  Maternal Grandmother   . Colon cancer Neg Hx   . Colon polyps Neg Hx    Past Surgical History:  Procedure Laterality Date  . [place]   with BTL  . COLONOSCOPY    . COLONOSCOPY W/ POLYPECTOMY  2006   BENIGN ADENOMA   . POLYPECTOMY    . TONSILLECTOMY AND ADENOIDECTOMY     Social History   Social History Narrative  . Not on file   Immunization History  Administered Date(s) Administered  . Influenza,inj,Quad PF,6+ Mos 01/18/2014, 02/19/2016  . Influenza-Unspecified 01/27/2017  . PPD Test 03/17/2011  . Tdap 04/11/2013, 03/08/2016     Objective: Vital Signs: BP (!) 125/93 (BP Location: Right Arm, Patient Position: Sitting, Cuff Size: Normal)   Pulse 71   Resp 13   Ht 5' (1.524 m)   Wt 175 lb (79.4 kg)   LMP 02/24/2008   BMI 34.18 kg/m    Physical Exam Vitals signs and nursing note reviewed.  Constitutional:      Appearance: She is well-developed.  HENT:     Head: Normocephalic and atraumatic.  Eyes:     Conjunctiva/sclera: Conjunctivae normal.  Neck:     Musculoskeletal: Normal range of motion.  Cardiovascular:     Rate and Rhythm: Normal rate and regular rhythm.     Heart sounds: Normal heart sounds.  Pulmonary:     Effort: Pulmonary effort is normal.     Breath sounds: Normal breath sounds.  Abdominal:     General: Bowel sounds are normal.     Palpations: Abdomen is soft.  Lymphadenopathy:     Cervical: No cervical adenopathy.  Skin:    General: Skin is warm and dry.     Capillary Refill: Capillary refill takes less than 2 seconds.  Neurological:     Mental Status: She is alert and oriented to person, place, and time.  Psychiatric:        Behavior: Behavior normal.      Musculoskeletal Exam: C-spine, thoracic spine, lumbar spine good range of motion.  No midline spinal tenderness.  No SI joint tenderness.  Trapezius muscle tension and tenderness. Shoulder joints, elbow joints, wrist joints, MCPs, PIPs, DIPs good range of motion no  synovitis.  She has complete fist formation bilaterally.  Hip joints, knee joints, ankle joints, MTPs, PIPs, DIPs good range of motion with no synovitis.  No warmth or effusion of bilateral knee joints.  No tenderness or swelling of ankle joints.  She has tenderness over bilateral trochanteric bursa.  CDAI Exam: CDAI Score: - Patient Global: -; Provider Global: - Swollen: -; Tender: - Joint Exam   No joint exam has been documented for this visit   There is currently no information documented on the homunculus. Go to the Rheumatology activity and complete the homunculus joint exam.  Investigation: No additional findings.  Imaging: No results found.  Recent Labs: Lab Results  Component Value Date   WBC 6.0 10/27/2018   HGB 12.6 10/27/2018   PLT 333 10/27/2018   NA 143 10/27/2018   K 4.8 10/27/2018   CL 106 10/27/2018  CO2 24 10/27/2018   GLUCOSE 96 10/27/2018   BUN 10 10/27/2018   CREATININE 0.62 10/27/2018   BILITOT 0.5 10/27/2018   ALKPHOS 86 10/27/2018   AST 22 10/27/2018   ALT 14 10/27/2018   PROT 6.9 10/27/2018   ALBUMIN 4.7 10/27/2018   CALCIUM 9.9 10/27/2018   GFRAA 115 10/27/2018    Speciality Comments: No specialty comments available.  Procedures:  Trigger Point Inj  Date/Time: 01/10/2019 9:59 AM Performed by: Ofilia Neas, PA-C Authorized by: Ofilia Neas, PA-C   Consent Given by:  Patient Site marked: the procedure site was marked   Timeout: prior to procedure the correct patient, procedure, and site was verified   Indications:  Pain Total # of Trigger Points:  2 Location: neck   Needle Size:  27 G Approach:  Dorsal Medications #1:  0.5 mL lidocaine 1 % Medications #2:  0.5 mL lidocaine 1 % Patient tolerance:  Patient tolerated the procedure well with no immediate complications   Allergies: Patient has no known allergies.   Assessment / Plan:     Visit Diagnoses: Fibromyalgia syndrome: She has generalized hyperalgesia and positive tender  points on exam.  She is been having generalized muscle aches and muscle tenderness due to fibromyalgia.  She is been having more severe and frequent fibromyalgia flares.  She is trapezius muscle tension and muscle tenderness bilaterally.  Lighted cane trigger point injections were performed bilaterally.  She tolerated the procedure well.  She is having trochanter bursitis bilaterally.  We discussed the importance of stretching on a regular basis.  She has chronic fatigue related to insomnia.  She has been sleeping 6 to 7 hours per night.  Good sleep hygiene was discussed.  She continues to take gabapentin 200 mg 3 capsules by mouth daily and Robaxin 500 mg twice daily as needed for muscle spasms.  She uses Lidoderm patches as needed.  She does not need any refills of these medications at this time.  We discussed the importance of water exercise/water therapy.  Her [employer] membership is currently on hold but she is consider reactivating the membership.  She will follow-up in the office in 3 months.  Primary insomnia: She sleeps about 6.5-7 hours per night.  Good sleep hygiene was discussed.   Other fatigue: She has chronic fatigue but reports it has been stable overall.  She was encouraged to stay active and exercise on a regular basis.   Trapezius muscle spasm: She has trapezius muscle tension and muscle tenderness bilaterally.  She been experiencing muscle spasms intermittently.  She has had trigger point injections in the past which provided significant relief.  She cannot have cortisone due to her history of glaucoma.  Lidocaine trigger point injections were performed today.  We discussed scheduling a massage/myofascial release.  She has been using a heating pad as needed.  She also uses Lidoderm patches as needed.  She does not need a refill at this time.  Other medical conditions are listed as follows:   Osteopenia of multiple sites  History of bilateral carpal tunnel release  Type 2 diabetes mellitus  without complication, without long-term current use of insulin ([facility])  Raised intraocular pressure, unspecified laterality  History of gastroesophageal reflux (GERD)  Essential hypertension  History of hyperlipidemia  Orders: Orders Placed This Encounter  Procedures  . Trigger Point Inj   No orders of the defined types were placed in this encounter.   Face-to-face time spent with patient was 30 minutes. Greater than 50%  of time was spent in counseling and coordination of care.  Follow-Up Instructions: Return in about 3 months (around 04/11/2019) for Fibromyalgia.   Ofilia Neas, PA-C  Note - This record has been created using Dragon software.  Chart creation errors have been sought, but may not always  have been located. Such creation errors do not reflect on  the standard of medical care.

## 2019-01-05 ENCOUNTER — Ambulatory Visit: Payer: 59 | Admitting: Rheumatology

## 2019-01-05 ENCOUNTER — Ambulatory Visit: Payer: Self-pay | Admitting: Physician Assistant

## 2019-01-09 ENCOUNTER — Other Ambulatory Visit: Payer: Self-pay | Admitting: Rheumatology

## 2019-01-10 ENCOUNTER — Other Ambulatory Visit: Payer: Self-pay

## 2019-01-10 ENCOUNTER — Encounter: Payer: Self-pay | Admitting: Physician Assistant

## 2019-01-10 ENCOUNTER — Ambulatory Visit: Payer: 59 | Admitting: Physician Assistant

## 2019-01-10 VITALS — BP 125/93 | HR 71 | Resp 13 | Ht 60.0 in | Wt 175.0 lb

## 2019-01-10 DIAGNOSIS — M797 Fibromyalgia: Secondary | ICD-10-CM

## 2019-01-10 DIAGNOSIS — R5383 Other fatigue: Secondary | ICD-10-CM | POA: Diagnosis not present

## 2019-01-10 DIAGNOSIS — M8589 Other specified disorders of bone density and structure, multiple sites: Secondary | ICD-10-CM

## 2019-01-10 DIAGNOSIS — Z8719 Personal history of other diseases of the digestive system: Secondary | ICD-10-CM

## 2019-01-10 DIAGNOSIS — I10 Essential (primary) hypertension: Secondary | ICD-10-CM

## 2019-01-10 DIAGNOSIS — Z8639 Personal history of other endocrine, nutritional and metabolic disease: Secondary | ICD-10-CM

## 2019-01-10 DIAGNOSIS — E119 Type 2 diabetes mellitus without complications: Secondary | ICD-10-CM

## 2019-01-10 DIAGNOSIS — H40059 Ocular hypertension, unspecified eye: Secondary | ICD-10-CM

## 2019-01-10 DIAGNOSIS — F5101 Primary insomnia: Secondary | ICD-10-CM

## 2019-01-10 DIAGNOSIS — M62838 Other muscle spasm: Secondary | ICD-10-CM | POA: Diagnosis not present

## 2019-01-10 DIAGNOSIS — Z9889 Other specified postprocedural states: Secondary | ICD-10-CM

## 2019-01-10 MED ORDER — LIDOCAINE HCL 1 % IJ SOLN
0.5000 mL | INTRAMUSCULAR | Status: AC | PRN
  Administered 2019-01-10: .5 mL

## 2019-01-10 NOTE — Telephone Encounter (Signed)
Last Visit: 07/05/18 Next visit: 01/10/19  Okay to refill per Dr. Estanislado Pandy

## 2019-02-09 ENCOUNTER — Telehealth: Payer: Self-pay

## 2019-02-09 MED ORDER — LISINOPRIL 20 MG PO TABS: 20.0000 mg | ORAL_TABLET | Freq: Every day | ORAL | 1 refills | Status: DC

## 2019-02-09 MED ORDER — OMEPRAZOLE 40 MG PO CPDR: DELAYED_RELEASE_CAPSULE | ORAL | 1 refills | Status: DC

## 2019-02-09 NOTE — Telephone Encounter (Signed)
Pt left v/m that pts insurance will pay for 3 month supplies on pts med instead of monthly refills and pt request cb about getting 3 month supplies.

## 2019-02-09 NOTE — Telephone Encounter (Signed)
Rx sent through e-scribe  

## 2019-04-09 ENCOUNTER — Other Ambulatory Visit: Payer: Self-pay | Admitting: Rheumatology

## 2019-04-10 NOTE — Telephone Encounter (Signed)
Last Visit: 01/10/2019 Next Visit: 06/13/2019  Okay to refill per Dr. Estanislado Pandy.

## 2019-04-11 ENCOUNTER — Ambulatory Visit: Payer: 59 | Admitting: Rheumatology

## 2019-04-24 ENCOUNTER — Other Ambulatory Visit: Payer: Self-pay | Admitting: Internal Medicine

## 2019-04-24 DIAGNOSIS — Z1231 Encounter for screening mammogram for malignant neoplasm of breast: Secondary | ICD-10-CM

## 2019-05-24 ENCOUNTER — Other Ambulatory Visit: Payer: Self-pay | Admitting: Internal Medicine

## 2019-06-07 NOTE — Progress Notes (Signed)
Office Visit Note  Patient: Courtney Guzman             Date of Birth: July 04, 1960           MRN: [id number]             PCP: Jearld Fenton, NP Referring: Jearld Fenton, NP Visit Date: 06/13/2019 Occupation: @GUAROCC @  Subjective:  Trapezius muscle tension   History of Present Illness: Courtney Guzman is a 59 y.o. female with history of fibromyalgia and osteopenia.  Patient reports that she has been experiencing increased trapezius muscle tension and muscle tenderness bilaterally.  She states that she has tried using topical Voltaren gel as well as a heating pad without much relief.  She states the trigger point injections have not been very effective in the past.  She denies any neck pain at this time.  She continues to take Robaxin 500 mg 1 tablet twice daily as needed for muscle spasms and gabapentin as prescribed.  She is unsure if gabapentin is not as effective as it used to be.  She denies any joint pain or joint swelling at this time.  She states that she has been more sedentary recently but plans on starting to exercise on a daily basis once the weather warms up.  Activities of Daily Living:  Patient reports morning stiffness for 3 minutes.   Patient Reports nocturnal pain.  Difficulty dressing/grooming: Denies Difficulty climbing stairs: Denies Difficulty getting out of chair: Denies Difficulty using hands for taps, buttons, cutlery, and/or writing: Denies  Review of Systems  Constitutional: Positive for fatigue.  HENT: Negative for mouth sores, mouth dryness and nose dryness.   Eyes: Negative for pain, visual disturbance and dryness.  Respiratory: Negative for cough, hemoptysis, shortness of breath and difficulty breathing.   Cardiovascular: Negative for chest pain, palpitations, hypertension and swelling in legs/feet.  Gastrointestinal: Negative for blood in stool, constipation and diarrhea.  Endocrine: Negative for excessive thirst and increased urination.  Genitourinary:  Negative for difficulty urinating and painful urination.  Musculoskeletal: Positive for arthralgias, joint pain, muscle weakness, morning stiffness and muscle tenderness. Negative for joint swelling, myalgias and myalgias.  Skin: Negative for color change, pallor, rash, hair loss, nodules/bumps, skin tightness, ulcers and sensitivity to sunlight.  Allergic/Immunologic: Negative for susceptible to infections.  Neurological: Negative for dizziness, headaches and weakness.  Hematological: Negative for bruising/bleeding tendency and swollen glands.  Psychiatric/Behavioral: Positive for sleep disturbance. Negative for depressed mood. The patient is not nervous/anxious.     PMFS History:  Patient Active Problem List   Diagnosis Date Noted  . Osteoarthritis 10/30/2018  . Iron deficiency anemia 10/30/2018  . Essential hypertension 08/11/2016  . Primary insomnia 07/01/2016  . 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 Seasonal allergic rhinitis due to pollen 03/03/2016  . Type 2 diabetes mellitus without complication, without long-term current use of insulin ([place]) 06/14/2015  . Hyperlipidemia 05/10/2014  . Family history of ovarian cancer 04/08/2012  . Osteopenia 03/22/2012  . GERD (gastroesophageal reflux disease)   . Fibromyalgia syndrome 07/26/2008    Past Medical History:  Diagnosis Date  . Allergy    seasonal  . Arthritis   . Diabetes mellitus without complication ([place])   . Fibromyalgia    dr. Marveen Reeks  . GERD (gastroesophageal reflux disease)   . Hyperlipidemia   . Hypertension   . Wheezes    with season changes     Family History  Problem Relation Age of Onset  . Hypertension Mother   .  Diabetes Sister   . Cancer Maternal Grandmother        OVARIAN  . Ovarian cancer Maternal Grandmother   . Colon cancer Neg Hx   . Colon polyps Neg Hx    Past Surgical History:  Procedure Laterality Date  . [place]   with BTL  . COLONOSCOPY    . COLONOSCOPY W/  POLYPECTOMY  2006   BENIGN ADENOMA   . POLYPECTOMY    . TONSILLECTOMY AND ADENOIDECTOMY     Social History   Social History Narrative  . Not on file   Immunization History  Administered Date(s) Administered  . Influenza,inj,Quad PF,6+ Mos 01/18/2014, 02/19/2016  . Influenza-Unspecified 01/27/2017  . PPD Test 03/17/2011  . Tdap 04/11/2013, 03/08/2016     Objective: Vital Signs: BP 131/87 (BP Location: Left Arm, Patient Position: Sitting, Cuff Size: Normal)   Pulse 70   Resp 16   Ht 5' (1.524 m)   Wt 180 lb 6.4 oz (81.8 kg)   LMP 02/24/2008   BMI 35.23 kg/m    Physical Exam Vitals and nursing note reviewed.  Constitutional:      Appearance: She is well-developed.  HENT:     Head: Normocephalic and atraumatic.  Eyes:     Conjunctiva/sclera: Conjunctivae normal.  Cardiovascular:     Rate and Rhythm: Normal rate and regular rhythm.     Heart sounds: Normal heart sounds.  Pulmonary:     Effort: Pulmonary effort is normal.     Breath sounds: Normal breath sounds.  Abdominal:     General: Bowel sounds are normal.     Palpations: Abdomen is soft.  Musculoskeletal:     Cervical back: Normal range of motion.  Lymphadenopathy:     Cervical: No cervical adenopathy.  Skin:    General: Skin is warm and dry.     Capillary Refill: Capillary refill takes less than 2 seconds.  Neurological:     Mental Status: She is alert and oriented to person, place, and time.  Psychiatric:        Behavior: Behavior normal.      Musculoskeletal Exam: Generalized hyperalgesia and positive tender points.  Trapezius muscle tenderness and tension bilaterally. C-spine, thoracic spine, lumbar spine good range of motion.  No midline spinal tenderness.  No SI joint tenderness.  Shoulder joints, elbow joints, wrist joints, MCPs, PIPs and DIPs good range of motion with no synovitis.  She has complete fist formation bilaterally.  Hip joints have good range of motion with no discomfort.  No tenderness  over trochanteric bursa bilaterally.  Knee joints have good range of motion with no warmth or effusion.  No knee crepitus noted.  Ankle joints have good range of motion with no tenderness or inflammation.  No tenderness of MTP joints.  CDAI Exam: CDAI Score: -- Patient Global: --; Provider Global: -- Swollen: --; Tender: -- Joint Exam 06/13/2019   No joint exam has been documented for this visit   There is currently no information documented on the homunculus. Go to the Rheumatology activity and complete the homunculus joint exam.  Investigation: No additional findings.  Imaging: No results found.  Recent Labs: Lab Results  Component Value Date   WBC 6.0 10/27/2018   HGB 12.6 10/27/2018   PLT 333 10/27/2018   NA 143 10/27/2018   K 4.8 10/27/2018   CL 106 10/27/2018   CO2 24 10/27/2018   GLUCOSE 96 10/27/2018   BUN 10 10/27/2018   CREATININE 0.62 10/27/2018  BILITOT 0.5 10/27/2018   ALKPHOS 86 10/27/2018   AST 22 10/27/2018   ALT 14 10/27/2018   PROT 6.9 10/27/2018   ALBUMIN 4.7 10/27/2018   CALCIUM 9.9 10/27/2018   GFRAA 115 10/27/2018    Speciality Comments: No specialty comments available.  Procedures:  No procedures performed Allergies: Patient has no known allergies.   Assessment / Plan:     Visit Diagnoses: Fibromyalgia syndrome - She has generalized hyperalgesia and positive tender points on exam.  She experiences trapezius muscle tension and spasms 1-2 times per week.  She continues to take gabapentin 200 mg 3 capsules by mouth daily and Robaxin 500 mg twice daily as needed for muscle spasms.  We discussed neck exercises to perform.  We also discussed using warm moist heat to release muscle tension.  She declined trigger point injections since they have been ineffective in the past. We discussed that she can try dry needling or botox in the future. We discussed the benefits of massage. She is going to try to focus on maintaining appropriate posture while  working.  A refill of lidoderm patches were sent to the pharmacy. We discussed the importance of regular exercise and good sleep hygiene.  She will follow up in 6 months.  Primary insomnia: She is been experiencing interrupted pain at night due to the pain she experiences in her trapezius muscles bilaterally.  Good sleep hygiene was discussed.  Other fatigue: Chronic but stable.  We discussed the importance of regular exercise.  Trapezius muscle spasm: She has trapezius muscle tension and tenderness bilaterally.  She has been experiencing episodic muscle spasms 1-2 times per week.  She has tried heat and using topical agents without much relief.  Lidocaine trigger point injections were performed on 01/10/19, which provided temporary relief.  She is unable to receive cortisone due to elevated IOP.  We discussed the benefits of massage, dry needling, and botox.  We also discussed the importance of appropriate posture and performing neck exercises. She continues to take robaxin 500 mg BID PRN for muscle spasms.  A refill of lidoderm patches were sent to the pharmacy.    Osteopenia of multiple sites -DEXA on 07/31/13 AP spine BMD 0.839 with T-score -1.9.  She is been taking calcium and vitamin D supplement daily.  She has not had any recent falls or fractures.  She is due to update DEXA.  Future orders placed today.  Plan: DG BONE DENSITY (DXA)  History of bilateral carpal tunnel release: She is asymptomatic at this time.   Other medical conditions are listed as follows:   Raised intraocular pressure, unspecified laterality  Type 2 diabetes mellitus without complication, without long-term current use of insulin ([facility])  Essential hypertension  History of hyperlipidemia  History of gastroesophageal reflux (GERD)  Orders: Orders Placed This Encounter  Procedures  . DG BONE DENSITY (DXA)   Meds ordered this encounter  Medications  . lidocaine (LIDODERM) 5 %    Sig: Place 1 patch onto the skin  daily. Remove & Discard patch within 12 hours or as directed by MD    Dispense:  30 patch    Refill:  2    Face-to-face time spent with patient was 30 minutes. Greater than 50% of time was spent in counseling and coordination of care.  Follow-Up Instructions: Return in about 6 months (around 12/11/2019) for Fibromyalgia.   Ofilia Neas, PA-C  Note - This record has been created using Dragon software.  Chart creation errors have been  sought, but may not always  have been located. Such creation errors do not reflect on  the standard of medical care.

## 2019-06-09 ENCOUNTER — Ambulatory Visit: Payer: 59

## 2019-06-13 ENCOUNTER — Ambulatory Visit: Payer: 59 | Admitting: Physician Assistant

## 2019-06-13 ENCOUNTER — Telehealth: Payer: Self-pay | Admitting: *Deleted

## 2019-06-13 ENCOUNTER — Other Ambulatory Visit: Payer: Self-pay

## 2019-06-13 ENCOUNTER — Encounter: Payer: Self-pay | Admitting: Physician Assistant

## 2019-06-13 ENCOUNTER — Telehealth: Payer: Self-pay | Admitting: Rheumatology

## 2019-06-13 VITALS — BP 131/87 | HR 70 | Resp 16 | Ht 60.0 in | Wt 180.4 lb

## 2019-06-13 DIAGNOSIS — M62838 Other muscle spasm: Secondary | ICD-10-CM

## 2019-06-13 DIAGNOSIS — E119 Type 2 diabetes mellitus without complications: Secondary | ICD-10-CM

## 2019-06-13 DIAGNOSIS — I10 Essential (primary) hypertension: Secondary | ICD-10-CM

## 2019-06-13 DIAGNOSIS — M797 Fibromyalgia: Secondary | ICD-10-CM | POA: Diagnosis not present

## 2019-06-13 DIAGNOSIS — Z8719 Personal history of other diseases of the digestive system: Secondary | ICD-10-CM

## 2019-06-13 DIAGNOSIS — F5101 Primary insomnia: Secondary | ICD-10-CM | POA: Diagnosis not present

## 2019-06-13 DIAGNOSIS — M8589 Other specified disorders of bone density and structure, multiple sites: Secondary | ICD-10-CM

## 2019-06-13 DIAGNOSIS — Z9889 Other specified postprocedural states: Secondary | ICD-10-CM

## 2019-06-13 DIAGNOSIS — R5383 Other fatigue: Secondary | ICD-10-CM

## 2019-06-13 DIAGNOSIS — Z8639 Personal history of other endocrine, nutritional and metabolic disease: Secondary | ICD-10-CM

## 2019-06-13 DIAGNOSIS — H40059 Ocular hypertension, unspecified eye: Secondary | ICD-10-CM

## 2019-06-13 MED ORDER — LIDOCAINE 5 % EX PTCH: 1.0000 | MEDICATED_PATCH | CUTANEOUS | 2 refills | Status: DC

## 2019-06-13 NOTE — Telephone Encounter (Signed)
I called patient, patient will have DEXA performed at [facility], patient does not go to [facility], where last DEXA was performed, any longer due to insurance. Order for DEXA faxed to [facility].

## 2019-06-13 NOTE — Patient Instructions (Signed)

## 2019-06-13 NOTE — Telephone Encounter (Signed)
Patient left a voicemail stating she was returning a call to the office.   °

## 2019-06-13 NOTE — Telephone Encounter (Signed)
Submitted a Prior Authorization request to [facility] for Lidocaine Patches via Cover My Meds. Will update once we receive a response.

## 2019-06-14 NOTE — Telephone Encounter (Signed)
Received a fax regarding Prior Authorization from [facility] for Lidocaine Patches . Authorization has been DENIED because the medicine is only covered for post herpetic Neuralgia or Neuropathic pain .  Phone#[phone]

## 2019-06-15 ENCOUNTER — Other Ambulatory Visit: Payer: Self-pay

## 2019-06-15 ENCOUNTER — Ambulatory Visit
Admission: RE | Admit: 2019-06-15 | Discharge: 2019-06-15 | Disposition: A | Discharge status: Home / Self Care | Payer: 59 | Source: Ambulatory Visit | Attending: Internal Medicine | Admitting: Internal Medicine

## 2019-06-15 DIAGNOSIS — Z1231 Encounter for screening mammogram for malignant neoplasm of breast: Secondary | ICD-10-CM

## 2019-06-20 ENCOUNTER — Other Ambulatory Visit: Payer: Self-pay | Admitting: Internal Medicine

## 2019-06-20 ENCOUNTER — Other Ambulatory Visit: Payer: Self-pay | Admitting: Rheumatology

## 2019-06-20 ENCOUNTER — Telehealth: Payer: Self-pay | Admitting: Rheumatology

## 2019-06-20 NOTE — Telephone Encounter (Signed)
Attempted to contact the patient and left message for patient to call the office.  

## 2019-06-20 NOTE — Telephone Encounter (Signed)
Patient calling to let you know the Lidoderm patches where denied by her insurance stating they do not cover those for Fibromyalgia. Patient wants to know if we can do something different to get patches covered for her. Patient uses [facility] in [place]. Please call to advise.

## 2019-06-20 NOTE — Telephone Encounter (Signed)
Patient advised she could use a god rx coupon to get the prescription at a discounted price since her insurance will not cover it.

## 2019-06-20 NOTE — Telephone Encounter (Signed)
Last visit: 06/13/19 Next Visit: 12/14/19  Okay to refill per Dr. Estanislado Pandy

## 2019-06-29 LAB — HM DEXA SCAN

## 2019-07-03 ENCOUNTER — Other Ambulatory Visit: Payer: 59

## 2019-07-06 ENCOUNTER — Telehealth: Payer: Self-pay | Admitting: *Deleted

## 2019-07-06 NOTE — Telephone Encounter (Signed)
Received DEXA results from [facility].  Date of Scan: 06/29/19 Lowest T-score and site measured: -2.7 AP Spine  Significant changes in BMD and site measured (5% and above): n/a  Current Regimen: n/a  Recommendation: Schedule patient an appointment to discuss treatment options.  Attempted to contact the patient and left message for patient to call the office.

## 2019-07-10 ENCOUNTER — Encounter: Payer: Self-pay | Admitting: Internal Medicine

## 2019-07-11 ENCOUNTER — Other Ambulatory Visit: Payer: Self-pay

## 2019-07-11 ENCOUNTER — Encounter: Payer: Self-pay | Admitting: Rheumatology

## 2019-07-11 ENCOUNTER — Ambulatory Visit: Payer: 59 | Admitting: Rheumatology

## 2019-07-11 ENCOUNTER — Telehealth: Payer: Self-pay

## 2019-07-11 VITALS — BP 120/78 | HR 76 | Resp 16 | Ht 60.0 in | Wt 176.2 lb

## 2019-07-11 DIAGNOSIS — M797 Fibromyalgia: Secondary | ICD-10-CM | POA: Diagnosis not present

## 2019-07-11 DIAGNOSIS — M810 Age-related osteoporosis without current pathological fracture: Secondary | ICD-10-CM | POA: Diagnosis not present

## 2019-07-11 DIAGNOSIS — Z5181 Encounter for therapeutic drug level monitoring: Secondary | ICD-10-CM

## 2019-07-11 DIAGNOSIS — Z8719 Personal history of other diseases of the digestive system: Secondary | ICD-10-CM

## 2019-07-11 DIAGNOSIS — M62838 Other muscle spasm: Secondary | ICD-10-CM

## 2019-07-11 DIAGNOSIS — I10 Essential (primary) hypertension: Secondary | ICD-10-CM

## 2019-07-11 DIAGNOSIS — F5101 Primary insomnia: Secondary | ICD-10-CM | POA: Diagnosis not present

## 2019-07-11 DIAGNOSIS — H40059 Ocular hypertension, unspecified eye: Secondary | ICD-10-CM

## 2019-07-11 DIAGNOSIS — E119 Type 2 diabetes mellitus without complications: Secondary | ICD-10-CM

## 2019-07-11 DIAGNOSIS — G894 Chronic pain syndrome: Secondary | ICD-10-CM

## 2019-07-11 DIAGNOSIS — Z8639 Personal history of other endocrine, nutritional and metabolic disease: Secondary | ICD-10-CM

## 2019-07-11 DIAGNOSIS — Z9889 Other specified postprocedural states: Secondary | ICD-10-CM

## 2019-07-11 DIAGNOSIS — R5383 Other fatigue: Secondary | ICD-10-CM

## 2019-07-11 NOTE — Telephone Encounter (Signed)
Patient was in the office today for an appointment to discuss bone density results. Dr. Estanislado Pandy discussed reclast infusions. Please call patient to discuss further. Thanks!

## 2019-07-11 NOTE — Progress Notes (Signed)
Office Visit Note  Patient: Courtney Guzman             Date of Birth: May 19, 1960           MRN: [id number]             PCP: Jearld Fenton, NP Referring: Jearld Fenton, NP Visit Date: 07/11/2019 Occupation: @GUAROCC @  Subjective:  Results (Bone density results)   History of Present Illness: Courtney Guzman is a 59 y.o. female with history of fibromyalgia, chronic pain and osteoporosis.  She came today to discuss the DEXA results.  She states she continues to have generalized pain and discomfort and has had no relief from the medication she is currently taking.  She has been taking calcium and vitamin D for osteopenia.  Activities of Daily Living:  Patient reports morning stiffness for 3 minutes.   Patient Reports nocturnal pain.  Difficulty dressing/grooming: Denies Difficulty climbing stairs: Denies Difficulty getting out of chair: Denies Difficulty using hands for taps, buttons, cutlery, and/or writing: Denies  Review of Systems  Constitutional: Positive for fatigue. Negative for night sweats, weight gain and weight loss.  HENT: Negative for mouth sores, trouble swallowing, trouble swallowing, mouth dryness and nose dryness.   Eyes: Negative for pain, redness, visual disturbance and dryness.  Respiratory: Negative for cough, shortness of breath and difficulty breathing.   Cardiovascular: Negative for chest pain, palpitations, hypertension, irregular heartbeat and swelling in legs/feet.  Gastrointestinal: Negative for blood in stool, constipation and diarrhea.  Endocrine: Positive for heat intolerance. Negative for increased urination.  Genitourinary: Negative for difficulty urinating and vaginal dryness.  Musculoskeletal: Positive for arthralgias, joint pain, morning stiffness and muscle tenderness. Negative for joint swelling, myalgias, muscle weakness and myalgias.  Skin: Negative for color change, rash, hair loss, skin tightness, ulcers and sensitivity to sunlight.    Allergic/Immunologic: Negative for susceptible to infections.  Neurological: Positive for numbness. Negative for dizziness, memory loss, night sweats and weakness.  Hematological: Positive for bruising/bleeding tendency. Negative for swollen glands.  Psychiatric/Behavioral: Positive for sleep disturbance. Negative for depressed mood. The patient is not nervous/anxious.     PMFS History:  Patient Active Problem List   Diagnosis Date Noted  . Osteoarthritis 10/30/2018  . Iron deficiency anemia 10/30/2018  . Essential hypertension 08/11/2016  . Primary insomnia 07/01/2016  . 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 Seasonal allergic rhinitis due to pollen 03/03/2016  . Type 2 diabetes mellitus without complication, without long-term current use of insulin ([place]) 06/14/2015  . Hyperlipidemia 05/10/2014  . Family history of ovarian cancer 04/08/2012  . Osteopenia 03/22/2012  . GERD (gastroesophageal reflux disease)   . Fibromyalgia syndrome 07/26/2008    Past Medical History:  Diagnosis Date  . Allergy    seasonal  . Arthritis   . Diabetes mellitus without complication ([place])   . Fibromyalgia    dr. Marveen Reeks  . GERD (gastroesophageal reflux disease)   . Hyperlipidemia   . Hypertension   . Wheezes    with season changes     Family History  Problem Relation Age of Onset  . Hypertension Mother   . Diabetes Sister   . Cancer Maternal Grandmother        OVARIAN  . Ovarian cancer Maternal Grandmother   . Colon cancer Neg Hx   . Colon polyps Neg Hx    Past Surgical History:  Procedure Laterality Date  . [place]   with BTL  . COLONOSCOPY    . COLONOSCOPY W/  POLYPECTOMY  2006   BENIGN ADENOMA   . POLYPECTOMY    . TONSILLECTOMY AND ADENOIDECTOMY     Social History   Social History Narrative  . Not on file   Immunization History  Administered Date(s) Administered  . Influenza,inj,Quad PF,6+ Mos 01/18/2014, 02/19/2016  . Influenza-Unspecified 01/27/2017   . PPD Test 03/17/2011  . Tdap 04/11/2013, 03/08/2016     Objective: Vital Signs: BP 120/78 (BP Location: Left Arm, Patient Position: Sitting, Cuff Size: Normal)   Pulse 76   Resp 16   Ht 5' (1.524 m)   Wt 176 lb 3.2 oz (79.9 kg)   LMP 02/24/2008   BMI 34.41 kg/m    Physical Exam Vitals and nursing note reviewed.  Constitutional:      Appearance: She is well-developed.  HENT:     Head: Normocephalic and atraumatic.  Eyes:     Conjunctiva/sclera: Conjunctivae normal.  Cardiovascular:     Rate and Rhythm: Normal rate and regular rhythm.     Heart sounds: Normal heart sounds.  Pulmonary:     Effort: Pulmonary effort is normal.     Breath sounds: Normal breath sounds.  Abdominal:     General: Bowel sounds are normal.     Palpations: Abdomen is soft.  Musculoskeletal:     Cervical back: Normal range of motion.  Lymphadenopathy:     Cervical: No cervical adenopathy.  Skin:    General: Skin is warm and dry.     Capillary Refill: Capillary refill takes less than 2 seconds.  Neurological:     Mental Status: She is alert and oriented to person, place, and time.  Psychiatric:        Behavior: Behavior normal.      Musculoskeletal Exam: C-spine and lumbar spine with good range of motion.  Shoulder joints, elbow joints, wrist joints, MCPs PIPs and DIPs been good range of motion with no synovitis.  Hip joints, knee joints, ankles and MTPs with good range of motion with no synovitis.  She has generalized hyperalgesia and positive tender points.  CDAI Exam: CDAI Score: -- Patient Global: --; Provider Global: -- Swollen: --; Tender: -- Joint Exam 07/11/2019   No joint exam has been documented for this visit   There is currently no information documented on the homunculus. Go to the Rheumatology activity and complete the homunculus joint exam.  Investigation: No additional findings.  Imaging: MM 3D SCREEN BREAST BILATERAL  Result Date: 06/15/2019 CLINICAL DATA:   Screening. EXAM: DIGITAL SCREENING BILATERAL MAMMOGRAM WITH TOMO AND CAD COMPARISON:  Previous exam(s). ACR Breast Density Category b: There are scattered areas of fibroglandular density. FINDINGS: There are no findings suspicious for malignancy. Images were processed with CAD. IMPRESSION: No mammographic evidence of malignancy. A result letter of this screening mammogram will be mailed directly to the patient. RECOMMENDATION: Screening mammogram in one year. (Code:SM-B-01Y) BI-RADS CATEGORY  1: Negative. Electronically Signed   By: Zerita Boers M.D.   On: 06/15/2019 10:56    Recent Labs: Lab Results  Component Value Date   WBC 6.0 10/27/2018   HGB 12.6 10/27/2018   PLT 333 10/27/2018   NA 143 10/27/2018   K 4.8 10/27/2018   CL 106 10/27/2018   CO2 24 10/27/2018   GLUCOSE 96 10/27/2018   BUN 10 10/27/2018   CREATININE 0.62 10/27/2018   BILITOT 0.5 10/27/2018   ALKPHOS 86 10/27/2018   AST 22 10/27/2018   ALT 14 10/27/2018   PROT 6.9 10/27/2018   ALBUMIN 4.7  10/27/2018   CALCIUM 9.9 10/27/2018   GFRAA 115 10/27/2018    Speciality Comments: No specialty comments available.  Procedures:  No procedures performed Allergies: Patient has no known allergies.   Assessment / Plan:     Visit Diagnoses: Age-related osteoporosis without current pathological fracture - DEXA 06/29/2019 T-score: -2.7 AP Spine. DEXA on 07/31/13 AP spine BMD 0.839 with T-score -1.9. -Detailed counseling guarding osteoporosis was provided.  Different treatment options and their side effects were discussed.  In my opinion she would be a better candidate for IV Reclast because she has severe reflux.  After detailed discussion patient is still not sure if she wants to proceed with the medication.  I will have her pharmacist,  Amber Yopp contact her and discuss treatment options.  Prior to her IV Reclast infusion if approved she will need following labs.  Patient understands that she will be returned to get the lab work.   Plan: Parathyroid hormone, intact (no Ca), VITAMIN D 25 Hydroxy (Vit-D Deficiency, Fractures), TSH, Serum protein electrophoresis with reflex, COMPLETE METABOLIC PANEL WITH GFR  Medication monitoring encounter - Plan: COMPLETE METABOLIC PANEL WITH GFR  Fibromyalgia syndrome -patient is on gabapentin 100 mg p.o. nightly, robaxin 100 mg p.o. twice daily, and Lidoderm patches.  Patient states this despite of these medications she has not had any benefit.  She believes that her pain is not adequately controlled.  Primary insomnia-she has been taking medications to help.  Other fatigue-she continues to have chronic fatigue.  Chronic pain syndrome-she has suffered from chronic pain for many years.  And continues to have discomfort.  I offered pain management referral.  She was in agreement.  Will refer to pain management.  Trapezius muscle spasm -she has chronic discomfort.  History of bilateral carpal tunnel release  Raised intraocular pressure, unspecified laterality  History of hyperlipidemia  Essential hypertension  Type 2 diabetes mellitus without complication, without long-term current use of insulin ([facility])  History of gastroesophageal reflux (GERD)  Orders: Orders Placed This Encounter  Procedures  . Parathyroid hormone, intact (no Ca)  . VITAMIN D 25 Hydroxy (Vit-D Deficiency, Fractures)  . TSH  . Serum protein electrophoresis with reflex  . COMPLETE METABOLIC PANEL WITH GFR  . Ambulatory referral to Pain Clinic   No orders of the defined types were placed in this encounter.   Face-to-face time spent with patient was 30 minutes. Greater than 50% of time was spent in counseling and coordination of care.  Follow-Up Instructions: Return in about 6 months (around 01/11/2020) for [place] [facility].   Bo Merino, MD  Note - This record has been created using Editor, commissioning.  Chart creation errors have been sought, but may not always  have been located. Such creation errors do  not reflect on  the standard of medical care.

## 2019-07-11 NOTE — Patient Instructions (Signed)

## 2019-07-12 NOTE — Telephone Encounter (Signed)
Called [employer] to check benefits for Reclast- J3489  Phone: [phone]  Re# [id number]/ [id number]  Per Insurance, Reclast- J3489- does NOT require precertification in the outpatient setting as long as the location is considered in-network.  is in-network with this plan. Patient's plan pays 80% and patient is responsible for 20% after deductible. Plan has a $500 annual deductible, and $195.26 has been met as of today. Plan also has $150 copay for hospital-based procedures.  Plan requires chart notes to be submitted with billed claim.  Ref# [id number]  9:52 AM Rachael N Perry, CPhT 

## 2019-07-13 ENCOUNTER — Other Ambulatory Visit: Payer: Self-pay | Admitting: Pharmacist

## 2019-07-13 ENCOUNTER — Encounter: Payer: Self-pay | Admitting: Physical Medicine and Rehabilitation

## 2019-07-13 DIAGNOSIS — M810 Age-related osteoporosis without current pathological fracture: Secondary | ICD-10-CM

## 2019-07-13 NOTE — Addendum Note (Signed)
Addended by: Mariella Saa C on: 07/13/2019 11:48 AM   Modules accepted: Orders

## 2019-07-13 NOTE — Progress Notes (Signed)
Patient prefers to have labs drawn at [facility].

## 2019-07-13 NOTE — Addendum Note (Signed)
Addended by: Mariella Saa C on: 07/13/2019 11:49 AM   Modules accepted: Orders

## 2019-07-14 ENCOUNTER — Encounter: Payer: Self-pay | Admitting: Internal Medicine

## 2019-07-15 LAB — CBC WITH DIFFERENTIAL/PLATELET
Basophils Absolute: 0.1 10*3/uL (ref 0.0–0.2)
Basos: 1 %
EOS (ABSOLUTE): 0.2 10*3/uL (ref 0.0–0.4)
Eos: 3 %
Hematocrit: 39.3 % (ref 34.0–46.6)
Hemoglobin: 12.4 g/dL (ref 11.1–15.9)
Immature Grans (Abs): 0 10*3/uL (ref 0.0–0.1)
Immature Granulocytes: 0 %
Lymphocytes Absolute: 2.7 10*3/uL (ref 0.7–3.1)
Lymphs: 44 %
MCH: 27.6 pg (ref 26.6–33.0)
MCHC: 31.6 g/dL (ref 31.5–35.7)
MCV: 87 fL (ref 79–97)
Monocytes Absolute: 0.5 10*3/uL (ref 0.1–0.9)
Monocytes: 8 %
Neutrophils Absolute: 2.7 10*3/uL (ref 1.4–7.0)
Neutrophils: 44 %
Platelets: 350 10*3/uL (ref 150–450)
RBC: 4.5 x10E6/uL (ref 3.77–5.28)
RDW: 13.5 % (ref 11.7–15.4)
WBC: 6.1 10*3/uL (ref 3.4–10.8)

## 2019-07-15 LAB — COMPREHENSIVE METABOLIC PANEL
ALT: 14 IU/L (ref 0–32)
AST: 22 IU/L (ref 0–40)
Albumin/Globulin Ratio: 1.8 (ref 1.2–2.2)
Albumin: 4.5 g/dL (ref 3.8–4.9)
Alkaline Phosphatase: 101 IU/L (ref 39–117)
BUN/Creatinine Ratio: 12 (ref 9–23)
BUN: 8 mg/dL (ref 6–24)
Bilirubin Total: 0.4 mg/dL (ref 0.0–1.2)
CO2: 22 mmol/L (ref 20–29)
Calcium: 9.9 mg/dL (ref 8.7–10.2)
Chloride: 101 mmol/L (ref 96–106)
Creatinine, Ser: 0.68 mg/dL (ref 0.57–1.00)
GFR calc Af Amer: 111 mL/min/{1.73_m2} (ref >59)
GFR calc non Af Amer: 96 mL/min/{1.73_m2} (ref >59)
Globulin, Total: 2.5 g/dL (ref 1.5–4.5)
Glucose: 109 mg/dL — ABNORMAL HIGH (ref 65–99)
Potassium: 4.7 mmol/L (ref 3.5–5.2)
Sodium: 137 mmol/L (ref 134–144)
Total Protein: 7 g/dL (ref 6.0–8.5)

## 2019-07-15 LAB — TSH: TSH: 1.58 u[IU]/mL (ref 0.450–4.500)

## 2019-07-15 LAB — PARATHYROID HORMONE, INTACT (NO CA): PTH: 29 pg/mL (ref 15–65)

## 2019-07-15 LAB — VITAMIN D 25 HYDROXY (VIT D DEFICIENCY, FRACTURES): Vit D, 25-Hydroxy: 32.8 ng/mL (ref 30.0–100.0)

## 2019-07-16 ENCOUNTER — Other Ambulatory Visit: Payer: Self-pay | Admitting: Internal Medicine

## 2019-07-19 ENCOUNTER — Other Ambulatory Visit: Payer: Self-pay | Admitting: Pharmacist

## 2019-07-19 DIAGNOSIS — M810 Age-related osteoporosis without current pathological fracture: Secondary | ICD-10-CM

## 2019-07-19 NOTE — Progress Notes (Signed)
Labs resulted and within normal limits to receive Reclast infusion.  Orders placed.  Called to notify patient but no answer.  Left voicemail that she can call [facility] at [phone] to schedule infusion.  Also reminded patient that she must schedule infusion by 08/14/19 or we will have to repeat labs.    Instructed patient to call with any questions or concerns.   Mariella Saa, PharmD, [place], [place] Clinical Specialty Pharmacist [phone]  07/19/2019 9:08 AM

## 2019-07-24 ENCOUNTER — Telehealth: Payer: Self-pay | Admitting: Rheumatology

## 2019-07-24 ENCOUNTER — Other Ambulatory Visit: Payer: Self-pay | Admitting: Rheumatology

## 2019-07-24 NOTE — Telephone Encounter (Signed)
Patient called stating she "had a couple of questions regarding her fibromyalgia and osteoperosis" and requested a return call.

## 2019-07-24 NOTE — Telephone Encounter (Signed)
Attempted to contact the patient and left message for patient to call the office.  

## 2019-07-25 NOTE — Telephone Encounter (Signed)
Last Visit: 07/11/2019 Next Visit: 12/14/2019  Last fill: 04/10/2019  Okay to refill per Dr. Estanislado Pandy

## 2019-07-26 NOTE — Telephone Encounter (Signed)
Attempted to contact the patient and left message for patient to call the office.  

## 2019-07-28 NOTE — Telephone Encounter (Signed)
Attempted to contact the patient and left message for patient to call the office.  

## 2019-07-28 NOTE — Telephone Encounter (Signed)
Patient left a message stating she was returning your call. 07/28/19

## 2019-07-28 NOTE — Telephone Encounter (Signed)
Attempted to contact the patient and let message for patient to call the office.

## 2019-07-28 NOTE — Telephone Encounter (Signed)
Patient returned call to the office and stated she was fine. Patient states she does not want to leave question she previously had.

## 2019-08-04 ENCOUNTER — Encounter: Payer: Self-pay | Admitting: Physical Medicine and Rehabilitation

## 2019-08-04 ENCOUNTER — Encounter: Payer: 59 | Attending: Physical Medicine and Rehabilitation | Admitting: Physical Medicine and Rehabilitation

## 2019-08-04 ENCOUNTER — Other Ambulatory Visit: Payer: Self-pay

## 2019-08-04 VITALS — BP 127/87 | HR 69 | Temp 97.3°F | Ht 60.0 in | Wt 176.4 lb

## 2019-08-04 DIAGNOSIS — M25511 Pain in right shoulder: Secondary | ICD-10-CM | POA: Diagnosis present

## 2019-08-04 DIAGNOSIS — M25512 Pain in left shoulder: Secondary | ICD-10-CM | POA: Insufficient documentation

## 2019-08-04 DIAGNOSIS — Z79891 Long term (current) use of opiate analgesic: Secondary | ICD-10-CM | POA: Diagnosis present

## 2019-08-04 DIAGNOSIS — G894 Chronic pain syndrome: Secondary | ICD-10-CM | POA: Diagnosis not present

## 2019-08-04 DIAGNOSIS — G8929 Other chronic pain: Secondary | ICD-10-CM | POA: Diagnosis present

## 2019-08-04 DIAGNOSIS — Z5181 Encounter for therapeutic drug level monitoring: Secondary | ICD-10-CM | POA: Diagnosis not present

## 2019-08-04 DIAGNOSIS — M797 Fibromyalgia: Secondary | ICD-10-CM | POA: Insufficient documentation

## 2019-08-04 MED ORDER — TIZANIDINE HCL 2 MG PO CAPS: 2.0000 mg | ORAL_CAPSULE | Freq: Every evening | ORAL | 3 refills | Status: DC | PRN

## 2019-08-04 NOTE — Patient Instructions (Signed)
Start Tizanidine 2mg  HS prn for muscle spasms. STOP Methocarbamol.  Increase Gabapentin to 300mg  three times per day.  Start physical therapy.  Continue heat therapy and lidocaine patches

## 2019-08-04 NOTE — Progress Notes (Signed)
Subjective:    Patient ID: Courtney Guzman, female    DOB: March 07, 1961, 59 y.o.   MRN: [id number]  HPI  Courtney Guzman is a 59 year old woman who presents with bilateral shoulder, hand, and hip pain for many years. She has fibromyalgia. She was referred today by Dr. Estanislado Pandy.  Pain is on average 9-10. The pain is sharp, burning, tingling, aching. The pain interferes with her general activity and enjoyment of life.   She is unable to get corticosteroid shots due to her glaucoma.   She has osteoporosis based on her most recent bone scan.   She has a history of bilateral carpal tunnel syndrome.   Her sleep at night is fair.   She tried Lyrica a few days ago but it "messed with her mind." She was started on a medication in the Wellbutrin family and she stopped it suddenly and had bleeding. Had to be tapered off. She has also tried Aleve, Tylenol, Lidocaine patches. The Lidocaine patches really helped.   She can lift up her arms but if she lifts them too long they feel heavy. When she claps her hands she feels tingling in all her fingers.   She has tried massage before but had an issue with insurance approval.   Pain Inventory Average Pain 8 Pain Right Now 10 My pain is sharp, burning, tingling and aching  In the last 24 hours, has pain interfered with the following? General activity 7 Relation with others 7 Enjoyment of life 4 What TIME of day is your pain at its worst? daytime and night Sleep (in general) Fair  Pain is worse with: sitting and some activites Pain improves with: heat/ice and other Relief from Meds: 6  Mobility walk without assistance ability to climb steps?  yes do you drive?  yes  Function retired  Neuro/Psych numbness tingling  Prior Studies Any changes since last visit?  no  Physicians involved in your care Any changes since last visit?  no   Family History  Problem Relation Age of Onset  . Hypertension Mother   . Diabetes Sister   . Cancer  Maternal Grandmother        OVARIAN  . Ovarian cancer Maternal Grandmother   . Colon cancer Neg Hx   . Colon polyps Neg Hx    Social History   Socioeconomic History  . Marital status: Married    Spouse name: Not on file  . Number of children: Not on file  . Years of education: Not on file  . Highest education level: Not on file  Occupational History  . Not on file  Tobacco Use  . Smoking status: Never Smoker  . Smokeless tobacco: Never Used  Substance and Sexual Activity  . Alcohol use: No    Alcohol/week: 0.0 standard drinks  . Drug use: No  . Sexual activity: Yes    Partners: Male    Birth control/protection: Surgical    Comment: TUBAL LIGATION, 1st sexual encounter at 39, has had 5   Other Topics Concern  . Not on file  Social History Narrative  . Not on file   Social Determinants of Health   Financial Resource Strain:   . Difficulty of Paying Living Expenses:   Food Insecurity:   . Worried About Charity fundraiser in the Last Year:   . Arboriculturist in the Last Year:   Transportation Needs:   . Film/video editor (Medical):   Marland Kitchen Lack of Transportation (Non-Medical):  Physical Activity:   . Days of Exercise per Week:   . Minutes of Exercise per Session:   Stress:   . Feeling of Stress :   Social Connections:   . Frequency of Communication with Friends and Family:   . Frequency of Social Gatherings with Friends and Family:   . Attends Religious Services:   . Active Member of Clubs or Organizations:   . Attends Archivist Meetings:   Marland Kitchen Marital Status:    Past Surgical History:  Procedure Laterality Date  . [place]   with BTL  . COLONOSCOPY    . COLONOSCOPY W/ POLYPECTOMY  2006   BENIGN ADENOMA   . POLYPECTOMY    . TONSILLECTOMY AND ADENOIDECTOMY     Past Medical History:  Diagnosis Date  . Allergy    seasonal  . Arthritis   . Diabetes mellitus without complication ([place])   . Fibromyalgia    dr. Marveen Reeks  . GERD  (gastroesophageal reflux disease)   . Hyperlipidemia   . Hypertension   . Wheezes    with season changes    BP 127/87   Pulse 69   Temp (!) 97.3 F (36.3 C)   Ht 5' (1.524 m)   Wt 176 lb 6.4 oz (80 kg)   LMP 02/24/2008   SpO2 94%   BMI 34.45 kg/m   Opioid Risk Score:   Fall Risk Score:  `1  Depression screen PHQ 2/9  Depression screen [facility] 2/9 10/30/2018 08/17/2017 08/11/2016 03/03/2016 06/04/2015 03/01/2015 05/10/2014  Decreased Interest 0 0 0 0 0 0 0  Down, Depressed, Hopeless 0 0 0 0 0 0 0  PHQ - 2 Score 0 0 0 0 0 0 0   Review of Systems  Constitutional: Negative.   HENT: Negative.   Eyes: Negative.   Respiratory: Negative.   Cardiovascular: Negative.   Gastrointestinal: Negative.   Endocrine:       High blood sugars  Genitourinary: Negative.   Musculoskeletal: Negative.   Skin: Negative.   Allergic/Immunologic: Negative.   Neurological: Positive for numbness.       Tingling  Hematological: Negative.   Psychiatric/Behavioral: Negative.   All other systems reviewed and are negative.      Objective:   Physical Exam Gen: no distress, normal appearing HEENT: oral mucosa pink and moist, NCAT Cardio: Reg rate Chest: normal effort, normal rate of breathing Abd: soft, non-distended Ext: no edema Skin: intact Neuro: AOx3 Musculoskeletal: 5/5 strength throughout.  Sensation impaired throughout right side.  Protracted posture. Tender to palpation in muscles or upper back and neck.  Psych: pleasant, normal affect    Assessment & Plan:  Courtney Guzman is a 59 year old woman who presents with bilateral shoulder, hand, and hip pain for many years. She has fibromyalgia  --Will increase Gabapentin to 300mg  TID. Does not need new script as has 100mg  tablets at home. She will call as needed for refill.   --Physical therapy for postural correction, stretching, strengthening of the muscles of neck and upper back, HEP  --Continue heat therapy at home.   --Continue  Lidocaine patch.   All questions answered. RTC in 1 month.

## 2019-08-08 LAB — TOXASSURE SELECT,+ANTIDEPR,UR

## 2019-08-10 ENCOUNTER — Ambulatory Visit (HOSPITAL_COMMUNITY)
Admission: RE | Admit: 2019-08-10 | Discharge: 2019-08-10 | Disposition: A | Discharge status: Home / Self Care | Payer: 59 | Source: Ambulatory Visit | Attending: Rheumatology | Admitting: Rheumatology

## 2019-08-10 ENCOUNTER — Other Ambulatory Visit: Payer: Self-pay

## 2019-08-10 DIAGNOSIS — M810 Age-related osteoporosis without current pathological fracture: Secondary | ICD-10-CM | POA: Diagnosis present

## 2019-08-10 MED ORDER — ACETAMINOPHEN 325 MG PO TABS
ORAL_TABLET | ORAL | Status: AC
  Administered 2019-08-10: 650 mg via ORAL
  Filled 2019-08-10: qty 2

## 2019-08-10 MED ORDER — ACETAMINOPHEN 325 MG PO TABS: 650.0000 mg | ORAL_TABLET | Freq: Once | ORAL | Status: AC

## 2019-08-10 MED ORDER — DIPHENHYDRAMINE HCL 25 MG PO CAPS: 25.0000 mg | ORAL_CAPSULE | Freq: Once | ORAL | Status: AC

## 2019-08-10 MED ORDER — ZOLEDRONIC ACID 5 MG/100ML IV SOLN
INTRAVENOUS | Status: AC
  Filled 2019-08-10: qty 100

## 2019-08-10 MED ORDER — DIPHENHYDRAMINE HCL 25 MG PO CAPS
ORAL_CAPSULE | ORAL | Status: AC
  Administered 2019-08-10: 25 mg via ORAL
  Filled 2019-08-10: qty 1

## 2019-08-10 MED ORDER — ZOLEDRONIC ACID 5 MG/100ML IV SOLN
5.0000 mg | Freq: Once | INTRAVENOUS | Status: AC
  Administered 2019-08-10: 5 mg via INTRAVENOUS

## 2019-08-10 MED ORDER — SODIUM CHLORIDE 0.9 % IV SOLN: INTRAVENOUS | Status: DC

## 2019-08-10 NOTE — Discharge Instructions (Signed)

## 2019-08-11 ENCOUNTER — Telehealth: Payer: Self-pay | Admitting: Rheumatology

## 2019-08-11 DIAGNOSIS — M810 Age-related osteoporosis without current pathological fracture: Secondary | ICD-10-CM

## 2019-08-11 MED ORDER — ZOLEDRONIC ACID 5 MG/100ML IV SOLN: 5.0000 mg | Freq: Once | INTRAVENOUS | 0 refills | Status: AC

## 2019-08-11 NOTE — Telephone Encounter (Signed)
Returned patient call.  Advised that it sounds like she is experiencing a normal infusion type reaction.  Advised that headache, muscle and bone aches, and upset stomach are common side effects after receiving request.  Advised that she can continue to take Tylenol and Pepto-Bismol as needed for her symptoms and to call our office if it persist more than 1 week.  Patient verbalized understanding.  All questions encouraged and answered.  Instructed patient to call with any further questions or concerns.   Mariella Saa, PharmD, [place], [place] Clinical Specialty Pharmacist [phone]  08/11/2019 10:13 AM

## 2019-08-11 NOTE — Telephone Encounter (Signed)
Patient called stating she had her Reclast infusion yesterday 08/10/19 and woke up this morning around 5:00 am with headache, lower back pain, pain in her arms, and soreness under her left breast.  Patient states she took Tylenol and soaked in a warm bath which gave minimal relief.  Patient states she also took her blood pressure medication and now her stomach is upset.  Patient is not sure if these are all normal symptoms from the infusion and requesting a return call.

## 2019-08-14 ENCOUNTER — Telehealth: Payer: Self-pay | Admitting: *Deleted

## 2019-08-14 NOTE — Telephone Encounter (Signed)
Urine drug screen for this encounter is consistent for no controlled medication. 

## 2019-08-18 ENCOUNTER — Encounter: Payer: Self-pay | Admitting: Physical Therapy

## 2019-08-18 ENCOUNTER — Ambulatory Visit: Payer: 59 | Attending: Physical Medicine and Rehabilitation | Admitting: Physical Therapy

## 2019-08-18 ENCOUNTER — Other Ambulatory Visit: Payer: Self-pay

## 2019-08-18 DIAGNOSIS — M546 Pain in thoracic spine: Secondary | ICD-10-CM

## 2019-08-18 DIAGNOSIS — R252 Cramp and spasm: Secondary | ICD-10-CM

## 2019-08-18 DIAGNOSIS — M542 Cervicalgia: Secondary | ICD-10-CM | POA: Diagnosis present

## 2019-08-18 DIAGNOSIS — M6281 Muscle weakness (generalized): Secondary | ICD-10-CM | POA: Diagnosis present

## 2019-08-18 NOTE — Therapy (Signed)
[place], [place], [address] Phone: [phone]   Fax:  [phone]  Physical Therapy Evaluation  Patient Details  Name: Courtney Guzman MRN: [id number] Date of Birth: 1960/09/30 Referring Provider (PT): Dr. Leeroy Cha   Encounter Date: 08/18/2019  PT End of Session - 08/18/19 1143    Visit Number  1    Number of Visits  12    Date for PT Re-Evaluation  09/29/19    Authorization Type  UHC    PT Start Time  0830    PT Stop Time  0930    PT Time Calculation (min)  60 min    Activity Tolerance  Patient tolerated treatment well    Behavior During Therapy  [facility] for tasks assessed/performed       Past Medical History:  Diagnosis Date  . Allergy    seasonal  . Arthritis   . Diabetes mellitus without complication ([place])   . Fibromyalgia    dr. Marveen Reeks  . GERD (gastroesophageal reflux disease)   . Hyperlipidemia   . Hypertension   . Wheezes    with season changes     Past Surgical History:  Procedure Laterality Date  . [place]   with BTL  . COLONOSCOPY    . COLONOSCOPY W/ POLYPECTOMY  2006   BENIGN ADENOMA   . POLYPECTOMY    . TONSILLECTOMY AND ADENOIDECTOMY      There were no vitals filed for this visit.   Subjective Assessment - 08/18/19 0836    Subjective  Patient presents with chronic neck and shoulder pain.  She has been diagnosed with fibromyalgia and osteoporosis.  After the treatment for osteoporosis she had increased pain in L arm.  She has had increased in upper back , neck over the past year.    Pertinent History  carpal tunnel surgery (90's, osteoporosis, fibromyalgia, diabetes, HTN    Limitations  House hold activities;Lifting;Other (comment)   tires quickly   Diagnostic tests  none recent    Patient Stated Goals  Pain control    Currently in Pain?  Yes    Pain Score  7     Pain Location  Back    Pain Orientation  Upper;Right;Left    Pain Descriptors /  Indicators  Aching;Tightness    Pain Type  Chronic pain    Pain Radiating Towards  upper arm at times , hands    Pain Onset  More than a month ago    Pain Frequency  Constant    Aggravating Factors   can be at rest at times    Pain Relieving Factors  heat, massage, meds    Effect of Pain on Daily Activities  limits comfort    Multiple Pain Sites  No         [facility] PT Assessment - 08/18/19 0001      Assessment   Medical Diagnosis  fibromyalgia, neck and shoulder pain     Referring Provider (PT)  Dr. Leeroy Cha    Onset Date/Surgical Date  --   chronic, years   Hand Dominance  Right    Prior Therapy  No       Precautions   Precautions  None      Restrictions   Weight Bearing Restrictions  No      Balance Screen   Has the patient fallen in the past 6 months  Yes    How many times?  3  Has the patient had a decrease in activity level because of a fear of falling?   No    Is the patient reluctant to leave their home because of a fear of falling?   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, technical sales, helps him there at time, was a Corporate treasurer    Leisure  reading, being outside, walking, visiting       Cognition   Overall Cognitive Status  Within Functional Limits for tasks assessed      Observation/Other Assessments   Focus on Therapeutic Outcomes (FOTO)   22%      Sensation   Light Touch  Impaired by gross assessment    Additional Comments  numbness in hands, and feet (burning), not diagnosed with neuropathy      Posture/Postural Control   Posture/Postural Control  Postural limitations    Postural Limitations  Rounded Shoulders;Forward head      AROM   Cervical Flexion  40    Cervical Extension  60    Cervical - Right Side Bend  WNL    Cervical - Left  Side Bend  WNL    Cervical - Right Rotation  WNL    Cervical - Left Rotation  WNL       Strength   Right Shoulder Flexion  4+/5    Right Shoulder ABduction  4+/5    Left Shoulder Flexion  4+/5   pain    Left Shoulder ABduction  4+/5   pain    Right Hand Grip (lbs)  37    Left Hand Grip (lbs)  35      Palpation   Palpation comment  tightness thorughout upper back muscles and posterior cervicals , thoracic paraspinals, rhomboids       Spurling's   Findings  Negative    Comment  bilateral          Objective measurements completed on examination: See above findings.      PT Education - 08/18/19 1143    Education Details  PT/POC, posture,  modalities for pain    Person(s) Educated  Patient    Methods  Explanation;Tactile cues;Verbal cues;Demonstration    Comprehension  Verbalized understanding;Returned demonstration          PT Long Term Goals - 08/18/19 1144      PT LONG TERM GOAL #1   Title  Pt will be able to show independence with HEP for posture, flexibility and strength    Baseline  none given on eval    Time  6    Period  Weeks    Status  New    Target Date  09/29/19      PT LONG TERM GOAL #2   Title  Pt will be able to lift weights at home (and demo) with proper cervical alignment    Time  6    Period  Weeks    Status  New    Target Date  09/29/19      PT LONG TERM GOAL #3   Title  Pt will be able to rely less on pain patches during the week (only 1-2 times per week)    Time  6    Period  Weeks  Status  New    Target Date  09/29/19      PT LONG TERM GOAL #4   Title  Pt will able to lift, carry mod sized item at home without increasing neck pain most of the time.    Time  6    Period  Weeks    Status  New    Target Date  09/29/19             Plan - 08/18/19 1149    Clinical Impression Statement  Patient presents for low complexity eval of chronic widespread pain in neck and shoulders which has impacted her ability to be comfortable  at rest or with mobility. She has been making efforts to reduce her pain with heating pad, massage and has improved to a degree. She will benefit from skilled PT to improve her ability to complete ADLs, home tasks and light work with her husband.    Personal Factors and Comorbidities  Comorbidity 2;Past/Current Experience;Time since onset of injury/illness/exacerbation    Comorbidities  osteoporosis, HTN, fibromyalgia    Examination-Activity Limitations  Lift;Reach Overhead;Carry    Examination-Participation Restrictions  Community Activity;Interpersonal Relationship;Laundry    Stability/Clinical Decision Making  Stable/Uncomplicated    Clinical Decision Making  Low    Rehab Potential  Excellent    PT Frequency  2x / week    PT Duration  6 weeks    PT Treatment/Interventions  ADLs/Self Care Home Management;Moist Heat;Cryotherapy;Ultrasound;Traction;Functional mobility training;Manual techniques;Neuromuscular re-education;Patient/family education;Therapeutic exercise;Dry needling;Taping;Therapeutic activities;Electrical Stimulation;Passive range of motion    PT Next Visit Plan  manual/DN neck and shoulders, establish HEP    Consulted and Agree with Plan of Care  Patient       Patient will benefit from skilled therapeutic intervention in order to improve the following deficits and impairments:  Increased fascial restricitons, Pain, Impaired sensation, Decreased mobility, Postural dysfunction, Decreased range of motion, Decreased strength, Impaired flexibility, Impaired UE functional use  Visit Diagnosis: Cervicalgia  Muscle weakness (generalized)  Pain in thoracic spine  Cramp and spasm     Problem List Patient Active Problem List   Diagnosis Date Noted  . Osteoarthritis 10/30/2018  . Iron deficiency anemia 10/30/2018  . Essential hypertension 08/11/2016  . Primary insomnia 07/01/2016  . 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 Seasonal allergic rhinitis due to pollen 03/03/2016   . Type 2 diabetes mellitus without complication, without long-term current use of insulin ([place]) 06/14/2015  . Hyperlipidemia 05/10/2014  . Family history of ovarian cancer 04/08/2012  . Osteopenia 03/22/2012  . GERD (gastroesophageal reflux disease)   . Fibromyalgia syndrome 07/26/2008    Courtney Guzman 08/18/2019, 12:11 PM  [facility] [address] [place], [place], [address] Phone: [phone]   Fax:  [phone]  Name: Courtney Guzman MRN: [id number] Date of Birth: 07-13-1960   Raeford Razor, PT 08/18/19 12:11 PM Phone: [phone] Fax: [phone]

## 2019-08-21 ENCOUNTER — Ambulatory Visit: Payer: 59 | Attending: Physical Medicine and Rehabilitation | Admitting: Physical Therapy

## 2019-08-21 ENCOUNTER — Other Ambulatory Visit: Payer: Self-pay

## 2019-08-21 DIAGNOSIS — M542 Cervicalgia: Secondary | ICD-10-CM | POA: Diagnosis present

## 2019-08-21 DIAGNOSIS — M6281 Muscle weakness (generalized): Secondary | ICD-10-CM | POA: Insufficient documentation

## 2019-08-21 DIAGNOSIS — R252 Cramp and spasm: Secondary | ICD-10-CM | POA: Diagnosis present

## 2019-08-21 DIAGNOSIS — M546 Pain in thoracic spine: Secondary | ICD-10-CM | POA: Diagnosis present

## 2019-08-21 NOTE — Therapy (Signed)
[place], [place], [address] Phone: [phone]   Fax:  [phone]  Physical Therapy Treatment  Patient Details  Name: Courtney Guzman MRN: [id number] Date of Birth: [birth year] Referring Provider (PT): Dr. Leeroy Cha   Encounter Date: 08/21/2019  PT End of Session - 08/21/19 1440    Visit Number  2    Number of Visits  12    Date for PT Re-Evaluation  09/29/19    Authorization Type  UHC    PT Start Time  1330    PT Stop Time  1414    PT Time Calculation (min)  44 min    Activity Tolerance  Patient tolerated treatment well    Behavior During Therapy  [facility] for tasks assessed/performed       Past Medical History:  Diagnosis Date  . Allergy    seasonal  . Arthritis   . Diabetes mellitus without complication ([place])   . Fibromyalgia    dr. Marveen Reeks  . GERD (gastroesophageal reflux disease)   . Hyperlipidemia   . Hypertension   . Wheezes    with season changes     Past Surgical History:  Procedure Laterality Date  . [place]   with BTL  . COLONOSCOPY    . COLONOSCOPY W/ POLYPECTOMY  2006   BENIGN ADENOMA   . POLYPECTOMY    . TONSILLECTOMY AND ADENOIDECTOMY      There were no vitals filed for this visit.  Subjective Assessment - 08/21/19 1524    Subjective  Patient reports her neck has been about the same . it is tight. She is having tightness in her right upper trap today.    Pertinent History  carpal tunnel surgery (90's, osteoporosis, fibromyalgia, diabetes, HTN    Limitations  House hold activities;Lifting;Other (comment)    Diagnostic tests  none recent    Patient Stated Goals  Pain control    Currently in Pain?  Yes    Pain Score  4     Pain Location  Neck    Pain Orientation  Right    Pain Descriptors / Indicators  Aching    Pain Type  Chronic pain    Pain Onset  More than a month ago    Pain Frequency  Constant    Aggravating Factors   into the scapula    Pain Relieving Factors  heat, massage, meds    Effect of Pain on Daily Activities  limits comfort                       [facility] Adult PT Treatment/Exercise - 08/21/19 0001      Self-Care   Other Self-Care Comments   reviewed benefits and risks of TPDN. Patient given FAQ sheet and sheet reviewed.       Exercises   Exercises  Neck      Neck Exercises: Theraband   Shoulder Extension  20 reps;Red    Rows  20 reps;Red      Manual Therapy   Manual Therapy  Soft tissue mobilization;Manual Traction    Soft tissue mobilization  to bilateral upper traps using IASTYM     Manual Traction  gentle manual traction       Neck Exercises: Stretches   Upper Trapezius Stretch  3 reps;20 seconds;Right    Levator Stretch  3 reps;20 seconds;Right             PT Education - 08/21/19  [place]    Education Details  updated HEP; educated patient on posture    Person(s) Educated  Patient    Methods  Demonstration;Tactile cues;Explanation;Verbal cues    Comprehension  Verbalized understanding;Returned demonstration;Verbal cues required;Tactile cues required          PT Long Term Goals - 08/18/19 1144      PT LONG TERM GOAL #1   Title  Pt will be able to show independence with HEP for posture, flexibility and strength    Baseline  none given on eval    Time  6    Period  Weeks    Status  New    Target Date  09/29/19      PT LONG TERM GOAL #2   Title  Pt will be able to lift weights at home (and demo) with proper cervical alignment    Time  6    Period  Weeks    Status  New    Target Date  09/29/19      PT LONG TERM GOAL #3   Title  Pt will be able to rely less on pain patches during the week (only 1-2 times per week)    Time  6    Period  Weeks    Status  New    Target Date  09/29/19      PT LONG TERM GOAL #4   Title  Pt will able to lift, carry mod sized item at home without increasing neck pain most of the time.    Time  6    Period  Weeks    Status  New     Target Date  09/29/19            Plan - 08/21/19 1440    Clinical Impression Statement  Patient tolerated treatment well. She had spasming of bilateral upper traps R> L. Therapy perfromed IASTYM and manual traction to reduce pain. After treaytment she had near full cervical rotation. She was advised to maintain motion at home but no to over-do it. Therapy updated HEP for stretching and    Personal Factors and Comorbidities  Comorbidity 2;Past/Current Experience;Time since onset of injury/illness/exacerbation    Comorbidities  osteoporosis, HTN, fibromyalgia    Examination-Activity Limitations  Lift;Reach Overhead;Carry    Examination-Participation Restrictions  Community Activity;Interpersonal Relationship;Laundry    Stability/Clinical Decision Making  Stable/Uncomplicated    Clinical Decision Making  Low    Rehab Potential  Excellent    PT Frequency  2x / week    PT Duration  6 weeks    PT Treatment/Interventions  ADLs/Self Care Home Management;Moist Heat;Cryotherapy;Ultrasound;Traction;Functional mobility training;Manual techniques;Neuromuscular re-education;Patient/family education;Therapeutic exercise;Dry needling;Taping;Therapeutic activities;Electrical Stimulation;Passive range of motion    PT Next Visit Plan  review HEP; continue with manual therapy. Consider pec strech; consider supine wand flexion;    PT Home Exercise Plan  scap retraction; shoulder extension; cerivcal rotation in pain free range, upper trap stretch, levator strech    Consulted and Agree with Plan of Care  Patient       Patient will benefit from skilled therapeutic intervention in order to improve the following deficits and impairments:  Increased fascial restricitons, Pain, Impaired sensation, Decreased mobility, Postural dysfunction, Decreased range of motion, Decreased strength, Impaired flexibility, Impaired UE functional use  Visit Diagnosis: Cervicalgia  Muscle weakness (generalized)  Pain in thoracic  spine  Cramp and spasm     Problem List Patient Active Problem List   Diagnosis Date Noted  . Osteoarthritis 10/30/2018  .  Iron deficiency anemia 10/30/2018  . Essential hypertension 08/11/2016  . Primary insomnia 07/01/2016  . 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 Seasonal allergic rhinitis due to pollen 03/03/2016  . Type 2 diabetes mellitus without complication, without long-term current use of insulin ([place]) 06/14/2015  . Hyperlipidemia 05/10/2014  . Family history of ovarian cancer 04/08/2012  . Osteopenia 03/22/2012  . GERD (gastroesophageal reflux disease)   . Fibromyalgia syndrome 07/26/2008    Carney Living 08/21/2019, 3:27 PM  [facility] [address] [place], [place], [address] Phone: [phone]   Fax:  [phone]  Name: Courtney Guzman MRN: [id number] Date of Birth: November 25, 1960

## 2019-08-28 ENCOUNTER — Telehealth: Payer: Self-pay | Admitting: *Deleted

## 2019-08-28 ENCOUNTER — Other Ambulatory Visit: Payer: Self-pay | Admitting: Physical Medicine and Rehabilitation

## 2019-08-28 MED ORDER — TIZANIDINE HCL 4 MG PO TABS: 4.0000 mg | ORAL_TABLET | Freq: Every evening | ORAL | 1 refills | Status: DC | PRN

## 2019-08-28 NOTE — Telephone Encounter (Signed)
Sent - thank you

## 2019-08-28 NOTE — Telephone Encounter (Signed)
Courtney Guzman called and her tizanidine was changed to capsul at last fill and her insurance requires her to use the tablet form for the lowest tier pricing.  She asks that it be changed to tablet and not capsule.

## 2019-08-31 ENCOUNTER — Other Ambulatory Visit: Payer: Self-pay

## 2019-08-31 ENCOUNTER — Ambulatory Visit: Payer: 59 | Admitting: Physical Therapy

## 2019-08-31 ENCOUNTER — Encounter: Payer: Self-pay | Admitting: Physical Therapy

## 2019-08-31 DIAGNOSIS — M6281 Muscle weakness (generalized): Secondary | ICD-10-CM

## 2019-08-31 DIAGNOSIS — M542 Cervicalgia: Secondary | ICD-10-CM

## 2019-08-31 DIAGNOSIS — M546 Pain in thoracic spine: Secondary | ICD-10-CM

## 2019-08-31 DIAGNOSIS — R252 Cramp and spasm: Secondary | ICD-10-CM

## 2019-08-31 NOTE — Therapy (Signed)
[place], [place], [address] Phone: [phone]   Fax:  [phone]  Physical Therapy Treatment  Patient Details  Name: Courtney Guzman MRN: [id number] Date of Birth: [birth year] Referring Provider (PT): Dr. Leeroy Cha   Encounter Date: 08/31/2019  PT End of Session - 08/31/19 1141    Visit Number  3    Number of Visits  12    Date for PT Re-Evaluation  09/29/19    Authorization Type  UHC    PT Start Time  0800    PT Stop Time  0844    PT Time Calculation (min)  44 min    Activity Tolerance  Patient tolerated treatment well    Behavior During Therapy  [facility] for tasks assessed/performed       Past Medical History:  Diagnosis Date  . Allergy    seasonal  . Arthritis   . Diabetes mellitus without complication ([place])   . Fibromyalgia    dr. Marveen Reeks  . GERD (gastroesophageal reflux disease)   . Hyperlipidemia   . Hypertension   . Wheezes    with season changes     Past Surgical History:  Procedure Laterality Date  . [place]   with BTL  . COLONOSCOPY    . COLONOSCOPY W/ POLYPECTOMY  2006   BENIGN ADENOMA   . POLYPECTOMY    . TONSILLECTOMY AND ADENOIDECTOMY      There were no vitals filed for this visit.  Subjective Assessment - 08/31/19 0804    Subjective  Patient reports her neck has been a little tiht but it is not painfu. She has been using a heating pad. She feels like it is tight. She has been doing exercises but nothing overhead.    Pertinent History  carpal tunnel surgery (90's, osteoporosis, fibromyalgia, diabetes, HTN    Limitations  House hold activities;Lifting;Other (comment)    Diagnostic tests  none recent    Patient Stated Goals  Pain control    Currently in Pain?  No/denies                        [facility] Adult PT Treatment/Exercise - 08/31/19 0001      Neck Exercises: Theraband   Shoulder Extension  20 reps;Red    Rows  20 reps;Red       Neck Exercises: Seated   Other Seated Exercise  bilateral ER x15 red; horizontal abduction x15 bilateral red; shoulder flex with abd x15 with cuing for posture. All given for HEP       Manual Therapy   Manual Therapy  Soft tissue mobilization;Manual Traction    Soft tissue mobilization  to bilateral upper traps using IASTYM     Manual Traction  gentle manual traction       Neck Exercises: Stretches   Levator Stretch  3 reps;20 seconds;Right       Trigger Point Dry Needling - 08/31/19 0001    Consent Given?  Yes    Education Handout Provided  Previously provided    Muscles Treated Head and Neck  Levator scapulae;Upper trapezius    Other Dry Needling  2 needles on the right upper trap 1 spot in the levator     Upper Trapezius Response  Twitch reponse elicited;Palpable increased muscle length    Levator Scapulae Response  Twitch response elicited;Palpable increased muscle length           PT Education - 08/31/19 [id number]  Education Details  reviewed postural exercises and purpose    Person(s) Educated  Patient    Methods  Explanation;Demonstration;Tactile cues;Verbal cues    Comprehension  Verbalized understanding;Returned demonstration;Verbal cues required;Tactile cues required          PT Long Term Goals - 08/18/19 1144      PT LONG TERM GOAL #1   Title  Pt will be able to show independence with HEP for posture, flexibility and strength    Baseline  none given on eval    Time  6    Period  Weeks    Status  New    Target Date  09/29/19      PT LONG TERM GOAL #2   Title  Pt will be able to lift weights at home (and demo) with proper cervical alignment    Time  6    Period  Weeks    Status  New    Target Date  09/29/19      PT LONG TERM GOAL #3   Title  Pt will be able to rely less on pain patches during the week (only 1-2 times per week)    Time  6    Period  Weeks    Status  New    Target Date  09/29/19      PT LONG TERM GOAL #4   Title  Pt will able to  lift, carry mod sized item at home without increasing neck pain most of the time.    Time  6    Period  Weeks    Status  New    Target Date  09/29/19            Plan - 08/31/19 1141    Clinical Impression Statement  Patient is making good progress. She had tighness in her upper trap and a trigger point in her levator. Therapy trialed TPDN to reduce spasmin in upper trap and levator won the right. Therapy also progressed ther-ex for psoterior chain. she was given 3 more exercises for home. therapy reviewed respose to needling and perfromed manaula therapy to decrease post needle soreness.    Comorbidities  osteoporosis, HTN, fibromyalgia    Examination-Participation Restrictions  Community Activity;Interpersonal Relationship;Laundry       Patient will benefit from skilled therapeutic intervention in order to improve the following deficits and impairments:  Increased fascial restricitons, Pain, Impaired sensation, Decreased mobility, Postural dysfunction, Decreased range of motion, Decreased strength, Impaired flexibility, Impaired UE functional use  Visit Diagnosis: Cervicalgia  Muscle weakness (generalized)  Pain in thoracic spine  Cramp and spasm     Problem List Patient Active Problem List   Diagnosis Date Noted  . Osteoarthritis 10/30/2018  . Iron deficiency anemia 10/30/2018  . Essential hypertension 08/11/2016  . Primary insomnia 07/01/2016  . 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 Seasonal allergic rhinitis due to pollen 03/03/2016  . Type 2 diabetes mellitus without complication, without long-term current use of insulin ([place]) 06/14/2015  . Hyperlipidemia 05/10/2014  . Family history of ovarian cancer 04/08/2012  . Osteopenia 03/22/2012  . GERD (gastroesophageal reflux disease)   . Fibromyalgia syndrome 07/26/2008    Carney Living PT DPT  08/31/2019, 1:20 PM  [facility] [address] [place],  [place], [address] Phone: [phone]   Fax:  [phone]  Name: Dasiyah Bulnes MRN: [id number] Date of Birth: [birth year]

## 2019-09-01 ENCOUNTER — Encounter: Payer: 59 | Attending: Physical Medicine and Rehabilitation | Admitting: Physical Medicine and Rehabilitation

## 2019-09-01 ENCOUNTER — Encounter: Payer: Self-pay | Admitting: Physical Medicine and Rehabilitation

## 2019-09-01 ENCOUNTER — Other Ambulatory Visit: Payer: Self-pay

## 2019-09-01 VITALS — BP 133/89 | HR 62 | Temp 98.5°F | Ht 60.0 in | Wt 175.2 lb

## 2019-09-01 DIAGNOSIS — G8929 Other chronic pain: Secondary | ICD-10-CM | POA: Insufficient documentation

## 2019-09-01 DIAGNOSIS — M542 Cervicalgia: Secondary | ICD-10-CM

## 2019-09-01 DIAGNOSIS — M797 Fibromyalgia: Secondary | ICD-10-CM | POA: Diagnosis present

## 2019-09-01 DIAGNOSIS — M25511 Pain in right shoulder: Secondary | ICD-10-CM | POA: Diagnosis present

## 2019-09-01 DIAGNOSIS — Z79891 Long term (current) use of opiate analgesic: Secondary | ICD-10-CM | POA: Diagnosis present

## 2019-09-01 DIAGNOSIS — Z5181 Encounter for therapeutic drug level monitoring: Secondary | ICD-10-CM | POA: Insufficient documentation

## 2019-09-01 DIAGNOSIS — M25512 Pain in left shoulder: Secondary | ICD-10-CM | POA: Insufficient documentation

## 2019-09-01 DIAGNOSIS — G894 Chronic pain syndrome: Secondary | ICD-10-CM | POA: Diagnosis not present

## 2019-09-01 DIAGNOSIS — F5101 Primary insomnia: Secondary | ICD-10-CM

## 2019-09-01 NOTE — Progress Notes (Signed)
Subjective:    Patient ID: Courtney Guzman, female    DOB: 08-21-1960, 59 y.o.   MRN: [id number]  HPI  Courtney Guzman is a 59 year old woman who presents with bilateral shoulder, hand, and hip pain for many years. She has fibromyalgia. She was referred by Dr. Estanislado Pandy.   Pain has improved form 10 to 4 with the initiation of physical therapy and the increased dose of Gabapentin.  The pain is is no longer sharp but still tingling and aching. The pain no longer interferes with her general activity and enjoyment of life.    She is unable to get corticosteroid shots due to her glaucoma. She believes this is a contraindication to her receiving lidocaine trigger point injections as well but will check with her PCP.    Prior history: She has osteoporosis based on her most recent bone scan.    She has a history of bilateral carpal tunnel syndrome.    Her sleep at night is fair.    She tried Lyrica before but it "messed with her mind." She was started on a medication in the Wellbutrin family and she stopped it suddenly and had bleeding. Had to be tapered off. She has also tried Aleve, Tylenol, Lidocaine patches. The Lidocaine patches really helped.    She can lift up her arms but if she lifts them too long they feel heavy. When she claps her hands she feels tingling in all her fingers.    She has tried massage before but had an issue with insurance approval.   Pain Inventory Average Pain 7 Pain Right Now 4 My pain is intermittent, tingling and aching  In the last 24 hours, has pain interfered with the following? General activity 0 Relation with others 0 Enjoyment of life 0 What TIME of day is your pain at its worst? morning evening Sleep (in general) Fair  Pain is worse with: inactivity and standing Pain improves with: rest, heat/ice and medication Relief from Meds: 5  Mobility walk without assistance how many minutes can you walk? 45 ability to climb steps?  yes do you drive?   yes  Function retired  Neuro/Psych numbness tingling  Prior Studies Any changes since last visit?  no bone scan  Physicians involved in your care Any changes since last visit?  no Rheumatologist .   Family History  Problem Relation Age of Onset  . Hypertension Mother   . Diabetes Sister   . Cancer Maternal Grandmother        OVARIAN  . Ovarian cancer Maternal Grandmother   . Colon cancer Neg Hx   . Colon polyps Neg Hx    Social History   Socioeconomic History  . Marital status: Married    Spouse name: Not on file  . Number of children: Not on file  . Years of education: Not on file  . Highest education level: Not on file  Occupational History  . Not on file  Tobacco Use  . Smoking status: Never Smoker  . Smokeless tobacco: Never Used  Substance and Sexual Activity  . Alcohol use: No    Alcohol/week: 0.0 standard drinks  . Drug use: No  . Sexual activity: Yes    Partners: Male    Birth control/protection: Surgical    Comment: TUBAL LIGATION, 1st sexual encounter at 56, has had 5   Other Topics Concern  . Not on file  Social History Narrative  . Not on file   Social Determinants of Health   Financial  Resource Strain:   . Difficulty of Paying Living Expenses:   Food Insecurity:   . Worried About Charity fundraiser in the Last Year:   . Arboriculturist in the Last Year:   Transportation Needs:   . Film/video editor (Medical):   Marland Kitchen Lack of Transportation (Non-Medical):   Physical Activity:   . Days of Exercise per Week:   . Minutes of Exercise per Session:   Stress:   . Feeling of Stress :   Social Connections:   . Frequency of Communication with Friends and Family:   . Frequency of Social Gatherings with Friends and Family:   . Attends Religious Services:   . Active Member of Clubs or Organizations:   . Attends Archivist Meetings:   Marland Kitchen Marital Status:    Past Surgical History:  Procedure Laterality Date  . [place]   with BTL  . COLONOSCOPY    . COLONOSCOPY W/ POLYPECTOMY  2006   BENIGN ADENOMA   . POLYPECTOMY    . TONSILLECTOMY AND ADENOIDECTOMY     Past Medical History:  Diagnosis Date  . Allergy    seasonal  . Arthritis   . Diabetes mellitus without complication ([place])   . Fibromyalgia    dr. Marveen Reeks  . GERD (gastroesophageal reflux disease)   . Hyperlipidemia   . Hypertension   . Wheezes    with season changes    BP 133/89   Pulse 62   Temp 98.5 F (36.9 C)   Ht 5' (1.524 m)   Wt 175 lb 3.2 oz (79.5 kg)   LMP 02/24/2008   SpO2 96%   BMI 34.22 kg/m   Opioid Risk Score:   Fall Risk Score:  `1  Depression screen PHQ 2/9  Depression screen [facility] 2/9 08/04/2019 10/30/2018 08/17/2017 08/11/2016 03/03/2016 06/04/2015 03/01/2015  Decreased Interest 1 0 0 0 0 0 0  Down, Depressed, Hopeless 1 0 0 0 0 0 0  PHQ - 2 Score 2 0 0 0 0 0 0  Altered sleeping 3 - - - - - -  Tired, decreased energy 1 - - - - - -  Change in appetite 0 - - - - - -  Feeling bad or failure about yourself  0 - - - - - -  Trouble concentrating 0 - - - - - -  Moving slowly or fidgety/restless 0 - - - - - -  Suicidal thoughts 0 - - - - - -  PHQ-9 Score 6 - - - - - -  Difficult doing work/chores Somewhat difficult - - - - - -     Review of Systems  Neurological: Positive for numbness.       Tinglung  All other systems reviewed and are negative.      Objective:   Physical Exam Gen: no distress, normal appearing HEENT: oral mucosa pink and moist, NCAT Cardio: Reg rate Chest: normal effort, normal rate of breathing Abd: soft, non-distended Ext: no edema Skin: intact Neuro: AOx3 Musculoskeletal: 5/5 strength throughout.  Sensation impaired throughout right side.  Protracted posture. Tender to palpation in muscles or upper back and neck, worse on right side.  Psych: pleasant, normal affect       Assessment & Plan:  Courtney Guzman is a 59 year old woman who presents with bilateral shoulder, hand,  and hip pain for many years. She has fibromyalgia.   --Maintain Gabapentin to 300mg  TID which has reduced her pain  from a 10 to a 4. She will call as needed for refill.   --Maintain Tizanidine 4mg  HS prn for spasm.   --Continue physical therapy for postural correction, stretching, strengthening of the muscles of neck and upper back, HEP   --Continue heat therapy at home.    --Continue Lidocaine patch.     All questions answered. RTC in 2 months.

## 2019-09-11 ENCOUNTER — Ambulatory Visit: Payer: 59 | Admitting: Physical Therapy

## 2019-09-11 ENCOUNTER — Encounter: Payer: Self-pay | Admitting: Physical Therapy

## 2019-09-11 ENCOUNTER — Other Ambulatory Visit: Payer: Self-pay

## 2019-09-11 DIAGNOSIS — M542 Cervicalgia: Secondary | ICD-10-CM

## 2019-09-11 DIAGNOSIS — M546 Pain in thoracic spine: Secondary | ICD-10-CM

## 2019-09-11 DIAGNOSIS — M6281 Muscle weakness (generalized): Secondary | ICD-10-CM

## 2019-09-11 DIAGNOSIS — R252 Cramp and spasm: Secondary | ICD-10-CM

## 2019-09-11 NOTE — Therapy (Signed)
[place], [place], [address] Phone: [phone]   Fax:  [phone]  Physical Therapy Treatment  Patient Details  Name: Courtney Guzman MRN: [id number] Date of Birth: August 18, 1960 Referring Provider (PT): Dr. Leeroy Cha   Encounter Date: 09/11/2019  PT End of Session - 09/11/19 1009    Visit Number  4    Number of Visits  12    Date for PT Re-Evaluation  09/29/19    Authorization Type  [employer]    PT Start Time  0800    PT Stop Time  0841    PT Time Calculation (min)  41 min    Activity Tolerance  Patient tolerated treatment well    Behavior During Therapy  [facility] for tasks assessed/performed       Past Medical History:  Diagnosis Date  . Allergy    seasonal  . Arthritis   . Diabetes mellitus without complication ([place])   . Fibromyalgia    dr. Marveen Reeks  . GERD (gastroesophageal reflux disease)   . Hyperlipidemia   . Hypertension   . Wheezes    with season changes     Past Surgical History:  Procedure Laterality Date  . [place]   with BTL  . COLONOSCOPY    . COLONOSCOPY W/ POLYPECTOMY  2006   BENIGN ADENOMA   . POLYPECTOMY    . TONSILLECTOMY AND ADENOIDECTOMY      There were no vitals filed for this visit.  Subjective Assessment - 09/11/19 0804    Subjective  Patient reports she just has a minoramoutn of stiffness on the right side this morning.    Pertinent History  carpal tunnel surgery (90's, osteoporosis, fibromyalgia, diabetes, HTN    Limitations  House hold activities;Lifting;Other (comment)    Diagnostic tests  none recent    Patient Stated Goals  Pain control    Currently in Pain?  No/denies    Pain Type  --                        [facility] Adult PT Treatment/Exercise - 09/11/19 0001      Neck Exercises: Theraband   Shoulder Extension  20 reps;Green    Rows  20 reps;Green      Neck Exercises: Seated   Other Seated Exercise  bilateral ER x15 red;  horizontal abduction x15 bilateral red; shoulder flex with abd x15 with cuing for posture. All given for HEP       Manual Therapy   Manual Therapy  Soft tissue mobilization;Manual Traction    Manual therapy comments  skilled palpation of trigger points     Soft tissue mobilization  to bilateral upper traps using IASTYM     Manual Traction  gentle manual traction       Neck Exercises: Stretches   Upper Trapezius Stretch  3 reps;20 seconds;Right    Levator Stretch  3 reps;20 seconds;Right       Trigger Point Dry Needling - 09/11/19 0001    Consent Given?  Yes    Other Dry Needling  2 needles on the right upper trap 1 spot in the levator     Upper Trapezius Response  Twitch reponse elicited;Palpable increased muscle length    Levator Scapulae Response  Twitch response elicited;Palpable increased muscle length           PT Education - 09/11/19 1008    Education Details  reviewed HEP and symptom management  Person(s) Educated  Patient    Methods  Explanation;Demonstration;Tactile cues;Verbal cues    Comprehension  Verbalized understanding;Returned demonstration;Verbal cues required;Tactile cues required          PT Long Term Goals - 08/18/19 1144      PT LONG TERM GOAL #1   Title  Pt will be able to show independence with HEP for posture, flexibility and strength    Baseline  none given on eval    Time  6    Period  Weeks    Status  New    Target Date  09/29/19      PT LONG TERM GOAL #2   Title  Pt will be able to lift weights at home (and demo) with proper cervical alignment    Time  6    Period  Weeks    Status  New    Target Date  09/29/19      PT LONG TERM GOAL #3   Title  Pt will be able to rely less on pain patches during the week (only 1-2 times per week)    Time  6    Period  Weeks    Status  New    Target Date  09/29/19      PT LONG TERM GOAL #4   Title  Pt will able to lift, carry mod sized item at home without increasing neck pain most of the  time.    Time  6    Period  Weeks    Status  New    Target Date  09/29/19            Plan - 09/11/19 1009    Clinical Impression Statement  Patient continues to make reat progress. She is having minor ightness in the right side of her neck. She feels like the needling is helping. Therapy needled her upper trap and cervical paraspinals. Per palpation ashe had less spasming in her upper trap. She has one more visit scheduled. We will re-assess next visit.    Personal Factors and Comorbidities  Comorbidity 2;Past/Current Experience;Time since onset of injury/illness/exacerbation    Comorbidities  osteoporosis, HTN, fibromyalgia    Examination-Activity Limitations  Lift;Reach Overhead;Carry    Examination-Participation Restrictions  Community Activity;Interpersonal Relationship;Laundry    Stability/Clinical Decision Making  Stable/Uncomplicated    Clinical Decision Making  Low    PT Frequency  2x / week    PT Duration  6 weeks    PT Treatment/Interventions  ADLs/Self Care Home Management;Moist Heat;Cryotherapy;Ultrasound;Traction;Functional mobility training;Manual techniques;Neuromuscular re-education;Patient/family education;Therapeutic exercise;Dry needling;Taping;Therapeutic activities;Electrical Stimulation;Passive range of motion    PT Next Visit Plan  re-assess; FOTO, possible D/C or schedeule a few more visits    PT Home Exercise Plan  scap retraction; shoulder extension; cerivcal rotation in pain free range, upper trap stretch, levator strech    Consulted and Agree with Plan of Care  Patient       Patient will benefit from skilled therapeutic intervention in order to improve the following deficits and impairments:  Increased fascial restricitons, Pain, Impaired sensation, Decreased mobility, Postural dysfunction, Decreased range of motion, Decreased strength, Impaired flexibility, Impaired UE functional use  Visit Diagnosis: Cervicalgia  Muscle weakness (generalized)  Pain  in thoracic spine  Cramp and spasm     Problem List Patient Active Problem List   Diagnosis Date Noted  . Osteoarthritis 10/30/2018  . Iron deficiency anemia 10/30/2018  . Essential hypertension 08/11/2016  . Primary insomnia 07/01/2016  . 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 Seasonal allergic rhinitis due to pollen 03/03/2016  . Type 2 diabetes mellitus without complication, without long-term current use of insulin ([place]) 06/14/2015  . Hyperlipidemia 05/10/2014  . Family history of ovarian cancer 04/08/2012  . Osteopenia 03/22/2012  . GERD (gastroesophageal reflux disease)   . Fibromyalgia syndrome 07/26/2008    Carney Living PT DPT  09/11/2019, 10:14 AM  [facility] [address] [place], [place], [address] Phone: [phone]   Fax:  [phone]  Name: Courtney Guzman MRN: [id number] Date of Birth: 01-Aug-1960

## 2019-09-14 ENCOUNTER — Other Ambulatory Visit: Payer: Self-pay

## 2019-09-14 ENCOUNTER — Ambulatory Visit: Payer: 59 | Admitting: Physical Therapy

## 2019-09-14 ENCOUNTER — Encounter: Payer: Self-pay | Admitting: Physical Therapy

## 2019-09-14 DIAGNOSIS — R252 Cramp and spasm: Secondary | ICD-10-CM

## 2019-09-14 DIAGNOSIS — M6281 Muscle weakness (generalized): Secondary | ICD-10-CM

## 2019-09-14 DIAGNOSIS — M542 Cervicalgia: Secondary | ICD-10-CM

## 2019-09-14 DIAGNOSIS — M546 Pain in thoracic spine: Secondary | ICD-10-CM

## 2019-09-14 NOTE — Therapy (Signed)
[place], [place], [address] Phone: [phone]   Fax:  [phone]  Physical Therapy Treatment/Discharge   Patient Details  Name: Courtney Guzman MRN: [id number] Date of Birth: 28-Jul-1960 Referring Provider (PT): Dr. Leeroy Cha   Encounter Date: 09/14/2019  PT End of Session - 09/14/19 0816    Visit Number  5    Number of Visits  12    Date for PT Re-Evaluation  09/29/19    Authorization Type  UHC    PT Start Time  0801    PT Stop Time  0839    PT Time Calculation (min)  38 min    Activity Tolerance  Patient tolerated treatment well    Behavior During Therapy  [facility] for tasks assessed/performed       Past Medical History:  Diagnosis Date  . Allergy    seasonal  . Arthritis   . Diabetes mellitus without complication ([place])   . Fibromyalgia    dr. Marveen Reeks  . GERD (gastroesophageal reflux disease)   . Hyperlipidemia   . Hypertension   . Wheezes    with season changes     Past Surgical History:  Procedure Laterality Date  . [place]   with BTL  . COLONOSCOPY    . COLONOSCOPY W/ POLYPECTOMY  2006   BENIGN ADENOMA   . POLYPECTOMY    . TONSILLECTOMY AND ADENOIDECTOMY      There were no vitals filed for this visit.  Subjective Assessment - 09/14/19 0806    Subjective  Patient reports her neck has felt good. She hasn;t had any pain and the stiffness is improving. She did have groin pain yesterday.    Pertinent History  carpal tunnel surgery (90's, osteoporosis, fibromyalgia, diabetes, HTN    Limitations  House hold activities;Lifting;Other (comment)    Diagnostic tests  none recent    Currently in Pain?  No/denies                        [facility] Adult PT Treatment/Exercise - 09/14/19 0001      Self-Care   Other Self-Care Comments   reviewed use of thera-cane and wehre to purchse,, reviewed progression of weight with home exercises.       Neck Exercises:  Theraband   Shoulder Extension  20 reps;Blue    Rows  20 reps;Blue      Neck Exercises: Seated   Other Seated Exercise  bilateral ER x15 red; horizontal abduction x15 bilateral red; shoulder flex with abd x15 with cuing for posture. All given for HEP       Neck Exercises: Stretches   Upper Trapezius Stretch  3 reps;20 seconds;Right    Levator Stretch  3 reps;20 seconds;Right    Other Neck Stretches  door way stretch              PT Education - 09/14/19 0807    Education Details  reviewed final HEP and symptom management    Person(s) Educated  Patient          PT Long Term Goals - 09/14/19 6962      PT LONG TERM GOAL #1   Title  Pt will be able to show independence with HEP for posture, flexibility and strength    Baseline  perfroming exercises at home    Time  6    Period  Weeks    Status  On-going  PT LONG TERM GOAL #2   Title  Pt will be able to lift weights at home (and demo) with proper cervical alignment    Baseline  perfroming at home. safely    Time  6    Period  Weeks    Status  Achieved      PT LONG TERM GOAL #3   Title  Pt will be able to rely less on pain patches during the week (only 1-2 times per week)    Baseline  No pain pateches    Time  6    Period  Weeks    Status  Achieved      PT LONG TERM GOAL #4   Title  Pt will able to lift, carry mod sized item at home without increasing neck pain most of the time.    Baseline  carrying items without pain    Time  6    Period  Weeks    Status  Achieved            Plan - 09/14/19 0817    Clinical Impression Statement  Patient has made good progreess. She has full range of motion and just minor tightness from time to time. She i s prerforming all functional activity without pain. She repsonded well to trigger poitn dry needling. Therapy reviewdd her complete program. She is ready for discharge.    Personal Factors and Comorbidities  Comorbidity 2;Past/Current Experience;Time since onset of  injury/illness/exacerbation    Comorbidities  osteoporosis, HTN, fibromyalgia    Examination-Activity Limitations  Lift;Reach Overhead;Carry    Examination-Participation Restrictions  Community Activity;Interpersonal Relationship;Laundry    Clinical Decision Making  Low    Rehab Potential  Excellent    PT Frequency  2x / week    PT Duration  6 weeks    PT Treatment/Interventions  ADLs/Self Care Home Management;Moist Heat;Cryotherapy;Ultrasound;Traction;Functional mobility training;Manual techniques;Neuromuscular re-education;Patient/family education;Therapeutic exercise;Dry needling;Taping;Therapeutic activities;Electrical Stimulation;Passive range of motion    PT Next Visit Plan  re-assess; FOTO, possible D/C or schedeule a few more visits    PT Home Exercise Plan  scap retraction; shoulder extension; cerivcal rotation in pain free range, upper trap stretch, levator strech    Consulted and Agree with Plan of Care  Patient       Patient will benefit from skilled therapeutic intervention in order to improve the following deficits and impairments:  Increased fascial restricitons, Pain, Impaired sensation, Decreased mobility, Postural dysfunction, Decreased range of motion, Decreased strength, Impaired flexibility, Impaired UE functional use  Visit Diagnosis: Cervicalgia  Muscle weakness (generalized)  Pain in thoracic spine  Cramp and spasm  PHYSICAL THERAPY DISCHARGE SUMMARY  Visits from Start of Care: 5  Current functional level related to goals / functional outcomes: Improved pain. No pain with functional activity   Remaining deficits: Mild tightness at times    Education / Equipment: HEP   Plan: Patient agrees to discharge.  Patient goals were met. Patient is being discharged due to meeting the stated rehab goals.  ?????       Problem List Patient Active Problem List   Diagnosis Date Noted  . Osteoarthritis 10/30/2018  . Iron deficiency anemia 10/30/2018  .  Essential hypertension 08/11/2016  . Primary insomnia 07/01/2016  . 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 Seasonal allergic rhinitis due to pollen 03/03/2016  . Type 2 diabetes mellitus without complication, without long-term current use of insulin ([place]) 06/14/2015  . Hyperlipidemia 05/10/2014  . Family history of ovarian cancer 04/08/2012  .  Osteopenia 03/22/2012  . GERD (gastroesophageal reflux disease)   . Fibromyalgia syndrome 07/26/2008    Carney Living 09/14/2019, 8:44 AM  [facility] [address] [place], [place], [address] Phone: [phone]   Fax:  [phone]  Name: Michaella Imai MRN: [id number] Date of Birth: October 27, 1960

## 2019-09-20 ENCOUNTER — Other Ambulatory Visit: Payer: Self-pay | Admitting: Internal Medicine

## 2019-10-16 ENCOUNTER — Other Ambulatory Visit: Payer: Self-pay | Admitting: Internal Medicine

## 2019-10-16 DIAGNOSIS — J4520 Mild intermittent asthma, uncomplicated: Secondary | ICD-10-CM

## 2019-10-16 NOTE — Telephone Encounter (Signed)
Courtney Guzman is ready for her new Gabapentin prescription. For the increased dosage.  Please send new Rx to [facility] in chart.   Thank you.

## 2019-10-17 ENCOUNTER — Other Ambulatory Visit: Payer: Self-pay | Admitting: Internal Medicine

## 2019-10-20 ENCOUNTER — Other Ambulatory Visit: Payer: Self-pay | Admitting: Internal Medicine

## 2019-10-24 ENCOUNTER — Other Ambulatory Visit: Payer: Self-pay | Admitting: Physical Medicine and Rehabilitation

## 2019-10-24 ENCOUNTER — Telehealth: Payer: Self-pay

## 2019-10-24 MED ORDER — GABAPENTIN 300 MG PO CAPS: 300.0000 mg | ORAL_CAPSULE | Freq: Three times a day (TID) | ORAL | 2 refills | Status: DC

## 2019-10-24 NOTE — Telephone Encounter (Signed)
Sent!

## 2019-10-24 NOTE — Telephone Encounter (Signed)
Patient is out of Gabapentin. Per patient new Rx should be Gabapentin 300 MG TID. Please send Rx as soon as possible because her other MD will not send.   Thank you.

## 2019-10-31 ENCOUNTER — Encounter: Payer: Self-pay | Admitting: Physical Medicine and Rehabilitation

## 2019-10-31 ENCOUNTER — Encounter: Payer: 59 | Attending: Physical Medicine and Rehabilitation | Admitting: Physical Medicine and Rehabilitation

## 2019-10-31 ENCOUNTER — Other Ambulatory Visit: Payer: Self-pay

## 2019-10-31 VITALS — BP 134/90 | HR 68 | Temp 98.5°F | Ht 60.0 in | Wt 175.0 lb

## 2019-10-31 DIAGNOSIS — M25512 Pain in left shoulder: Secondary | ICD-10-CM | POA: Diagnosis present

## 2019-10-31 DIAGNOSIS — G8929 Other chronic pain: Secondary | ICD-10-CM | POA: Diagnosis present

## 2019-10-31 DIAGNOSIS — M542 Cervicalgia: Secondary | ICD-10-CM

## 2019-10-31 DIAGNOSIS — Z5181 Encounter for therapeutic drug level monitoring: Secondary | ICD-10-CM | POA: Insufficient documentation

## 2019-10-31 DIAGNOSIS — G894 Chronic pain syndrome: Secondary | ICD-10-CM | POA: Insufficient documentation

## 2019-10-31 DIAGNOSIS — M25511 Pain in right shoulder: Secondary | ICD-10-CM | POA: Insufficient documentation

## 2019-10-31 DIAGNOSIS — Z79891 Long term (current) use of opiate analgesic: Secondary | ICD-10-CM | POA: Insufficient documentation

## 2019-10-31 DIAGNOSIS — M797 Fibromyalgia: Secondary | ICD-10-CM | POA: Diagnosis present

## 2019-10-31 NOTE — Progress Notes (Signed)
Subjective:    Patient ID: Courtney Guzman, female    DOB: 11/07/1960, 59 y.o.   MRN: [id number]  HPI  Courtney Guzman is a 59 year old woman who presents with bilateral shoulder, hand, and hip pain for many years. She has fibromyalgia. She was referred by Dr. Estanislado Pandy.   Pain has improved with the initiation of physical therapy and the increased dose of Gabapentin.  She has completed physical therapy and is diligent with her home exercise program. She was told by her therapist about a Gentry Fitz but could not remember the name of it. Feels that her cervical muscles have gotten really tight in the past couple of days. She had been getting dry needling in physical therapy and that was helpful. The pain is is no longer sharp but still tingling and aching. The pain no longer interferes with her general activity and enjoyment of life. She remains on 300mg  Gabapentin TID.    She is unable to get corticosteroid shots due to her glaucoma.  Prior history: She has osteoporosis based on her most recent bone scan.    She has a history of bilateral carpal tunnel syndrome s/p surgery. Currently has numbness and tingling in both hands which could be related to new carpal tunnel syndrome. But pain is not worse in the morning. She does not have to shake out her hands for relief.    Her sleep at night is fair.    She tried Lyrica before but it "messed with her mind." She was started on a medication in the Wellbutrin family and she stopped it suddenly and had bleeding. Had to be tapered off. She has also tried Aleve, Tylenol, Lidocaine patches. The Lidocaine patches really helped.    She can lift up her arms but if she lifts them too long they feel heavy. When she claps her hands she feels tingling in all her fingers.    She has tried massage before but had an issue with insurance approval.   Pain Inventory Average Pain 8 Pain Right Now 5 My pain is intermittent, tingling and aching  In the last 24 hours, has  pain interfered with the following? General activity 0 Relation with others 0 Enjoyment of life 0 What TIME of day is your pain at its worst? evening Sleep (in general) Fair  Pain is worse with: inactivity Pain improves with: rest, heat/ice, therapy/exercise, medication and injections Relief from Meds: 5  Mobility walk without assistance how many minutes can you walk? 45 ability to climb steps?  yes do you drive?  yes Do you have any goals in this area?  yes  Function retired  Neuro/Psych numbness tingling  Prior Studies Any changes since last visit?  no  Physicians involved in your care Any changes since last visit?  no   Family History  Problem Relation Age of Onset  . Hypertension Mother   . Diabetes Sister   . Cancer Maternal Grandmother        OVARIAN  . Ovarian cancer Maternal Grandmother   . Colon cancer Neg Hx   . Colon polyps Neg Hx    Social History   Socioeconomic History  . Marital status: Married    Spouse name: Not on file  . Number of children: Not on file  . Years of education: Not on file  . Highest education level: Not on file  Occupational History  . Not on file  Tobacco Use  . Smoking status: Never Smoker  . Smokeless tobacco: Never  Used  Vaping Use  . Vaping Use: Never used  Substance and Sexual Activity  . Alcohol use: No    Alcohol/week: 0.0 standard drinks  . Drug use: No  . Sexual activity: Yes    Partners: Male    Birth control/protection: Surgical    Comment: TUBAL LIGATION, 1st sexual encounter at 49, has had 5   Other Topics Concern  . Not on file  Social History Narrative  . Not on file   Social Determinants of Health   Financial Resource Strain:   . Difficulty of Paying Living Expenses:   Food Insecurity:   . Worried About Charity fundraiser in the Last Year:   . Arboriculturist in the Last Year:   Transportation Needs:   . Film/video editor (Medical):   Marland Kitchen Lack of Transportation (Non-Medical):     Physical Activity:   . Days of Exercise per Week:   . Minutes of Exercise per Session:   Stress:   . Feeling of Stress :   Social Connections:   . Frequency of Communication with Friends and Family:   . Frequency of Social Gatherings with Friends and Family:   . Attends Religious Services:   . Active Member of Clubs or Organizations:   . Attends Archivist Meetings:   Marland Kitchen Marital Status:    Past Surgical History:  Procedure Laterality Date  . [place]   with BTL  . COLONOSCOPY    . COLONOSCOPY W/ POLYPECTOMY  2006   BENIGN ADENOMA   . POLYPECTOMY    . TONSILLECTOMY AND ADENOIDECTOMY     Past Medical History:  Diagnosis Date  . Allergy    seasonal  . Arthritis   . Diabetes mellitus without complication ([place])   . Fibromyalgia    dr. Marveen Reeks  . GERD (gastroesophageal reflux disease)   . Hyperlipidemia   . Hypertension   . Wheezes    with season changes    BP 134/90   Pulse 68   Temp 98.5 F (36.9 C)   Ht 5' (1.524 m)   Wt 175 lb (79.4 kg)   LMP 02/24/2008   SpO2 96%   BMI 34.18 kg/m   Opioid Risk Score:   Fall Risk Score:  `1  Depression screen PHQ 2/9  Depression screen [facility] 2/9 08/04/2019 10/30/2018 08/17/2017 08/11/2016 03/03/2016 06/04/2015 03/01/2015  Decreased Interest 1 0 0 0 0 0 0  Down, Depressed, Hopeless 1 0 0 0 0 0 0  PHQ - 2 Score 2 0 0 0 0 0 0  Altered sleeping 3 - - - - - -  Tired, decreased energy 1 - - - - - -  Change in appetite 0 - - - - - -  Feeling bad or failure about yourself  0 - - - - - -  Trouble concentrating 0 - - - - - -  Moving slowly or fidgety/restless 0 - - - - - -  Suicidal thoughts 0 - - - - - -  PHQ-9 Score 6 - - - - - -  Difficult doing work/chores Somewhat difficult - - - - - -     Review of Systems  Constitutional: Negative.   HENT: Negative.   Eyes: Negative.   Respiratory: Negative.   Cardiovascular: Negative.   Gastrointestinal: Negative.   Musculoskeletal: Positive for arthralgias.   Skin: Negative.   Allergic/Immunologic: Negative.   Neurological: Positive for numbness.  Tinglung  Psychiatric/Behavioral: Negative.   All other systems reviewed and are negative.      Objective:   Physical Exam Gen: no distress, normal appearing HEENT: oral mucosa pink and moist, NCAT Cardio: Reg rate Chest: normal effort, normal rate of breathing Abd: soft, non-distended Ext: no edema Skin: intact Neuro: AOx3 Musculoskeletal: 5/5 strength throughout.  Sensation impaired throughout right side.  Posture is improved but still slightly protracted Tender to palpation in muscles or upper back and neck, worse on right side.  Psych: pleasant, normal affect        Assessment & Plan:  Courtney Guzman is a 59 year old woman who presents with bilateral shoulder, hand, and hip pain for many years. She has fibromyalgia.   --Maintain Gabapentin to 300mg  TID which has reduced her pain. If pain persists when she is running out of medication and if she remains side effect free, can increase dose to 400mg  TID.   --Maintain Tizanidine 4mg  HS prn for spasm.   --Continue HEP for postural correction, stretching, strengthening of the muscles of neck and upper back, HEP   --Continue heat therapy at home.    --Continue Lidocaine patch.  --Discussed use of Thera Cane for myofascial pain.   All questions answered. RTC in 2 months for trigger point injections.

## 2019-11-02 ENCOUNTER — Ambulatory Visit (INDEPENDENT_AMBULATORY_CARE_PROVIDER_SITE_OTHER): Payer: 59 | Admitting: Internal Medicine

## 2019-11-02 ENCOUNTER — Encounter: Payer: Self-pay | Admitting: Internal Medicine

## 2019-11-02 ENCOUNTER — Other Ambulatory Visit: Payer: Self-pay

## 2019-11-02 VITALS — BP 132/86 | HR 74 | Temp 98.1°F | Ht 60.0 in | Wt 172.0 lb

## 2019-11-02 DIAGNOSIS — E7800 Pure hypercholesterolemia, unspecified: Secondary | ICD-10-CM | POA: Diagnosis not present

## 2019-11-02 DIAGNOSIS — K219 Gastro-esophageal reflux disease without esophagitis: Secondary | ICD-10-CM | POA: Diagnosis not present

## 2019-11-02 DIAGNOSIS — I10 Essential (primary) hypertension: Secondary | ICD-10-CM | POA: Diagnosis not present

## 2019-11-02 DIAGNOSIS — Z0000 Encounter for general adult medical examination without abnormal findings: Secondary | ICD-10-CM

## 2019-11-02 DIAGNOSIS — D508 Other iron deficiency anemias: Secondary | ICD-10-CM

## 2019-11-02 DIAGNOSIS — M47816 Spondylosis without myelopathy or radiculopathy, lumbar region: Secondary | ICD-10-CM

## 2019-11-02 DIAGNOSIS — M8580 Other specified disorders of bone density and structure, unspecified site: Secondary | ICD-10-CM

## 2019-11-02 DIAGNOSIS — M797 Fibromyalgia: Secondary | ICD-10-CM | POA: Diagnosis not present

## 2019-11-02 DIAGNOSIS — E119 Type 2 diabetes mellitus without complications: Secondary | ICD-10-CM

## 2019-11-02 DIAGNOSIS — F5101 Primary insomnia: Secondary | ICD-10-CM

## 2019-11-02 NOTE — Assessment & Plan Note (Signed)
C met, lipid and A1c today No urine microalbumin secondary to ACE therapy Encouraged her to consume a low carb diet and exercise for weight loss Encourage routine eye exams Foot exam today Flu and Covid up-to-date She declines Pneumovax

## 2019-11-02 NOTE — Assessment & Plan Note (Signed)
C met and lipid profile today Continue Atorvastatin Encouraged her to consume a low-fat diet 

## 2019-11-02 NOTE — Assessment & Plan Note (Signed)
Controlled on Lisinopril and Amlodipine C met today Reinforced DASH diet and exercise for weight loss

## 2019-11-02 NOTE — Assessment & Plan Note (Signed)
Continue Calcium and Vitamin D OTC Encouraged daily weightbearing exercise daily

## 2019-11-02 NOTE — Progress Notes (Signed)
Subjective:    Patient ID: Courtney Guzman, female    DOB: [birth year], 59 y.o.   MRN: [id number]  HPI  Patient presents the clinic today for her annual exam.  She is also due to follow-up chronic conditions.  OA: Mainly in her back.  She uses Lidoderm patches takes Gabapentin, Tizanidine as prescribed with good relief of symptoms. She follows with Physical Med  HTN: Her BP today is 132/86.  She is taking Lisinopril and Amlodipine as prescribed.  ECG from 07/2008 reviewed.  Fibromyalgia: She reports chronic back pain and muscle stiffness.  Managed on Lidoderm patches, Gabapentin and Tizandine.  She does not routinely exercise.  She follows with rheumatology.  GERD:  She denies breakthrough on Omeprazole.  Upper GI from 02/2010 reviewed.  HLD: Her last LDL was 86, 10/2018.  She denies myalgias on Atorvastatin.  She is taking Fish Oil OTC as well.  She tries to consume a low-fat diet.  Osteopenia: She is taking Calcium and Vitamin D OTC.  Bone density from 06/2019 reviewed.  She does not get much weightbearing exercise and daily.  Insomnia: Currently not an issue.  She is not taking any medications for this.  IDA: Her last H/H was 12.4/39.3, 06/2019.  She is taking an oral iron supplement OTC.  DM2: Her last A1c was 6.3%, 10/2018.  She is not taking any oral diabetic medication at this time.  She does not check her sugars.  She checks her feet routinely.  Flu: 02/2018 Tetanus: 02/2016 Pneumovax: never Covid: 07/2019, 08/2019 Pap smear: 10/2018 Mammogram: 05/2019 Bone density: 06/2019 Colon screening: 07/2015 Vision screening: annually Dentist: annually  Diet: She does eat meat. She consumes more veggies than fruit. She tries to avoid fried foods. She drinks mostly water, sweet tea (half/half), Dt. Ginger Ale, Dt. [place]. Exercise: Walking, abs  Review of Systems  Past Medical History:  Diagnosis Date  . Allergy    seasonal  . Arthritis   . Diabetes mellitus without complication  ([place])   . Fibromyalgia    dr. Marveen Reeks  . GERD (gastroesophageal reflux disease)   . Hyperlipidemia   . Hypertension   . Wheezes    with season changes     Current Outpatient Medications  Medication Sig Dispense Refill  . albuterol (VENTOLIN HFA) 108 (90 Base) MCG/ACT inhaler INHALE 2 PUFFS BY MOUTH EVERY 4 HOURS AS NEEDED FOR WHEEZE 3 g 3  . amLODipine (NORVASC) 10 MG tablet TAKE 1 TABLET BY MOUTH EVERY DAY 30 tablet 0  . atorvastatin (LIPITOR) 10 MG tablet Take 1 tablet (10 mg total) by mouth daily at 6 PM. 30 tablet 0  . calcium carbonate (OS-CAL) 600 MG TABS Take 600 mg by mouth 2 (two) times daily with a meal.      . desonide (DESOWEN) 0.05 % ointment as needed.     . ferrous sulfate 325 (65 FE) MG tablet Take 325 mg by mouth daily with breakfast.    . fexofenadine (ALLEGRA) 30 MG tablet Take 30 mg by mouth 2 (two) times daily.    . fish oil-omega-3 fatty acids 1000 MG capsule Take 2 g by mouth daily.    Marland Kitchen gabapentin (NEURONTIN) 300 MG capsule Take 1 capsule (300 mg total) by mouth 3 (three) times daily. 90 capsule 2  . Green Tea, Camillia sinensis, (GREEN TEA PO) Take by mouth.      . latanoprost (XALATAN) 0.005 % ophthalmic solution Place into both eyes at bedtime.     Marland Kitchen  lidocaine (LIDODERM) 5 % Place 1 patch onto the skin daily. Remove & Discard patch within 12 hours or as directed by MD 30 patch 2  . lisinopril (ZESTRIL) 20 MG tablet TAKE 1 TABLET BY MOUTH EVERYDAY AT BEDTIME 30 tablet 0  . methocarbamol (ROBAXIN) 500 MG tablet TAKE 1 TABLET BY MOUTH TWICE A DAY 60 tablet 2  . Multiple Vitamin (MULTIVITAMIN) tablet Take 1 tablet by mouth daily.    . multivitamin-lutein (OCUVITE-LUTEIN) CAPS capsule Take 1 capsule by mouth daily.    Marland Kitchen omeprazole (PRILOSEC) 40 MG capsule TAKE 1 CAPSULE BY MOUTH EVERY DAY 30 capsule 0  . timolol (TIMOPTIC) 0.5 % ophthalmic solution Place into both eyes daily.     Marland Kitchen tiZANidine (ZANAFLEX) 4 MG tablet Take 1 tablet (4 mg total) by mouth at bedtime  as needed for muscle spasms. 90 tablet 1  . Turmeric (QC TUMERIC COMPLEX PO) Take 1,000 mg by mouth daily.     No current facility-administered medications for this visit.    No Known Allergies  Family History  Problem Relation Age of Onset  . Hypertension Mother   . Diabetes Sister   . Cancer Maternal Grandmother        OVARIAN  . Ovarian cancer Maternal Grandmother   . Colon cancer Neg Hx   . Colon polyps Neg Hx     Social History   Socioeconomic History  . Marital status: Married    Spouse name: Not on file  . Number of children: Not on file  . Years of education: Not on file  . Highest education level: Not on file  Occupational History  . Not on file  Tobacco Use  . Smoking status: Never Smoker  . Smokeless tobacco: Never Used  Vaping Use  . Vaping Use: Never used  Substance and Sexual Activity  . Alcohol use: No    Alcohol/week: 0.0 standard drinks  . Drug use: No  . Sexual activity: Yes    Partners: Male    Birth control/protection: Surgical    Comment: TUBAL LIGATION, 1st sexual encounter at 61, has had 5   Other Topics Concern  . Not on file  Social History Narrative  . Not on file   Social Determinants of Health   Financial Resource Strain:   . Difficulty of Paying Living Expenses:   Food Insecurity:   . Worried About Charity fundraiser in the Last Year:   . Arboriculturist in the Last Year:   Transportation Needs:   . Film/video editor (Medical):   Marland Kitchen Lack of Transportation (Non-Medical):   Physical Activity:   . Days of Exercise per Week:   . Minutes of Exercise per Session:   Stress:   . Feeling of Stress :   Social Connections:   . Frequency of Communication with Friends and Family:   . Frequency of Social Gatherings with Friends and Family:   . Attends Religious Services:   . Active Member of Clubs or Organizations:   . Attends Archivist Meetings:   Marland Kitchen Marital Status:   Intimate Partner Violence:   . Fear of Current or  Ex-Partner:   . Emotionally Abused:   Marland Kitchen Physically Abused:   . Sexually Abused:      Constitutional: Denies fever, malaise, fatigue, headache or abrupt weight changes.  HEENT: Denies eye pain, eye redness, ear pain, ringing in the ears, wax buildup, runny nose, nasal congestion, bloody nose, or sore throat. Respiratory: Denies difficulty  breathing, shortness of breath, cough or sputum production.   Cardiovascular: Denies chest pain, chest tightness, palpitations or swelling in the hands or feet.  Gastrointestinal: Denies abdominal pain, bloating, constipation, diarrhea or blood in the stool.  GU: Denies urgency, frequency, pain with urination, burning sensation, blood in urine, odor or discharge. Musculoskeletal: Pt reports chronic muscle and joint pain. Denies decrease in range of motion, difficulty with gait, or joint swelling.  Skin: Denies redness, rashes, lesions or ulcercations.  Neurological: Denies dizziness, difficulty with memory, difficulty with speech or problems with balance and coordination.  Psych: Denies anxiety, depression, SI/HI.  No other specific complaints in a complete review of systems (except as listed in HPI above).     Objective:   Physical Exam   BP 132/86   Pulse 74   Temp 98.1 F (36.7 C) (Temporal)   Ht 5' (1.524 m)   Wt 172 lb (78 kg)   LMP 02/24/2008   SpO2 98%   BMI 33.59 kg/m   Wt Readings from Last 3 Encounters:  10/31/19 175 lb (79.4 kg)  09/01/19 175 lb 3.2 oz (79.5 kg)  08/10/19 170 lb (77.1 kg)    General: Appears her stated age, obese, in NAD. Skin: Warm, dry and intact. No ulcerations noted. HEENT: Head: normal shape and size; Eyes: sclera white, no icterus, conjunctiva pink, PERRLA and EOMs intact;  Neck:  Neck supple, trachea midline. No masses, lumps or thyromegaly present.  Cardiovascular: Normal rate and rhythm. S1,S2 noted.  No murmur, rubs or gallops noted. No JVD or BLE edema. No carotid bruits noted. Pulmonary/Chest:  Normal effort and positive vesicular breath sounds. No respiratory distress. No wheezes, rales or ronchi noted.  Abdomen: Soft and nontender. Normal bowel sounds. No distention or masses noted. Liver, spleen and kidneys non palpable. Musculoskeletal: Strength 5/5 BUE/BLE. No difficulty with gait.  Neurological: Alert and oriented. Cranial nerves II-XII grossly intact. Coordination normal.  Psychiatric: Mood and affect normal. Behavior is normal. Judgment and thought content normal.     BMET    Component Value Date/Time   NA 137 07/14/2019 0837   K 4.7 07/14/2019 0837   CL 101 07/14/2019 0837   CO2 22 07/14/2019 0837   GLUCOSE 109 (H) 07/14/2019 0837   GLUCOSE 109 (H) 01/29/2017 0840   BUN 8 07/14/2019 0837   CREATININE 0.68 07/14/2019 0837   CREATININE 0.66 04/24/2014 0859   CALCIUM 9.9 07/14/2019 0837   GFRNONAA 96 07/14/2019 0837   GFRAA 111 07/14/2019 0837    Lipid Panel     Component Value Date/Time   CHOL 165 10/27/2018 1105   TRIG 59 10/27/2018 1105   HDL 67 10/27/2018 1105   CHOLHDL 2.5 10/27/2018 1105   CHOLHDL 3 03/03/2016 0912   VLDL 14.2 03/03/2016 0912   LDLCALC 86 10/27/2018 1105    CBC    Component Value Date/Time   WBC 6.1 07/14/2019 0837   WBC 4.9 01/29/2017 0840   RBC 4.50 07/14/2019 0837   RBC 4.56 01/29/2017 0840   HGB 12.4 07/14/2019 0837   HCT 39.3 07/14/2019 0837   PLT 350 07/14/2019 0837   MCV 87 07/14/2019 0837   MCH 27.6 07/14/2019 0837   MCH 27.8 04/24/2014 0859   MCHC 31.6 07/14/2019 0837   MCHC 32.1 01/29/2017 0840   RDW 13.5 07/14/2019 0837   LYMPHSABS 2.7 07/14/2019 0837   MONOABS 0.5 04/24/2014 0859   EOSABS 0.2 07/14/2019 0837   BASOSABS 0.1 07/14/2019 0837    Hgb A1C Lab  Results  Component Value Date   HGBA1C 6.3 (H) 10/27/2018           Assessment & Plan:   Preventative health maintenance:  Encouraged her to get a flu shot in the fall Tetanus UTD She declines pneumovax Covid vaccine UTD Pap smear  UTD Mammogram UTD Bone density UTD Colon screening UTD Encouraged her to consume a balanced diet and exercise regimen Advised her to see an eye doctor and dentist annually We will check CBC, C met, lipid, A1c and vitamin D today  RTC in 6 months follow-up chronic conditions [place] Sil[place], NP This visit occurred during the SARS-CoV-2 public health emergency.  Safety protocols were in place, including screening questions prior to the visit, additional usage of staff PPE, and extensive cleaning of exam room while observing appropriate contact time as indicated for disinfecting solutions.

## 2019-11-02 NOTE — Assessment & Plan Note (Signed)
Continue Lidoderm, Gabapentin, Tizanidine She will continue to follow with physical medicine

## 2019-11-02 NOTE — Assessment & Plan Note (Signed)
Currently not an issue °We will monitor °

## 2019-11-02 NOTE — Assessment & Plan Note (Signed)
Continue Omeprazole CBC and CMET today 

## 2019-11-02 NOTE — Assessment & Plan Note (Signed)
CBC today Continue oral iron 

## 2019-11-02 NOTE — Assessment & Plan Note (Signed)
Continue Lidoderm, Gabapentin and Tizanidine She will continue to follow with rheumatology

## 2019-11-02 NOTE — Patient Instructions (Signed)

## 2019-11-03 LAB — HEMOGLOBIN A1C
Est. average glucose Bld gHb Est-mCnc: 131 mg/dL
Hgb A1c MFr Bld: 6.2 % — ABNORMAL HIGH (ref 4.8–5.6)

## 2019-11-03 LAB — CBC
Hematocrit: 39.9 % (ref 34.0–46.6)
Hemoglobin: 13 g/dL (ref 11.1–15.9)
MCH: 28.3 pg (ref 26.6–33.0)
MCHC: 32.6 g/dL (ref 31.5–35.7)
MCV: 87 fL (ref 79–97)
Platelets: 336 10*3/uL (ref 150–450)
RBC: 4.6 x10E6/uL (ref 3.77–5.28)
RDW: 13.5 % (ref 11.7–15.4)
WBC: 6.3 10*3/uL (ref 3.4–10.8)

## 2019-11-03 LAB — COMPREHENSIVE METABOLIC PANEL
ALT: 14 IU/L (ref 0–32)
AST: 21 IU/L (ref 0–40)
Albumin/Globulin Ratio: 1.8 (ref 1.2–2.2)
Albumin: 4.8 g/dL (ref 3.8–4.9)
Alkaline Phosphatase: 85 IU/L (ref 48–121)
BUN/Creatinine Ratio: 14 (ref 9–23)
BUN: 10 mg/dL (ref 6–24)
Bilirubin Total: 0.5 mg/dL (ref 0.0–1.2)
CO2: 25 mmol/L (ref 20–29)
Calcium: 9.9 mg/dL (ref 8.7–10.2)
Chloride: 105 mmol/L (ref 96–106)
Creatinine, Ser: 0.7 mg/dL (ref 0.57–1.00)
GFR calc Af Amer: 110 mL/min/{1.73_m2} (ref >59)
GFR calc non Af Amer: 95 mL/min/{1.73_m2} (ref >59)
Globulin, Total: 2.6 g/dL (ref 1.5–4.5)
Glucose: 97 mg/dL (ref 65–99)
Potassium: 5.1 mmol/L (ref 3.5–5.2)
Sodium: 142 mmol/L (ref 134–144)
Total Protein: 7.4 g/dL (ref 6.0–8.5)

## 2019-11-03 LAB — LIPID PANEL
Chol/HDL Ratio: 2.5 ratio (ref 0.0–4.4)
Cholesterol, Total: 187 mg/dL (ref 100–199)
HDL: 75 mg/dL (ref >39)
LDL Chol Calc (NIH): 99 mg/dL (ref 0–99)
Triglycerides: 69 mg/dL (ref 0–149)
VLDL Cholesterol Cal: 13 mg/dL (ref 5–40)

## 2019-11-03 LAB — VITAMIN D 25 HYDROXY (VIT D DEFICIENCY, FRACTURES): Vit D, 25-Hydroxy: 31.8 ng/mL (ref 30.0–100.0)

## 2019-11-11 ENCOUNTER — Other Ambulatory Visit: Payer: Self-pay | Admitting: Internal Medicine

## 2019-11-16 ENCOUNTER — Other Ambulatory Visit: Payer: Self-pay | Admitting: Internal Medicine

## 2019-12-14 ENCOUNTER — Ambulatory Visit: Payer: 59 | Admitting: Physician Assistant

## 2020-01-09 ENCOUNTER — Ambulatory Visit: Payer: 59 | Admitting: Physical Medicine and Rehabilitation

## 2020-01-11 ENCOUNTER — Encounter: Payer: Self-pay | Admitting: Physical Medicine and Rehabilitation

## 2020-01-11 ENCOUNTER — Encounter: Payer: 59 | Attending: Physical Medicine and Rehabilitation | Admitting: Physical Medicine and Rehabilitation

## 2020-01-11 ENCOUNTER — Other Ambulatory Visit: Payer: Self-pay

## 2020-01-11 VITALS — BP 128/88 | HR 69 | Temp 98.5°F | Ht 60.0 in | Wt 176.4 lb

## 2020-01-11 DIAGNOSIS — M25511 Pain in right shoulder: Secondary | ICD-10-CM | POA: Insufficient documentation

## 2020-01-11 DIAGNOSIS — M797 Fibromyalgia: Secondary | ICD-10-CM | POA: Insufficient documentation

## 2020-01-11 DIAGNOSIS — M25512 Pain in left shoulder: Secondary | ICD-10-CM

## 2020-01-11 DIAGNOSIS — G894 Chronic pain syndrome: Secondary | ICD-10-CM | POA: Diagnosis present

## 2020-01-11 DIAGNOSIS — Z79891 Long term (current) use of opiate analgesic: Secondary | ICD-10-CM | POA: Insufficient documentation

## 2020-01-11 DIAGNOSIS — Z5181 Encounter for therapeutic drug level monitoring: Secondary | ICD-10-CM | POA: Insufficient documentation

## 2020-01-11 DIAGNOSIS — M542 Cervicalgia: Secondary | ICD-10-CM | POA: Diagnosis not present

## 2020-01-11 DIAGNOSIS — G8929 Other chronic pain: Secondary | ICD-10-CM | POA: Diagnosis present

## 2020-01-11 DIAGNOSIS — M7918 Myalgia, other site: Secondary | ICD-10-CM

## 2020-01-11 NOTE — Progress Notes (Addendum)
Subjective:    Patient ID: Courtney Guzman, female    DOB: 1960/05/08, 59 y.o.   MRN: [id number]  HPI She has been using her heating pad, portable sauna, salonpas. She would to try trigger point injections. She is still taking Gabapentin 300mg  TID. She was not able to walk much this week due to the rain. She tries to walk 3 or 4 days per week, weather permitting. She walks 45 min to an hour. She has a nice safe trail near her house. Also likes to jog a little. [facility] [employer] [facility] three times per week. Average pain is 8 and pain right now is 6/10.   Pain Inventory Average Pain 8 Pain Right Now 6 My pain is intermittent, burning, stabbing, tingling and aching  In the last 24 hours, has pain interfered with the following? General activity 2 Relation with others 0 Enjoyment of life 0 What TIME of day is your pain at its worst? daytime and evening Sleep (in general) Fair  Pain is worse with: none Pain improves with: heat/ice and therapy/exercise Relief from Meds: 6  Family History  Problem Relation Age of Onset  . Hypertension Mother   . Diabetes Sister   . Cancer Maternal Grandmother        OVARIAN  . Ovarian cancer Maternal Grandmother   . Colon cancer Neg Hx   . Colon polyps Neg Hx    Social History   Socioeconomic History  . Marital status: Married    Spouse name: Not on file  . Number of children: Not on file  . Years of education: Not on file  . Highest education level: Not on file  Occupational History  . Not on file  Tobacco Use  . Smoking status: Never Smoker  . Smokeless tobacco: Never Used  Vaping Use  . Vaping Use: Never used  Substance and Sexual Activity  . Alcohol use: No    Alcohol/week: 0.0 standard drinks  . Drug use: No  . Sexual activity: Yes    Partners: Male    Birth control/protection: Surgical    Comment: TUBAL LIGATION, 1st sexual encounter at 58, has had 5   Other Topics Concern  . Not on file  Social  History Narrative  . Not on file   Social Determinants of Health   Financial Resource Strain:   . Difficulty of Paying Living Expenses: Not on file  Food Insecurity:   . Worried About Charity fundraiser in the Last Year: Not on file  . Ran Out of Food in the Last Year: Not on file  Transportation Needs:   . Lack of Transportation (Medical): Not on file  . Lack of Transportation (Non-Medical): Not on file  Physical Activity:   . Days of Exercise per Week: Not on file  . Minutes of Exercise per Session: Not on file  Stress:   . Feeling of Stress : Not on file  Social Connections:   . Frequency of Communication with Friends and Family: Not on file  . Frequency of Social Gatherings with Friends and Family: Not on file  . Attends Religious Services: Not on file  . Active Member of Clubs or Organizations: Not on file  . Attends Archivist Meetings: Not on file  . Marital Status: Not on file   Past Surgical History:  Procedure Laterality Date  . [place]   with BTL  . COLONOSCOPY    .  COLONOSCOPY W/ POLYPECTOMY  2006   BENIGN ADENOMA   . POLYPECTOMY    . TONSILLECTOMY AND ADENOIDECTOMY     Past Surgical History:  Procedure Laterality Date  . [place]   with BTL  . COLONOSCOPY    . COLONOSCOPY W/ POLYPECTOMY  2006   BENIGN ADENOMA   . POLYPECTOMY    . TONSILLECTOMY AND ADENOIDECTOMY     Past Medical History:  Diagnosis Date  . Allergy    seasonal  . Arthritis   . Diabetes mellitus without complication ([place])   . Fibromyalgia    dr. Marveen Reeks  . GERD (gastroesophageal reflux disease)   . Hyperlipidemia   . Hypertension   . Wheezes    with season changes    BP 128/88   Pulse 69   Temp 98.5 F (36.9 C)   Ht 5' (1.524 m)   Wt 176 lb 6.4 oz (80 kg)   LMP 02/24/2008   SpO2 98%   BMI 34.45 kg/m   Opioid Risk Score:   Fall Risk Score:  `1  Depression screen PHQ 2/9  Depression screen [facility] 2/9 01/11/2020 11/02/2019  08/04/2019 10/30/2018 08/17/2017 08/11/2016 03/03/2016  Decreased Interest 0 0 1 0 0 0 0  Down, Depressed, Hopeless 0 0 1 0 0 0 0  PHQ - 2 Score 0 0 2 0 0 0 0  Altered sleeping - - 3 - - - -  Tired, decreased energy - - 1 - - - -  Change in appetite - - 0 - - - -  Feeling bad or failure about yourself  - - 0 - - - -  Trouble concentrating - - 0 - - - -  Moving slowly or fidgety/restless - - 0 - - - -  Suicidal thoughts - - 0 - - - -  PHQ-9 Score - - 6 - - - -  Difficult doing work/chores - - Somewhat difficult - - - -   Review of Systems  Cardiovascular: Positive for chest pain.  Musculoskeletal: Positive for back pain.  Neurological:       Tingling  All other systems reviewed and are negative.      Objective:   Physical Exam Gen: no distress, normal appearing HEENT: oral mucosa pink and moist, NCAT Cardio: Reg rate Chest: normal effort, normal rate of breathing Abd: soft, non-distended Ext: no edema Skin: intact Neuro: Alert and oriented x3 Musculoskeletal: tight cervical paraspinals and trapezius bilaterally.  Psych: pleasant, normal affect     Assessment & Plan:  Courtney Guzman is a 59 year old woman who presents for follow-up of cervical myofascial pain syndrome and fibromyalgia.  1) Chronic Pain Syndrome secondary to fibromyalgia.  -Discussed current symptoms of pain and history of pain.  -Discussed benefits of exercise in reducing pain. -Discussed following foods that may reduce pain: 1) Ginger 2) Blueberries 3) Salmon 4) Pumpkin seeds 5) dark chocolate 6) turmeric 7) tart cherries 8) virgin olive oil 9) chilli peppers 10) mint 11) red wine -Made goal to incorporate some of these foods in her diet.  -She does not require refill of Gabapentin- continue taking 300mg  TID.  -Continue resistance training exercises.   2) Cervical myofascial pain syndrome -Will schedule trigger point injections for next session.

## 2020-01-11 NOTE — Patient Instructions (Signed)
1) Ginger 2) Blueberries 3) Salmon 4) Pumpkin seeds 5) dark chocolate 6) turmeric 7) tart cherries 8) virgin olive oil 9) chilli peppers 10) mint 11) red wine  Turmeric to reduce inflammation--can be used in cooking or taken as a supplement.  Turmeric Milk Recipe:  1 cup milk  1 tsp turmeric  1 tsp cinnamon  1 tsp grated ginger (optional)  Black pepper (boosts the anti-inflammatory properties of turmeric).  1 tsp honey

## 2020-01-28 ENCOUNTER — Other Ambulatory Visit: Payer: Self-pay | Admitting: Rheumatology

## 2020-01-28 ENCOUNTER — Other Ambulatory Visit: Payer: Self-pay | Admitting: Physical Medicine and Rehabilitation

## 2020-05-02 ENCOUNTER — Telehealth: Payer: Self-pay | Admitting: Pharmacist

## 2020-05-02 ENCOUNTER — Other Ambulatory Visit: Payer: Self-pay | Admitting: Internal Medicine

## 2020-05-02 DIAGNOSIS — Z1231 Encounter for screening mammogram for malignant neoplasm of breast: Secondary | ICD-10-CM

## 2020-05-02 NOTE — Telephone Encounter (Signed)
Please a schedule a follow-up appointment for patient.  She will need labs prior to her Reclast infusion as well.

## 2020-05-02 NOTE — Telephone Encounter (Signed)
Received call from patient regarding Reclast infusion. Last infusion was 08/10/19.  Last seen in clinic in March 2021 by Dr. Estanislado Pandy and no f/u scheduled.  Dr. Estanislado Pandy - are you okay with penciling her into your schedule prior to sending new orders for Reclast? She'll need updated CBC/CMP within a month prior to Reclast infusion.  Knox Saliva, PharmD, MPH Clinical Pharmacist (Rheumatology and Pulmonology)

## 2020-05-02 NOTE — Telephone Encounter (Signed)
Attempted to contact and left message to advise patient she needs to schedule a follow up appointment and she is due for labs.

## 2020-05-02 NOTE — Telephone Encounter (Signed)
Patient scheduled for a follow up visit and labs on 07/18/2020.

## 2020-05-09 ENCOUNTER — Telehealth: Payer: Self-pay | Admitting: Physical Medicine and Rehabilitation

## 2020-05-09 NOTE — Telephone Encounter (Signed)
Will call her right now

## 2020-05-09 NOTE — Telephone Encounter (Signed)
We called to reschedule her apt for 2/10 due to rounds schedule at the hospital. Patient states this is the second time this apt has be rescheduled and she would like to cancel for now, and would like a call from the doctor to see what is going on. She states she would call and make apt later.

## 2020-05-12 ENCOUNTER — Other Ambulatory Visit: Payer: Self-pay | Admitting: Physical Medicine and Rehabilitation

## 2020-05-14 ENCOUNTER — Ambulatory Visit: Payer: 59 | Admitting: Physical Medicine and Rehabilitation

## 2020-05-17 ENCOUNTER — Other Ambulatory Visit: Payer: Self-pay | Admitting: Internal Medicine

## 2020-05-21 ENCOUNTER — Telehealth: Payer: Self-pay | Admitting: *Deleted

## 2020-05-21 NOTE — Telephone Encounter (Signed)
Could be a pinched nerve or neuropathic pain of some sort. Recommend in office eval

## 2020-05-21 NOTE — Telephone Encounter (Signed)
Patient called stating that she has been having a warm/hot sensation in her left upper thigh that has been happening off and on for several weeks. Patient stated that it feels like a hot flash. Patient denies any redness or swelling of the thigh or leg. Patient was offered an appointment which she declined. Patient requested that the message go back to Webb Silversmith NP to see if she has any ideas as to what may be causing this.

## 2020-05-28 NOTE — Telephone Encounter (Signed)
Left message on voicemail.

## 2020-05-30 ENCOUNTER — Ambulatory Visit: Payer: 59 | Admitting: Physical Medicine and Rehabilitation

## 2020-06-04 ENCOUNTER — Telehealth: Payer: Self-pay

## 2020-06-04 NOTE — Telephone Encounter (Signed)
Attempted to contact the patient and left message for patient to call the office.   Patient will need labs within 1 month of Reclast infusion. Orders will be placed after labs are completed. Patient will call [phone] after orders are placed to schedule infusion.

## 2020-06-04 NOTE — Telephone Encounter (Signed)
Patient states she needs her Reclast infusion to be scheduled on either Friday, April 29 or Friday, May 6.  Patient scheduled her follow-up appointment  on 07/22/20 with Lovena Le.

## 2020-06-05 NOTE — Telephone Encounter (Signed)
Patient advised she will need labs within 1 month of Reclast infusion. Orders will be placed after labs are completed. Patient will call [phone] after orders are placed to schedule infusion.  Patient expressed understanding.

## 2020-06-05 NOTE — Telephone Encounter (Signed)
Attempted to contact the patient and left message for patient to call the office.  

## 2020-06-24 ENCOUNTER — Ambulatory Visit
Admission: RE | Admit: 2020-06-24 | Discharge: 2020-06-24 | Disposition: A | Discharge status: Home / Self Care | Payer: 59 | Source: Ambulatory Visit | Attending: Internal Medicine | Admitting: Internal Medicine

## 2020-06-24 ENCOUNTER — Other Ambulatory Visit: Payer: Self-pay

## 2020-06-24 DIAGNOSIS — Z1231 Encounter for screening mammogram for malignant neoplasm of breast: Secondary | ICD-10-CM

## 2020-07-01 ENCOUNTER — Other Ambulatory Visit: Payer: Self-pay

## 2020-07-01 ENCOUNTER — Encounter: Payer: 59 | Attending: Physical Medicine and Rehabilitation | Admitting: Physical Medicine and Rehabilitation

## 2020-07-01 ENCOUNTER — Encounter: Payer: Self-pay | Admitting: Physical Medicine and Rehabilitation

## 2020-07-01 VITALS — Ht 60.0 in | Wt 169.0 lb

## 2020-07-01 DIAGNOSIS — M7918 Myalgia, other site: Secondary | ICD-10-CM | POA: Diagnosis present

## 2020-07-01 NOTE — Progress Notes (Signed)
Trigger Point Injection  Indication: Cervical myofascial pain not relieved by medication management and other conservative care.  Informed consent was obtained after describing risk and benefits of the procedure with the patient, this includes bleeding, bruising, infection and medication side effects.  The patient wishes to proceed and has given written consent.  The patient was placed in a seated position.  The cervical area was marked and prepped with Betadine.  It was entered with a 25-gauge 1/2 inch needle and a total of 5 mL of 1% lidocaine and normal saline was injected into a total of 4 trigger points, after negative draw back for blood.  The patient tolerated the procedure well.  Post procedure instructions were given. 

## 2020-07-01 NOTE — Patient Instructions (Addendum)
[email or web address]  Discussed following foods that may reduce pain: 1) Ginger 2) Blueberries 3) Salmon 4) Pumpkin seeds 5) dark chocolate 6) turmeric 7) tart cherries 8) virgin olive oil 9) chilli peppers 10) mint 11) red wine 12) garlic  Link to further information on diet for chronic pain: [email or web address]

## 2020-07-10 ENCOUNTER — Telehealth: Payer: Self-pay | Admitting: Internal Medicine

## 2020-07-10 NOTE — Telephone Encounter (Signed)
Fine with me. Really nice lady!

## 2020-07-10 NOTE — Telephone Encounter (Signed)
Patient would like a TOC from Webb Silversmith NP to Jodi Mourning NP.    Please advise

## 2020-07-12 ENCOUNTER — Telehealth: Payer: Self-pay

## 2020-07-12 NOTE — Telephone Encounter (Signed)
Courtney Guzman is interested in the [employer] [facility]. Patient has contacted her [facility] and would like to proceed.

## 2020-07-12 NOTE — Telephone Encounter (Signed)
Please let her know I have contacted Camie Patience, our representative to get an idea of what cost would be for her and I will email him today to contact her about her interest!

## 2020-07-12 NOTE — Telephone Encounter (Signed)
Both phone numbers called. Not able to leave a voice message/

## 2020-07-15 NOTE — Telephone Encounter (Signed)
Please make sure she is aware that I am transferring from [facility] location to [facility] location at end of April. If she is okay with this, fine with transfer.

## 2020-07-16 ENCOUNTER — Other Ambulatory Visit: Payer: Self-pay | Admitting: Internal Medicine

## 2020-07-18 ENCOUNTER — Ambulatory Visit: Payer: 59 | Admitting: Rheumatology

## 2020-07-22 ENCOUNTER — Ambulatory Visit: Payer: 59 | Admitting: Physician Assistant

## 2020-07-24 ENCOUNTER — Telehealth: Payer: Self-pay | Admitting: *Deleted

## 2020-07-24 NOTE — Telephone Encounter (Signed)
Notified. 

## 2020-07-24 NOTE — Telephone Encounter (Signed)
Please let her know I have completed and submitted this form for her!

## 2020-07-24 NOTE — Telephone Encounter (Signed)
Courtney Guzman called back and said that she was told by the person she talked about for [facility] said that the physicians office has to fill out a form with the codes on it to submit to [facility] to see if it will be approved.

## 2020-07-26 NOTE — Progress Notes (Signed)
Office Visit Note  Patient: Courtney Guzman             Date of Birth: 12-Jul-1960           MRN: [id number]             PCP: Jearld Fenton, NP Referring: Jearld Fenton, NP Visit Date: 08/08/2020 Occupation: @GUAROCC @  Subjective:  Discuss next reclast infusion   History of Present Illness: Courtney Guzman is a 60 y.o. female with history of osteoporosis and fibromyalgia. She presents today to discuss scheduling her next reclast infusion.  Her first Reclast IV infusion was on 08/10/2019.  According to the patient she developed flulike symptoms 24 hours after her last infusion which were self resolving.  She continues to take a calcium supplement on a daily basis.  She denies any falls or fractures.  Patient reports that she has been following up with pain management on a regular basis for management of fibromyalgia. She has intermittent trapezius muscle tension and tenderness as well as pain in the intercostal muscles. She continues to take gabapentin and tizanidine as prescribed.  She also uses Lidoderm patches as needed for pain relief.  Overall her fibromyalgia pain has been manageable.    Activities of Daily Living:  Patient reports morning stiffness for 10 minutes.   Patient Reports nocturnal pain.  Difficulty dressing/grooming: Denies Difficulty climbing stairs: Denies Difficulty getting out of chair: Denies Difficulty using hands for taps, buttons, cutlery, and/or writing: Denies  Review of Systems  Constitutional: Positive for fatigue.  HENT: Negative for mouth sores, mouth dryness and nose dryness.   Eyes: Negative for pain, visual disturbance and dryness.  Respiratory: Negative for cough, hemoptysis, shortness of breath and difficulty breathing.   Cardiovascular: Negative for chest pain, palpitations, hypertension and swelling in legs/feet.  Gastrointestinal: Negative for blood in stool, constipation and diarrhea.  Endocrine: Negative for excessive thirst and increased  urination.  Genitourinary: Negative for difficulty urinating and painful urination.  Musculoskeletal: Positive for arthralgias, joint pain, morning stiffness and muscle tenderness. Negative for joint swelling, myalgias, muscle weakness and myalgias.  Skin: Negative for color change, pallor, rash, hair loss, nodules/bumps, skin tightness, ulcers and sensitivity to sunlight.  Allergic/Immunologic: Negative for susceptible to infections.  Neurological: Positive for numbness. Negative for dizziness, headaches and weakness.  Hematological: Negative for bruising/bleeding tendency and swollen glands.  Psychiatric/Behavioral: Negative for depressed mood and sleep disturbance. The patient is not nervous/anxious.     PMFS History:  Patient Active Problem List   Diagnosis Date Noted  . Osteoarthritis 10/30/2018  . Iron deficiency anemia 10/30/2018  . Essential hypertension 08/11/2016  . Primary insomnia 07/01/2016  . Type 2 diabetes mellitus without complication, without long-term current use of insulin ([place]) 06/14/2015  . Hyperlipidemia 05/10/2014  . Osteopenia 03/22/2012  . GERD (gastroesophageal reflux disease)   . Fibromyalgia syndrome 07/26/2008    Past Medical History:  Diagnosis Date  . Allergy    seasonal  . Arthritis   . Diabetes mellitus without complication ([place])   . Fibromyalgia    dr. Marveen Reeks  . GERD (gastroesophageal reflux disease)   . Hyperlipidemia   . Hypertension   . Wheezes    with season changes     Family History  Problem Relation Age of Onset  . Hypertension Mother   . Diabetes Sister   . Cancer Maternal Grandmother        OVARIAN  . Ovarian cancer Maternal Grandmother   . Colon cancer Neg Hx   .  Colon polyps Neg Hx    Past Surgical History:  Procedure Laterality Date  . [place]   with BTL  . COLONOSCOPY    . COLONOSCOPY W/ POLYPECTOMY  2006   BENIGN ADENOMA   . POLYPECTOMY    . TONSILLECTOMY AND ADENOIDECTOMY     Social History    Social History Narrative  . Not on file   Immunization History  Administered Date(s) Administered  . Influenza,inj,Quad PF,6+ Mos 01/18/2014, 02/19/2016  . Influenza-Unspecified 01/27/2017  . PFIZER(Purple Top)SARS-COV-2 Vaccination 07/21/2019, 08/22/2019  . PPD Test 03/17/2011  . Tdap 04/11/2013, 03/08/2016     Objective: Vital Signs: BP 132/85 (BP Location: Left Arm, Patient Position: Sitting, Cuff Size: Normal)   Pulse 64   Resp 15   Ht 5' (1.524 m)   Wt 172 lb (78 kg)   LMP 02/24/2008   BMI 33.59 kg/m    Physical Exam Vitals and nursing note reviewed.  Constitutional:      Appearance: She is well-developed.  HENT:     Head: Normocephalic and atraumatic.  Eyes:     Conjunctiva/sclera: Conjunctivae normal.  Pulmonary:     Effort: Pulmonary effort is normal.  Abdominal:     Palpations: Abdomen is soft.  Musculoskeletal:     Cervical back: Normal range of motion.  Skin:    General: Skin is warm and dry.     Capillary Refill: Capillary refill takes less than 2 seconds.  Neurological:     Mental Status: She is alert and oriented to person, place, and time.  Psychiatric:        Behavior: Behavior normal.      Musculoskeletal Exam: C-spine, thoracic spine, and lumbar spine good ROM. Trapezius muscle tension and tenderness bilaterally. Shoulder joints, elbow joints wrist joints, MCPs, PIPs, and DIPs good ROM with no synovitis.  Complete fist formation bilaterally. Mild PIP and DIP thickening consistent with osteoarthritis of both hands. Hip joints, knee joints, and ankle joints good ROM with no discomfort.  No warmth or effusion of knee joints.  No tenderness or swelling of ankle joints.   CDAI Exam: CDAI Score: -- Patient Global: --; Provider Global: -- Swollen: --; Tender: -- Joint Exam 08/08/2020   No joint exam has been documented for this visit   There is currently no information documented on the homunculus. Go to the Rheumatology activity and complete the  homunculus joint exam.  Investigation: No additional findings.  Imaging: No results found.  Recent Labs: Lab Results  Component Value Date   WBC 6.3 11/02/2019   HGB 13.0 11/02/2019   PLT 336 11/02/2019   NA 142 11/02/2019   K 5.1 11/02/2019   CL 105 11/02/2019   CO2 25 11/02/2019   GLUCOSE 97 11/02/2019   BUN 10 11/02/2019   CREATININE 0.70 11/02/2019   BILITOT 0.5 11/02/2019   ALKPHOS 85 11/02/2019   AST 21 11/02/2019   ALT 14 11/02/2019   PROT 7.4 11/02/2019   ALBUMIN 4.8 11/02/2019   CALCIUM 9.9 11/02/2019   GFRAA 110 11/02/2019    Speciality Comments: No specialty comments available.  Procedures:  No procedures performed Allergies: Patient has no known allergies.   Assessment / Plan:     Visit Diagnoses: Age-related osteoporosis without current pathological fracture - DEXA 06/29/2019: AP spine 0.856 with T-score: -2.7.  Previous DEXA on 07/31/13: AP spine BMD 0.839 with T-score -1.9. Her first IV Reclast infusion was on 08/10/2019.  She experienced flu-like symptoms 24 hours after the infusion  which were self resolving within 1 day. She has been taking a calcium and vitamin D supplement on a daily basis as recommended.  She is due for her next reclast infusion. CBC, CMP, and vitamin D level will be checked today prior to scheduling next reclast infusion.  She has not had any recent falls or fractures.  She will be due to update DEXA in March 2023. She will follow up in the office in 1 year to discuss next DEXA results and further management.   - Plan: CBC with Differential/Platelet, COMPLETE METABOLIC PANEL WITH GFR, VITAMIN D 25 Hydroxy (Vit-D Deficiency, Fractures)  Medication monitoring encounter - CBC, CMP, and vitamin D will be checked today prior to scheduling IV reclast infusion.  Plan: CBC with Differential/Platelet, COMPLETE METABOLIC PANEL WITH GFR, VITAMIN D 25 Hydroxy (Vit-D Deficiency, Fractures)  Vitamin D deficiency - Vitamin D level will be checked today  prior to scheduling reclast infusion.  Plan: VITAMIN D 25 Hydroxy (Vit-D Deficiency, Fractures)  Fibromyalgia syndrome -She continues to experience intermittent fibromyalgia flares.  She has ongoing trapezius muscle tension and tenderness bilaterally.  She experiences muscle tenderness in the intercostal muscles on occasion.  She has been taking gabapentin and zanaflex as prescribed by pain management.  She uses lidoderm patches as needed for pain relief.  She plans on continuing to follow up with pain management.   Primary insomnia: She has been sleeping well at night.   Other fatigue: Stable   Chronic pain syndrome: She has been following up with pain management on a regular basis.   Trapezius muscle spasm: She has ongoing trapezius muscle tension and tenderness bilaterally.  She uses a heating pad on a daily basis.  She continues to take zanaflex 4 mg at bedtime for muscle spasms.   History of bilateral carpal tunnel release: Asymptomatic at this time.   Other medical conditions are listed as follows:   Raised intraocular pressure, unspecified laterality  History of hyperlipidemia  Essential hypertension  History of gastroesophageal reflux (GERD)  Type 2 diabetes mellitus without complication, without long-term current use of insulin ([place])    Orders: Orders Placed This Encounter  Procedures  . CBC with Differential/Platelet  . COMPLETE METABOLIC PANEL WITH GFR  . VITAMIN D 25 Hydroxy (Vit-D Deficiency, Fractures)   No orders of the defined types were placed in this encounter.    Follow-Up Instructions: Return in about 1 year (around 08/08/2021) for Osteoporosis, Fibromyalgia.   Ofilia Neas, PA-C  Note - This record has been created using Dragon software.  Chart creation errors have been sought, but may not always  have been located. Such creation errors do not reflect on  the standard of medical care.

## 2020-08-08 ENCOUNTER — Other Ambulatory Visit: Payer: Self-pay

## 2020-08-08 ENCOUNTER — Encounter: Payer: Self-pay | Admitting: Physician Assistant

## 2020-08-08 ENCOUNTER — Ambulatory Visit: Payer: 59 | Admitting: Physician Assistant

## 2020-08-08 VITALS — BP 132/85 | HR 64 | Resp 15 | Ht 60.0 in | Wt 172.0 lb

## 2020-08-08 DIAGNOSIS — M797 Fibromyalgia: Secondary | ICD-10-CM

## 2020-08-08 DIAGNOSIS — G894 Chronic pain syndrome: Secondary | ICD-10-CM

## 2020-08-08 DIAGNOSIS — I10 Essential (primary) hypertension: Secondary | ICD-10-CM

## 2020-08-08 DIAGNOSIS — Z8639 Personal history of other endocrine, nutritional and metabolic disease: Secondary | ICD-10-CM

## 2020-08-08 DIAGNOSIS — F5101 Primary insomnia: Secondary | ICD-10-CM | POA: Diagnosis not present

## 2020-08-08 DIAGNOSIS — M62838 Other muscle spasm: Secondary | ICD-10-CM

## 2020-08-08 DIAGNOSIS — Z9889 Other specified postprocedural states: Secondary | ICD-10-CM

## 2020-08-08 DIAGNOSIS — Z8719 Personal history of other diseases of the digestive system: Secondary | ICD-10-CM

## 2020-08-08 DIAGNOSIS — Z5181 Encounter for therapeutic drug level monitoring: Secondary | ICD-10-CM

## 2020-08-08 DIAGNOSIS — M810 Age-related osteoporosis without current pathological fracture: Secondary | ICD-10-CM | POA: Diagnosis not present

## 2020-08-08 DIAGNOSIS — E119 Type 2 diabetes mellitus without complications: Secondary | ICD-10-CM

## 2020-08-08 DIAGNOSIS — H40059 Ocular hypertension, unspecified eye: Secondary | ICD-10-CM

## 2020-08-08 DIAGNOSIS — R5383 Other fatigue: Secondary | ICD-10-CM

## 2020-08-08 DIAGNOSIS — E559 Vitamin D deficiency, unspecified: Secondary | ICD-10-CM

## 2020-08-09 ENCOUNTER — Telehealth: Payer: Self-pay | Admitting: Rheumatology

## 2020-08-09 ENCOUNTER — Other Ambulatory Visit: Payer: Self-pay | Admitting: Pharmacist

## 2020-08-09 DIAGNOSIS — M810 Age-related osteoporosis without current pathological fracture: Secondary | ICD-10-CM

## 2020-08-09 LAB — CBC WITH DIFFERENTIAL/PLATELET
Absolute Monocytes: 493 cells/uL (ref 200–950)
Basophils Absolute: 50 cells/uL (ref 0–200)
Basophils Relative: 0.9 %
Eosinophils Absolute: 190 cells/uL (ref 15–500)
Eosinophils Relative: 3.4 %
HCT: 39.9 % (ref 35.0–45.0)
Hemoglobin: 12.7 g/dL (ref 11.7–15.5)
Lymphs Abs: 2570 cells/uL (ref 850–3900)
MCH: 28.3 pg (ref 27.0–33.0)
MCHC: 31.8 g/dL — ABNORMAL LOW (ref 32.0–36.0)
MCV: 89.1 fL (ref 80.0–100.0)
MPV: 10.6 fL (ref 7.5–12.5)
Monocytes Relative: 8.8 %
Neutro Abs: 2296 cells/uL (ref 1500–7800)
Neutrophils Relative %: 41 %
Platelets: 332 10*3/uL (ref 140–400)
RBC: 4.48 10*6/uL (ref 3.80–5.10)
RDW: 13.4 % (ref 11.0–15.0)
Total Lymphocyte: 45.9 %
WBC: 5.6 10*3/uL (ref 3.8–10.8)

## 2020-08-09 LAB — VITAMIN D 25 HYDROXY (VIT D DEFICIENCY, FRACTURES): Vit D, 25-Hydroxy: 32 ng/mL (ref 30–100)

## 2020-08-09 LAB — COMPLETE METABOLIC PANEL WITH GFR
AG Ratio: 1.9 (calc) (ref 1.0–2.5)
ALT: 15 U/L (ref 6–29)
AST: 21 U/L (ref 10–35)
Albumin: 4.5 g/dL (ref 3.6–5.1)
Alkaline phosphatase (APISO): 72 U/L (ref 37–153)
BUN: 9 mg/dL (ref 7–25)
CO2: 28 mmol/L (ref 20–32)
Calcium: 9.8 mg/dL (ref 8.6–10.4)
Chloride: 104 mmol/L (ref 98–110)
Creat: 0.65 mg/dL (ref 0.50–0.99)
GFR, Est African American: 112 mL/min/{1.73_m2} (ref >=60)
GFR, Est Non African American: 96 mL/min/{1.73_m2} (ref >=60)
Globulin: 2.4 g/dL (calc) (ref 1.9–3.7)
Glucose, Bld: 115 mg/dL — ABNORMAL HIGH (ref 65–99)
Potassium: 4.7 mmol/L (ref 3.5–5.3)
Sodium: 139 mmol/L (ref 135–146)
Total Bilirubin: 0.5 mg/dL (ref 0.2–1.2)
Total Protein: 6.9 g/dL (ref 6.1–8.1)

## 2020-08-09 NOTE — Telephone Encounter (Signed)
Patient calling because it is time for her infusion, and she requests for the order to be placed at [facility].

## 2020-08-09 NOTE — Progress Notes (Signed)
Next infusion due for Reclast and due for updated orders. Diagnosis: age-related osteoporosis with no history of fracture. No recent falls or fractures  Tolerated first infusion well with mild flu-like symptoms which resolves after a couple of days. She continues to take Vitamin D and calcium supplementary to dietary intake.  Dose: 5mg  IV every 12 months  Last Clinic Visit: 08/08/20 Next Clinic Visit: 07/31/21  Last infusion:08/10/19 Labs drawn at [facility] on 08/08/20: - Vit D - 32 - Glucose slightly elevated at 115 but rest of CMP and CBC wnl - DEXA on 06/29/19   Orders placed for Reclast x 1 dose along with premedication of Tylenol and Benadryl to be administered 30 minutes before medication infusion.  Called patient and provided with phone number for [facility] Medical Day [phone]). Will f/u to ensure it is scheduled  Knox Saliva, PharmD, MPH Clinical Pharmacist (Rheumatology and Pulmonology)

## 2020-08-09 NOTE — Telephone Encounter (Signed)
Returned patient's call and advised that I've placed Reclast order. Provided phone number for [facility] Medical Day. Advised to drink plenty of water beforehand. Patient voiced understanding. Will f/u to ensure Reclast is scheduled

## 2020-08-09 NOTE — Progress Notes (Signed)
Glucose is 115.  Rest of CMP WNL.  CBC WNL.  Vitamin D is WNL.  Please advise the patient to continue a maintenance dose of vitamin D.

## 2020-08-12 ENCOUNTER — Telehealth: Payer: Self-pay | Admitting: Physical Medicine and Rehabilitation

## 2020-08-12 NOTE — Telephone Encounter (Signed)
Needs a call from the doctor does not want to do the TP would like to move forward with [employer], the cortizone in shots is effecting Blood sugar

## 2020-08-13 ENCOUNTER — Other Ambulatory Visit: Payer: Self-pay | Admitting: Internal Medicine

## 2020-08-13 NOTE — Telephone Encounter (Signed)
Pt has TOC appt in July with new provider...Marland Kitchen please advise

## 2020-08-16 ENCOUNTER — Telehealth: Payer: Self-pay

## 2020-08-16 MED ORDER — TIZANIDINE HCL 4 MG PO TABS: ORAL_TABLET | ORAL | 5 refills | Status: DC

## 2020-08-16 NOTE — Telephone Encounter (Signed)
Refill request Tizanidine

## 2020-08-21 ENCOUNTER — Other Ambulatory Visit: Payer: Self-pay | Admitting: *Deleted

## 2020-08-21 ENCOUNTER — Ambulatory Visit: Payer: 59 | Admitting: Physical Medicine and Rehabilitation

## 2020-08-21 MED ORDER — GABAPENTIN 300 MG PO CAPS: ORAL_CAPSULE | ORAL | 2 refills | Status: DC

## 2020-08-26 ENCOUNTER — Ambulatory Visit (AMBULATORY_SURGERY_CENTER): Payer: Self-pay | Admitting: *Deleted

## 2020-08-26 ENCOUNTER — Other Ambulatory Visit: Payer: Self-pay

## 2020-08-26 VITALS — Ht 60.0 in | Wt 170.0 lb

## 2020-08-26 DIAGNOSIS — Z86010 Personal history of colonic polyps: Secondary | ICD-10-CM

## 2020-08-26 MED ORDER — SUTAB 1479-225-188 MG PO TABS
1.0000 | ORAL_TABLET | ORAL | 0 refills | Status: DC
Start: 2020-08-26 — End: 2020-09-06

## 2020-08-26 NOTE — Progress Notes (Signed)

## 2020-08-27 ENCOUNTER — Ambulatory Visit (HOSPITAL_COMMUNITY)
Admission: RE | Admit: 2020-08-27 | Discharge: 2020-08-27 | Disposition: A | Discharge status: Home / Self Care | Payer: 59 | Source: Ambulatory Visit | Attending: Rheumatology | Admitting: Rheumatology

## 2020-08-27 ENCOUNTER — Ambulatory Visit (HOSPITAL_COMMUNITY): Payer: 59

## 2020-08-27 DIAGNOSIS — M810 Age-related osteoporosis without current pathological fracture: Secondary | ICD-10-CM | POA: Insufficient documentation

## 2020-08-27 MED ORDER — ACETAMINOPHEN 325 MG PO TABS: 650.0000 mg | ORAL_TABLET | Freq: Once | ORAL | Status: DC

## 2020-08-27 MED ORDER — ZOLEDRONIC ACID 5 MG/100ML IV SOLN
INTRAVENOUS | Status: AC
  Administered 2020-08-27: 5 mg
  Filled 2020-08-27: qty 100

## 2020-08-27 MED ORDER — DIPHENHYDRAMINE HCL 25 MG PO CAPS: 25.0000 mg | ORAL_CAPSULE | Freq: Once | ORAL | Status: DC

## 2020-08-27 MED ORDER — ZOLEDRONIC ACID 5 MG/100ML IV SOLN: 5.0000 mg | Freq: Once | INTRAVENOUS | Status: DC

## 2020-08-28 ENCOUNTER — Ambulatory Visit (HOSPITAL_COMMUNITY): Payer: 59

## 2020-09-03 ENCOUNTER — Encounter: Payer: Self-pay | Admitting: Gastroenterology

## 2020-09-06 ENCOUNTER — Other Ambulatory Visit: Payer: Self-pay

## 2020-09-06 ENCOUNTER — Encounter: Payer: Self-pay | Admitting: Gastroenterology

## 2020-09-06 ENCOUNTER — Ambulatory Visit (AMBULATORY_SURGERY_CENTER): Payer: 59 | Admitting: Gastroenterology

## 2020-09-06 VITALS — BP 92/55 | HR 55 | Temp 97.5°F | Resp 21 | Ht 60.0 in | Wt 170.0 lb

## 2020-09-06 DIAGNOSIS — D123 Benign neoplasm of transverse colon: Secondary | ICD-10-CM

## 2020-09-06 DIAGNOSIS — D120 Benign neoplasm of cecum: Secondary | ICD-10-CM

## 2020-09-06 DIAGNOSIS — D128 Benign neoplasm of rectum: Secondary | ICD-10-CM

## 2020-09-06 DIAGNOSIS — Z86010 Personal history of colonic polyps: Secondary | ICD-10-CM

## 2020-09-06 MED ORDER — SODIUM CHLORIDE 0.9 % IV SOLN: 500.0000 mL | Freq: Once | INTRAVENOUS | Status: DC

## 2020-09-06 NOTE — Op Note (Signed)
[place] Patient Name: Courtney Guzman Procedure Date: 09/06/2020 7:21 AM MRN: [id number] Endoscopist: Remo Lipps P. Havery Moros , MD Age: 60 Referring MD:  Date of Birth: 03/30/1961 Gender: Female Account #: [id number] Procedure:                Colonoscopy Indications:              High risk colon cancer surveillance: Personal                            history of colonic polyps - adenomas removed 2017 Medicines:                Monitored Anesthesia Care Procedure:                Pre-Anesthesia Assessment:                           - Prior to the procedure, a History and Physical                            was performed, and patient medications and                            allergies were reviewed. The patient's tolerance of                            previous anesthesia was also reviewed. The risks                            and benefits of the procedure and the sedation                            options and risks were discussed with the patient.                            All questions were answered, and informed consent                            was obtained. Prior Anticoagulants: The patient has                            taken no previous anticoagulant or antiplatelet                            agents. ASA Grade Assessment: II - A patient with                            mild systemic disease. After reviewing the risks                            and benefits, the patient was deemed in                            satisfactory condition to undergo the procedure.  After obtaining informed consent, the colonoscope                            was passed under direct vision. Throughout the                            procedure, the patient's blood pressure, pulse, and                            oxygen saturations were monitored continuously. The                            Olympus PFC-H190DL [id number]) Colonoscope was                            introduced  through the anus and advanced to the the                            cecum, identified by appendiceal orifice and                            ileocecal valve. The colonoscopy was performed                            without difficulty. The patient tolerated the                            procedure well. The quality of the bowel                            preparation was good. The ileocecal valve,                            appendiceal orifice, and rectum were photographed. Scope In: 7:59:57 AM Scope Out: 8:16:03 AM Scope Withdrawal Time: 0 hours 13 minutes 13 seconds  Total Procedure Duration: 0 hours 16 minutes 6 seconds  Findings:                 The perianal and digital rectal examinations were                            normal.                           Two flat and sessile polyps were found in the                            cecum. The polyps were 3 mm in size. These polyps                            were removed with a cold snare. Resection and                            retrieval were complete.  Two flat polyps were found in the transverse colon.                            The polyps were diminutive in size. These polyps                            were removed with a cold snare. Resection and                            retrieval were complete.                           A 4 to 5 mm polyp was found in the rectum. The                            polyp was sessile. The polyp was removed with a                            cold snare. Resection and retrieval were complete.                           Internal hemorrhoids were found during                            retroflexion. The hemorrhoids were small.                           The exam was otherwise without abnormality. Complications:            No immediate complications. Estimated blood loss:                            Minimal. Estimated Blood Loss:     Estimated blood loss was minimal. Impression:                - Two 3 mm polyps in the cecum, removed with a cold                            snare. Resected and retrieved.                           - Two diminutive polyps in the transverse colon,                            removed with a cold snare. Resected and retrieved.                           - One 4 to 5 mm polyp in the rectum, removed with a                            cold snare. Resected and retrieved.                           - Internal hemorrhoids.                           -  The examination was otherwise normal. Recommendation:           - Patient has a contact number available for                            emergencies. The signs and symptoms of potential                            delayed complications were discussed with the                            patient. Return to normal activities tomorrow.                            Written discharge instructions were provided to the                            patient.                           - Resume previous diet.                           - Continue present medications.                           - Await pathology results. Remo Lipps P. Havery Moros, MD 09/06/2020 8:22:55 AM This report has been signed electronically.

## 2020-09-06 NOTE — Patient Instructions (Signed)
Handouts given:  Polyps, hemorrhoids Resume previous diet Continue current medications Await pathology results  YOU HAD AN ENDOSCOPIC PROCEDURE TODAY AT THE [facility]:   Refer to the procedure report that was given to you for any specific questions about what was found during the examination.  If the procedure report does not answer your questions, please call your gastroenterologist to clarify.  If you requested that your care partner not be given the details of your procedure findings, then the procedure report has been included in a sealed envelope for you to review at your convenience later.  YOU SHOULD EXPECT: Some feelings of bloating in the abdomen. Passage of more gas than usual.  Walking can help get rid of the air that was put into your GI tract during the procedure and reduce the bloating. If you had a lower endoscopy (such as a colonoscopy or flexible sigmoidoscopy) you may notice spotting of blood in your stool or on the toilet paper. If you underwent a bowel prep for your procedure, you may not have a normal bowel movement for a few days.  Please Note:  You might notice some irritation and congestion in your nose or some drainage.  This is from the oxygen used during your procedure.  There is no need for concern and it should clear up in a day or so.  SYMPTOMS TO REPORT IMMEDIATELY:   Following lower endoscopy (colonoscopy or flexible sigmoidoscopy):  Excessive amounts of blood in the stool  Significant tenderness or worsening of abdominal pains  Swelling of the abdomen that is new, acute  Fever of 100F or higher  For urgent or emergent issues, a gastroenterologist can be reached at any hour by calling [phone]. Do not use MyChart messaging for urgent concerns.   DIET:  We do recommend a small meal at first, but then you may proceed to your regular diet.  Drink plenty of fluids but you should avoid alcoholic beverages for 24 hours.  ACTIVITY:  You should  plan to take it easy for the rest of today and you should NOT DRIVE or use heavy machinery until tomorrow (because of the sedation medicines used during the test).    FOLLOW UP: Our staff will call the number listed on your records 48-72 hours following your procedure to check on you and address any questions or concerns that you may have regarding the information given to you following your procedure. If we do not reach you, we will leave a message.  We will attempt to reach you two times.  During this call, we will ask if you have developed any symptoms of COVID 19. If you develop any symptoms (ie: fever, flu-like symptoms, shortness of breath, cough etc.) before then, please call [phone].  If you test positive for Covid 19 in the 2 weeks post procedure, please call and report this information to us.    If any biopsies were taken you will be contacted by phone or by letter within the next 1-3 weeks.  Please call us at [phone] if you have not heard about the biopsies in 3 weeks.   SIGNATURES/CONFIDENTIALITY: You and/or your care partner have signed paperwork which will be entered into your electronic medical record.  These signatures attest to the fact that that the information above on your After Visit Summary has been reviewed and is understood.  Full responsibility of the confidentiality of this discharge information lies with you and/or your care-partner. 

## 2020-09-06 NOTE — Progress Notes (Signed)
Pt's states no medical or surgical changes since previsit or office visit. 

## 2020-09-06 NOTE — Progress Notes (Signed)
Called to room to assist during endoscopic procedure.  Patient ID and intended procedure confirmed with present staff. Received instructions for my participation in the procedure from the performing physician.  

## 2020-09-06 NOTE — Progress Notes (Signed)
PT taken to PACU. Monitors in place. VSS. Report given to RN. 

## 2020-09-08 ENCOUNTER — Other Ambulatory Visit: Payer: Self-pay | Admitting: Internal Medicine

## 2020-09-10 ENCOUNTER — Telehealth: Payer: Self-pay

## 2020-09-10 ENCOUNTER — Telehealth: Payer: Self-pay | Admitting: *Deleted

## 2020-09-10 NOTE — Telephone Encounter (Signed)
  Follow up Call-  Call back number 09/06/2020  Post procedure Call Back phone  # [phone]  Permission to leave phone message Yes  Some recent data might be hidden     Patient questions:  Do you have a fever, pain , or abdominal swelling? No. Pain Score  0 *  Have you tolerated food without any problems? Yes.    Have you been able to return to your normal activities? Yes.    Do you have any questions about your discharge instructions: Diet   No. Medications  No. Follow up visit  No.  Do you have questions or concerns about your Care? No.  Actions: * If pain score is 4 or above: No action needed, pain <4.  1. Have you developed a fever since your procedure? no  2.   Have you had an respiratory symptoms (SOB or cough) since your procedure? no  3.   Have you tested positive for COVID 19 since your procedure no  4.   Have you had any family members/close contacts diagnosed with the COVID 19 since your procedure?  no   If yes to any of these questions please route to Joylene John, RN and Joella Prince, RN

## 2020-09-10 NOTE — Telephone Encounter (Signed)
Follow up call made. 

## 2020-09-11 ENCOUNTER — Other Ambulatory Visit: Payer: Self-pay | Admitting: Internal Medicine

## 2020-09-11 MED ORDER — LISINOPRIL 20 MG PO TABS: ORAL_TABLET | ORAL | 1 refills | Status: DC

## 2020-09-11 MED ORDER — ATORVASTATIN CALCIUM 10 MG PO TABS: 10.0000 mg | ORAL_TABLET | Freq: Every day | ORAL | 1 refills | Status: DC

## 2020-09-11 MED ORDER — AMLODIPINE BESYLATE 10 MG PO TABS: 1.0000 | ORAL_TABLET | Freq: Every day | ORAL | 1 refills | Status: DC

## 2020-09-11 NOTE — Telephone Encounter (Signed)
Requested medication (s) are due for refill today - yes  Requested medication (s) are on the active medication list -yes  Future visit scheduled -yes- not at PCP office  Last refill: 1 month ago  Notes to clinic: Patient is not following provider to new office- is changing to different office- do you want to cover her until appointment?   Requested Prescriptions  Pending Prescriptions Disp Refills   lisinopril (ZESTRIL) 20 MG tablet 30 tablet 1      Cardiovascular:  ACE Inhibitors Failed - 09/11/2020  1:47 PM      Failed - Valid encounter within last 6 months    Recent Outpatient Visits   None     Future Appointments             In 1 month Valere Dross, Marvis Repress, [place] at [facility], [place]   In 10 months Bo Merino, MD [place] - Cr in normal range and within 180 days    Creat  Date Value Ref Range Status  08/08/2020 0.65 0.50 - 0.99 mg/dL Final    Comment:    For patients >54 years of age, the reference limit for Creatinine is approximately 13% higher for people identified as African-American. .    Creatinine,U  Date Value Ref Range Status  03/03/2016 9.4 mg/dL Final          Passed - K in normal range and within 180 days    Potassium  Date Value Ref Range Status  08/08/2020 4.7 3.5 - 5.3 mmol/L Final          Passed - Patient is not pregnant      Passed - Last BP in normal range    BP Readings from Last 1 Encounters:  09/06/20 (!) 92/55            atorvastatin (LIPITOR) 10 MG tablet 30 tablet 1    Sig: Take 1 tablet (10 mg total) by mouth daily at 6 PM.      Cardiovascular:  Antilipid - Statins Failed - 09/11/2020  1:47 PM      Failed - Valid encounter within last 12 months    Recent Outpatient Visits   None     Future Appointments             In 1 month Valere Dross, Marvis Repress, [place] at [facility], [place]   In 10 months  Bo Merino, MD [place] - Total Cholesterol in normal range and within 360 days    Cholesterol, Total  Date Value Ref Range Status  11/02/2019 187 100 - 199 mg/dL Final          Passed - LDL in normal range and within 360 days    LDL Chol Calc (NIH)  Date Value Ref Range Status  11/02/2019 99 0 - 99 mg/dL Final          Passed - HDL in normal range and within 360 days    HDL  Date Value Ref Range Status  11/02/2019 75 >39 mg/dL Final          Passed - Triglycerides in normal range and within 360 days    Triglycerides  Date Value Ref Range Status  11/02/2019 69 0 - 149 mg/dL Final  Passed - Patient is not pregnant        amLODipine (NORVASC) 10 MG tablet 30 tablet 1    Sig: Take 1 tablet (10 mg total) by mouth daily.      Cardiovascular:  Calcium Channel Blockers Failed - 09/11/2020  1:47 PM      Failed - Valid encounter within last 6 months    Recent Outpatient Visits   None     Future Appointments             In 1 month Valere Dross, Marvis Repress, [place] at [facility], [place]   In 10 months Bo Merino, MD [place] - Last BP in normal range    BP Readings from Last 1 Encounters:  09/06/20 (!) 92/55              Requested Prescriptions  Pending Prescriptions Disp Refills   lisinopril (ZESTRIL) 20 MG tablet 30 tablet 1      Cardiovascular:  ACE Inhibitors Failed - 09/11/2020  1:47 PM      Failed - Valid encounter within last 6 months    Recent Outpatient Visits   None     Future Appointments             In 1 month Valere Dross, Marvis Repress, [place] at [facility], [place]   In 10 months Bo Merino, MD [place] - Cr in normal range and within 180 days    Creat  Date Value Ref Range Status  08/08/2020 0.65 0.50 - 0.99 mg/dL Final     Comment:    For patients >40 years of age, the reference limit for Creatinine is approximately 13% higher for people identified as African-American. .    Creatinine,U  Date Value Ref Range Status  03/03/2016 9.4 mg/dL Final          Passed - K in normal range and within 180 days    Potassium  Date Value Ref Range Status  08/08/2020 4.7 3.5 - 5.3 mmol/L Final          Passed - Patient is not pregnant      Passed - Last BP in normal range    BP Readings from Last 1 Encounters:  09/06/20 (!) 92/55            atorvastatin (LIPITOR) 10 MG tablet 30 tablet 1    Sig: Take 1 tablet (10 mg total) by mouth daily at 6 PM.      Cardiovascular:  Antilipid - Statins Failed - 09/11/2020  1:47 PM      Failed - Valid encounter within last 12 months    Recent Outpatient Visits   None     Future Appointments             In 1 month Valere Dross, Marvis Repress, [place] at [facility], [place]   In 10 months Bo Merino, MD [facility]             Passed - Total Cholesterol in normal range and within 360 days    Cholesterol, Total  Date Value Ref Range Status  11/02/2019 187 100 - 199 mg/dL Final          Passed - LDL in normal range and within 360  days    LDL Chol Calc (NIH)  Date Value Ref Range Status  11/02/2019 99 0 - 99 mg/dL Final          Passed - HDL in normal range and within 360 days    HDL  Date Value Ref Range Status  11/02/2019 75 >39 mg/dL Final          Passed - Triglycerides in normal range and within 360 days    Triglycerides  Date Value Ref Range Status  11/02/2019 69 0 - 149 mg/dL Final          Passed - Patient is not pregnant        amLODipine (NORVASC) 10 MG tablet 30 tablet 1    Sig: Take 1 tablet (10 mg total) by mouth daily.      Cardiovascular:  Calcium Channel Blockers Failed - 09/11/2020  1:47 PM      Failed - Valid encounter within last 6 months    Recent Outpatient Visits    None     Future Appointments             In 1 month Valere Dross, Marvis Repress, [place] at [facility], [place]   In 10 months Bo Merino, MD [place] - Last BP in normal range    BP Readings from Last 1 Encounters:  09/06/20 (!) 92/55

## 2020-09-11 NOTE — Telephone Encounter (Signed)
Copied from [place] [id number]. Topic: Quick Communication - Rx Refill/Question >> Sep 11, 2020 12:57 PM Leward Quan A wrote: Medication: amLODipine (NORVASC) 10 MG tablet, atorvastatin (LIPITOR) 10 MG tablet, lisinopril (ZESTRIL) 20 MG tablet  Patient has an appointment but not until July need refill till then please Has the patient contacted their pharmacy? Yes.   (Agent: If no, request that the patient contact the pharmacy for the refill.) (Agent: If yes, when and what did the pharmacy advise?)  Preferred Pharmacy (with phone number or street name): [id number] - [place], [place]  Phone:  [phone] Fax:  [phone]     Agent: Please be advised that RX refills may take up to 3 business days. We ask that you follow-up with your pharmacy.

## 2020-09-25 ENCOUNTER — Telehealth: Payer: Self-pay | Admitting: Gastroenterology

## 2020-09-25 NOTE — Telephone Encounter (Signed)
Spoke with patient in regards to her pathology results. Answered all of patient's questions and concerns in regards to her pathology results. Patient thanked me for the call, she verbalized understanding and had no concerns at the end of the call.

## 2020-09-25 NOTE — Telephone Encounter (Signed)
Inbound call from patient requesting a call from a nurse please stating she has additional questions in regards her colonoscopy results.

## 2020-11-05 ENCOUNTER — Encounter: Payer: Self-pay | Admitting: Family

## 2020-11-05 ENCOUNTER — Ambulatory Visit: Payer: 59 | Admitting: Family

## 2020-11-05 ENCOUNTER — Other Ambulatory Visit: Payer: Self-pay

## 2020-11-05 VITALS — BP 132/84 | HR 67 | Temp 98.2°F | Ht 60.0 in | Wt 170.8 lb

## 2020-11-05 DIAGNOSIS — R7303 Prediabetes: Secondary | ICD-10-CM | POA: Diagnosis not present

## 2020-11-05 DIAGNOSIS — E7800 Pure hypercholesterolemia, unspecified: Secondary | ICD-10-CM

## 2020-11-05 DIAGNOSIS — Z0000 Encounter for general adult medical examination without abnormal findings: Secondary | ICD-10-CM

## 2020-11-05 DIAGNOSIS — J4520 Mild intermittent asthma, uncomplicated: Secondary | ICD-10-CM | POA: Diagnosis not present

## 2020-11-05 LAB — CBC WITH DIFFERENTIAL/PLATELET
Basophils Absolute: 0.1 10*3/uL (ref 0.0–0.1)
Basophils Relative: 1 % (ref 0.0–3.0)
Eosinophils Absolute: 0.2 10*3/uL (ref 0.0–0.7)
Eosinophils Relative: 2.5 % (ref 0.0–5.0)
HCT: 39.1 % (ref 36.0–46.0)
Hemoglobin: 12.8 g/dL (ref 12.0–15.0)
Lymphocytes Relative: 48.1 % — ABNORMAL HIGH (ref 12.0–46.0)
Lymphs Abs: 3.2 10*3/uL (ref 0.7–4.0)
MCHC: 32.9 g/dL (ref 30.0–36.0)
MCV: 87.6 fl (ref 78.0–100.0)
Monocytes Absolute: 0.5 10*3/uL (ref 0.1–1.0)
Monocytes Relative: 8 % (ref 3.0–12.0)
Neutro Abs: 2.7 10*3/uL (ref 1.4–7.7)
Neutrophils Relative %: 40.4 % — ABNORMAL LOW (ref 43.0–77.0)
Platelets: 311 10*3/uL (ref 150.0–400.0)
RBC: 4.46 Mil/uL (ref 3.87–5.11)
RDW: 14.1 % (ref 11.5–15.5)
WBC: 6.7 10*3/uL (ref 4.0–10.5)

## 2020-11-05 LAB — LIPID PANEL
Cholesterol: 180 mg/dL (ref 0–200)
HDL: 75.3 mg/dL (ref >39.00)
LDL Cholesterol: 90 mg/dL (ref 0–99)
NonHDL: 104.51
Total CHOL/HDL Ratio: 2
Triglycerides: 75 mg/dL (ref 0.0–149.0)
VLDL: 15 mg/dL (ref 0.0–40.0)

## 2020-11-05 LAB — COMPREHENSIVE METABOLIC PANEL
ALT: 25 U/L (ref 0–35)
AST: 29 U/L (ref 0–37)
Albumin: 4.7 g/dL (ref 3.5–5.2)
Alkaline Phosphatase: 68 U/L (ref 39–117)
BUN: 11 mg/dL (ref 6–23)
CO2: 27 mEq/L (ref 19–32)
Calcium: 9.7 mg/dL (ref 8.4–10.5)
Chloride: 106 mEq/L (ref 96–112)
Creatinine, Ser: 0.6 mg/dL (ref 0.40–1.20)
GFR: 97.31 mL/min (ref >60.00)
Glucose, Bld: 99 mg/dL (ref 70–99)
Potassium: 4.6 mEq/L (ref 3.5–5.1)
Sodium: 139 mEq/L (ref 135–145)
Total Bilirubin: 0.6 mg/dL (ref 0.2–1.2)
Total Protein: 7.3 g/dL (ref 6.0–8.3)

## 2020-11-05 LAB — HEMOGLOBIN A1C: Hgb A1c MFr Bld: 6.3 % (ref 4.6–6.5)

## 2020-11-05 LAB — TSH: TSH: 1.19 u[IU]/mL (ref 0.35–5.50)

## 2020-11-05 MED ORDER — AMLODIPINE BESYLATE 10 MG PO TABS: 10.0000 mg | ORAL_TABLET | Freq: Every day | ORAL | 3 refills | Status: DC

## 2020-11-05 MED ORDER — ATORVASTATIN CALCIUM 10 MG PO TABS: 10.0000 mg | ORAL_TABLET | Freq: Every day | ORAL | 3 refills | Status: DC

## 2020-11-05 MED ORDER — FLUOCINOLONE ACETONIDE 0.01 % OT OIL: TOPICAL_OIL | OTIC | 0 refills | Status: DC

## 2020-11-05 MED ORDER — LISINOPRIL 20 MG PO TABS: ORAL_TABLET | ORAL | 3 refills | Status: DC

## 2020-11-05 MED ORDER — ALBUTEROL SULFATE HFA 108 (90 BASE) MCG/ACT IN AERS: INHALATION_SPRAY | RESPIRATORY_TRACT | 3 refills | Status: DC

## 2020-11-05 MED ORDER — OMEPRAZOLE 40 MG PO CPDR: 40.0000 mg | DELAYED_RELEASE_CAPSULE | Freq: Every day | ORAL | 3 refills | Status: DC

## 2020-11-05 NOTE — Progress Notes (Signed)
Courtney Guzman is a 60 y.o. female with the following history as recorded in EpicCare:  Patient Active Problem List   Diagnosis Date Noted   Osteoarthritis 10/30/2018   Iron deficiency anemia 10/30/2018   Essential hypertension 08/11/2016   Primary insomnia 07/01/2016   Type 2 diabetes mellitus without complication, without long-term current use of insulin ([place]) 06/14/2015   Hyperlipidemia 05/10/2014   Osteopenia 03/22/2012   GERD (gastroesophageal reflux disease)    Fibromyalgia syndrome 07/26/2008    Current Outpatient Medications  Medication Sig Dispense Refill   calcium carbonate (OS-CAL) 600 MG TABS Take 600 mg by mouth 2 (two) times daily with a meal.     COMBIGAN 0.2-0.5 % ophthalmic solution Apply 1 drop to eye 2 (two) times daily.     ferrous sulfate 325 (65 FE) MG tablet Take 325 mg by mouth daily with breakfast.     fexofenadine (ALLEGRA) 30 MG tablet Take 30 mg by mouth 2 (two) times daily.     fish oil-omega-3 fatty acids 1000 MG capsule Take 2 g by mouth daily.     Fluocinolone Acetonide 0.01 % OIL Apply bid to ear canals as needed 20 mL 0   gabapentin (NEURONTIN) 300 MG capsule TAKE 1 CAPSULE BY MOUTH THREE TIMES A DAY 90 capsule 2   Green Tea, Camillia sinensis, (GREEN TEA PO) Take by mouth.     latanoprost (XALATAN) 0.005 % ophthalmic solution Place into both eyes at bedtime.      lidocaine (LIDODERM) 5 % Place 1 patch onto the skin daily. Remove & Discard patch within 12 hours or as directed by MD 30 patch 2   Multiple Vitamin (MULTIVITAMIN) tablet Take 1 tablet by mouth daily.     multivitamin-lutein (OCUVITE-LUTEIN) CAPS capsule Take 1 capsule by mouth daily.     tiZANidine (ZANAFLEX) 4 MG tablet TAKE 1 TABLET BY MOUTH AT BEDTIME AS NEEDED FOR MUSCLE SPASMS 30 tablet 5   Turmeric (QC TUMERIC COMPLEX PO) Take 1,000 mg by mouth daily.     zoledronic acid (RECLAST) 5 MG/100ML SOLN injection Inject 5 mg into the vein once.     albuterol (VENTOLIN HFA) 108 (90 Base)  MCG/ACT inhaler INHALE 2 PUFFS BY MOUTH EVERY 4 HOURS AS NEEDED FOR WHEEZE 3 g 3   amLODipine (NORVASC) 10 MG tablet Take 1 tablet (10 mg total) by mouth daily. 90 tablet 3   atorvastatin (LIPITOR) 10 MG tablet Take 1 tablet (10 mg total) by mouth daily at 6 PM. 90 tablet 3   lisinopril (ZESTRIL) 20 MG tablet TAKE 1 TABLET BY MOUTH EVERYDAY AT BEDTIME 90 tablet 3   omeprazole (PRILOSEC) 40 MG capsule Take 1 capsule (40 mg total) by mouth daily. 90 capsule 3   No current facility-administered medications for this visit.    Allergies: Patient has no known allergies.  Past Medical History:  Diagnosis Date   Allergy    seasonal   Arthritis    Diabetes mellitus without complication ([place])    Fibromyalgia    dr. Marveen Reeks   GERD (gastroesophageal reflux disease)    Hyperlipidemia    Hypertension    Wheezes    with season changes     Past Surgical History:  Procedure Laterality Date   CESAREAN SECTION  1993   with BTL   COLONOSCOPY  2017   Dr.Armbruster   COLONOSCOPY W/ POLYPECTOMY  2006   BENIGN ADENOMA    POLYPECTOMY     TONSILLECTOMY AND ADENOIDECTOMY  Family History  Problem Relation Age of Onset   Hypertension Mother    Diabetes Sister    Cancer Maternal Grandmother        OVARIAN   Ovarian cancer Maternal Grandmother    Colon cancer Neg Hx    Colon polyps Neg Hx    Esophageal cancer Neg Hx    Rectal cancer Neg Hx    Stomach cancer Neg Hx     Social History   Tobacco Use   Smoking status: Never   Smokeless tobacco: Never  Substance Use Topics   Alcohol use: No    Alcohol/week: 0.0 standard drinks    Subjective:  Presents today as a TOC; previous provider has left the clinic; would like to get CPE;  Sees rheumatology and pain management regularly;  Sees ophthalmology regularly for management of glaucoma;  Requesting refills on her blood pressure and cholesterol medications;   Review of Systems  Constitutional: Negative.   HENT: Negative.    Eyes:  Negative.   Respiratory: Negative.    Cardiovascular: Negative.   Gastrointestinal: Negative.   Genitourinary: Negative.   Musculoskeletal: Negative.   Skin: Negative.   Neurological: Negative.   Endo/Heme/Allergies: Negative.   Psychiatric/Behavioral: Negative.       Objective:  Vitals:   11/05/20 1002  BP: 132/84  Pulse: 67  Temp: 98.2 F (36.8 C)  TempSrc: Oral  SpO2: 99%  Weight: 170 lb 12.8 oz (77.5 kg)  Height: 5' (1.524 m)    General: Well developed, well nourished, in no acute distress  Skin : Warm and dry.  Head: Normocephalic and atraumatic  Eyes: Sclera and conjunctiva clear; pupils round and reactive to light; extraocular movements intact  Ears: External normal; canals clear; tympanic membranes normal  Oropharynx: Pink, supple. No suspicious lesions  Neck: Supple without thyromegaly, adenopathy  Lungs: Respirations unlabored; clear to auscultation bilaterally without wheeze, rales, rhonchi  CVS exam: normal rate and regular rhythm.  Abdomen: Soft; nontender; nondistended; normoactive bowel sounds; no masses or hepatosplenomegaly  Musculoskeletal: No deformities; no active joint inflammation  Extremities: No edema, cyanosis, clubbing  Vessels: Symmetric bilaterally  Neurologic: Alert and oriented; speech intact; face symmetrical; moves all extremities well; CNII-XII intact without focal deficit   Assessment:  1. PE (physical exam), annual   2. Pure hypercholesterolemia   3. Pre-diabetes   4. Reactive airway disease with wheezing, mild intermittent, uncomplicated     Plan:  Age appropriate preventive healthcare needs addressed; encouraged regular eye doctor and dental exams; encouraged regular exercise and weight loss goals; discussed meeting with nutritionist or [facility] and she defers at this time; will update labs and refills as needed today; follow-up to be determined; Continue with rheumatology, ophthalmology as regularly scheduled.    This visit occurred during the SARS-CoV-2 public health emergency.  Safety protocols were in place, including screening questions prior to the visit, additional usage of staff PPE, and extensive cleaning of exam room while observing appropriate contact time as indicated for disinfecting solutions.     Return in about 6 months (around 05/08/2021).  Orders Placed This Encounter  Procedures   CBC with Differential/Platelet   Comp Met (CMET)   Lipid panel   TSH   Hemoglobin A1c    Requested Prescriptions   Signed Prescriptions Disp Refills   albuterol (VENTOLIN HFA) 108 (90 Base) MCG/ACT inhaler 3 g 3    Sig: INHALE 2 PUFFS BY MOUTH EVERY 4 HOURS AS NEEDED FOR WHEEZE   amLODipine (NORVASC) 10  MG tablet 90 tablet 3    Sig: Take 1 tablet (10 mg total) by mouth daily.   atorvastatin (LIPITOR) 10 MG tablet 90 tablet 3    Sig: Take 1 tablet (10 mg total) by mouth daily at 6 PM.   lisinopril (ZESTRIL) 20 MG tablet 90 tablet 3    Sig: TAKE 1 TABLET BY MOUTH EVERYDAY AT BEDTIME   omeprazole (PRILOSEC) 40 MG capsule 90 capsule 3    Sig: Take 1 capsule (40 mg total) by mouth daily.   Fluocinolone Acetonide 0.01 % OIL 20 mL 0    Sig: Apply bid to ear canals as needed

## 2020-11-21 ENCOUNTER — Telehealth: Payer: Self-pay

## 2020-11-21 NOTE — Telephone Encounter (Signed)
I have called pt back and relayed the message from the provider.   If she wants talk about hormones then she will need to make an OV. Pt stated understanding and no appointment wanted at this time.

## 2020-11-21 NOTE — Telephone Encounter (Signed)
Pt called in would like to know if she could try Provitalize for probiotic. She just want to know if it would effect her high bp.

## 2020-12-24 ENCOUNTER — Other Ambulatory Visit: Payer: Self-pay | Admitting: *Deleted

## 2020-12-24 MED ORDER — GABAPENTIN 300 MG PO CAPS: ORAL_CAPSULE | ORAL | 0 refills | Status: DC

## 2020-12-25 ENCOUNTER — Encounter: Payer: Self-pay | Admitting: Physical Medicine and Rehabilitation

## 2020-12-25 ENCOUNTER — Telehealth: Payer: Self-pay | Admitting: *Deleted

## 2020-12-25 ENCOUNTER — Encounter: Payer: 59 | Attending: Physical Medicine and Rehabilitation | Admitting: Physical Medicine and Rehabilitation

## 2020-12-25 ENCOUNTER — Other Ambulatory Visit: Payer: Self-pay

## 2020-12-25 VITALS — BP 120/79 | HR 76 | Temp 97.8°F | Ht 60.0 in | Wt 171.0 lb

## 2020-12-25 DIAGNOSIS — M25511 Pain in right shoulder: Secondary | ICD-10-CM | POA: Diagnosis present

## 2020-12-25 DIAGNOSIS — F5101 Primary insomnia: Secondary | ICD-10-CM | POA: Insufficient documentation

## 2020-12-25 DIAGNOSIS — E663 Overweight: Secondary | ICD-10-CM | POA: Diagnosis present

## 2020-12-25 DIAGNOSIS — M797 Fibromyalgia: Secondary | ICD-10-CM | POA: Diagnosis present

## 2020-12-25 DIAGNOSIS — M7918 Myalgia, other site: Secondary | ICD-10-CM | POA: Insufficient documentation

## 2020-12-25 DIAGNOSIS — G8929 Other chronic pain: Secondary | ICD-10-CM | POA: Diagnosis present

## 2020-12-25 DIAGNOSIS — I10 Essential (primary) hypertension: Secondary | ICD-10-CM | POA: Insufficient documentation

## 2020-12-25 DIAGNOSIS — M25512 Pain in left shoulder: Secondary | ICD-10-CM | POA: Diagnosis present

## 2020-12-25 MED ORDER — VITAMIN D (ERGOCALCIFEROL) 1.25 MG (50000 UNIT) PO CAPS: 50000.0000 [IU] | ORAL_CAPSULE | ORAL | 0 refills | Status: DC

## 2020-12-25 NOTE — Progress Notes (Addendum)
Subjective:    Patient ID: Rickia Vernon, female    DOB: [birth year], 60 y.o.   MRN: [id number]  HPI Mrs. Adderley is a 60 year old woman who presents for follow-up of her fibromyalgia, bilateral shoulder pain, and cervical myofascial pain syndrome, and left thigh heat sensation.    1) cervical myofascial pain syndrome 2) fibromyalgia 3) bilateral shoulder pain -She has been using her heating pad, portable sauna, salonpas.  -She had benefit from trigger point injections -She is still taking Gabapentin '300mg'$  TID.  -Her pain is worse with rainy weather and this limits her mobility. -She tries to walk 3 or 4 days per week, weather permitting. She walks 45 min to an hour. She has a nice safe trail near her house. Also likes to jog a little. [facility] [employer] [facility] per [facility].  -she asks about Biowave. She wants to know if I have had other patients with a positive experience.    4) left anterior thigh heat sensation -today not warm -intermittent -lasts a few seconds -starting in march and April -every couple of day -not painful.   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   5) HTN:  -she would like to wean her blood pressure medications.  -she is planning to discuss this with her PCP.  120/79 today -sometimes top number is higher in the morning  6) prediabetes: -retinopathy runs in her family -she has been following with her ophtalmologist.   Pain Inventory Average Pain 8 Pain Right Now 5 My pain is intermittent, burning, stabbing, tingling and aching  In the last 24 hours, has pain interfered with the following? General activity 0 Relation with others 0 Enjoyment of life 0 What TIME of day is your pain at its worst? evening Sleep (in general) Fair  Pain is worse with: sitting and inactivity Pain improves with: rest and heat/ice Relief from Meds: 4  Family History  Problem Relation Age of Onset   Hypertension Mother     Diabetes Sister    Cancer Maternal Grandmother        OVARIAN   Ovarian cancer Maternal Grandmother    Colon cancer Neg Hx    Colon polyps Neg Hx    Esophageal cancer Neg Hx    Rectal cancer Neg Hx    Stomach cancer Neg Hx    Social History   Socioeconomic History   Marital status: Married    Spouse name: Not on file   Number of children: Not on file   Years of education: Not on file   Highest education level: Not on file  Occupational History   Not on file  Tobacco Use   Smoking status: Never   Smokeless tobacco: Never  Vaping Use   Vaping Use: Never used  Substance and Sexual Activity   Alcohol use: No    Alcohol/week: 0.0 standard drinks   Drug use: No   Sexual activity: Yes    Partners: Male    Birth control/protection: Surgical    Comment: TUBAL LIGATION, 1st sexual encounter at 57, has had 5   Other Topics Concern   Not on file  Social History Narrative   Not on file   Social Determinants of Health   Financial Resource Strain: Not on file  Food Insecurity: Not on file  Transportation Needs: Not on file  Physical Activity: Not on file  Stress: Not on file  Social  Connections: Not on file   Past Surgical History:  Procedure Laterality Date   [place]   with BTL   COLONOSCOPY  2017   Dr.Armbruster   COLONOSCOPY W/ POLYPECTOMY  2006   BENIGN ADENOMA    POLYPECTOMY     TONSILLECTOMY AND ADENOIDECTOMY     Past Surgical History:  Procedure Laterality Date   CESAREAN SECTION  1993   with BTL   COLONOSCOPY  2017   Dr.Armbruster   COLONOSCOPY W/ POLYPECTOMY  2006   BENIGN ADENOMA    POLYPECTOMY     TONSILLECTOMY AND ADENOIDECTOMY     Past Medical History:  Diagnosis Date   Allergy    seasonal   Arthritis    Diabetes mellitus without complication ([place])    Fibromyalgia    dr. Marveen Reeks   GERD (gastroesophageal reflux disease)    Hyperlipidemia    Hypertension    Wheezes    with season changes    LMP 02/24/2008   Opioid  Risk Score:   Fall Risk Score:  `1  Depression screen PHQ 2/9  Depression screen [facility] 2/9 11/05/2020 01/11/2020 11/02/2019 08/04/2019 10/30/2018 08/17/2017 08/11/2016  Decreased Interest 0 0 0 1 0 0 0  Down, Depressed, Hopeless 0 0 0 1 0 0 0  PHQ - 2 Score 0 0 0 2 0 0 0  Altered sleeping - - - 3 - - -  Tired, decreased energy - - - 1 - - -  Change in appetite - - - 0 - - -  Feeling bad or failure about yourself  - - - 0 - - -  Trouble concentrating - - - 0 - - -  Moving slowly or fidgety/restless - - - 0 - - -  Suicidal thoughts - - - 0 - - -  PHQ-9 Score - - - 6 - - -  Difficult doing work/chores - - - Somewhat difficult - - -   Review of Systems  Cardiovascular:  Negative for chest pain.  Musculoskeletal:  Positive for back pain.       Pain in both hands, pain in both upper thighs  Neurological:        Tingling  All other systems reviewed and are negative.      Objective:   Physical Exam Gen: no distress, normal appearing HEENT: oral mucosa pink and moist, NCAT Cardio: Reg rate Chest: normal effort, normal rate of breathing Abd: soft, non-distended Ext: no edema Psych: pleasant, normal affect Skin: intact Neuro: Alert and oriented x3 Musculoskeletal: tight cervical paraspinals and trapezius bilaterally.  Psych: pleasant, normal affect     Assessment & Plan:  Mrs. Salm is a 60 year old woman who presents for f/u of cervical myofascial pain syndrome, fibromyalgia, and bilateral shoulder pain.   1) Chronic Pain Syndrome secondary to fibromyalgia.  -Discussed current symptoms of pain and history of pain.  -Provided with pain relief journal.  -recommended using turmeric  -restart using ginger.  -Discussed benefits of exercise in reducing pain. -Discussed following foods that may reduce pain: 1) Ginger (especially studied for arthritis)- reduce leukotriene production to decrease inflammation 2) Blueberries- high in phytonutrients that decrease inflammation 3) Salmon-  marine omega-3s reduce joint swelling and pain 4) Pumpkin seeds- reduce inflammation 5) dark chocolate- reduces inflammation 6) turmeric- reduces inflammation 7) tart cherries - reduce pain and stiffness 8) extra virgin olive oil - its compound olecanthal helps to block prostaglandins  9) chili peppers- can be eaten or applied topically  via capsaicin 10) mint- helpful for headache, muscle aches, joint pain, and itching 11) garlic- reduces inflammation  Link to further information on diet for chronic pain: [email or web address]   -She does not require refill of Gabapentin- continue taking '300mg'$  TID.  -Continue resistance training exercises.  -Vitamin D level reviewed: 32 in April.   2) Cervical myofascial pain syndrome -Will schedule trigger point injections for next session.  -continue Lidoderm patch 1 patch as needed.  -discussed Biowave and my patients' weakness.   3) Overweight: -Educated that current weight is 170 lbs. -Educated regarding health benefits of weight loss- for pain, general health, chronic disease prevention, immune health, mental health.  -Will monitor weight every visit.  -Consider Roobois tea daily.  -Discussed the benefits of intermittent fasting. -Discussed foods that can assist in weight loss: 1) leafy greens- high in fiber and nutrients 2) dark chocolate- improves metabolism (if prefer sweetened, best to sweeten with honey instead of sugar).  3) cruciferous vegetables- high in fiber and protein 4) full fat yogurt: high in healthy fat, protein, calcium, and probiotics 5) apples- high in a variety of phytochemicals 6) nuts- high in fiber and protein that increase feelings of fullness 7) grapefruit: rich in nutrients, antioxidants, and fiber (not to be taken with anticoagulation) 8) beans- high in protein and fiber 9) salmon- has high quality protein and healthy fats 10) green tea- rich in  polyphenols 11) eggs- rich in choline and vitamin D 12) tuna- high protein, boosts metabolism 13) avocado- decreases visceral abdominal fat 14) chicken (pasture raised): high in protein and iron 15) blueberries- reduce abdominal fat and cholesterol 16) whole grains- decreases calories retained during digestion, speeds metabolism 17) chia seeds- curb appetite 18) chilies- increases fat metabolism  -continue green tea supplement.  -Discussed supplements that can be used:  1) Metatrim '400mg'$  BID 30 minutes before breakfast and dinner  2) Sphaeranthus indicus and Garcinia mangostana (combinations of these and #1 can be found in capsicum and zychrome  3) green coffee bean extract '400mg'$  twice per day or Irvingia (african mango) 150 to '300mg'$  twice per day.  4) HTN: -On Norvasc '10mg'$  daily and Lisinopril- recommended weaning Lisinopril to '10mg'$  first next visit with her PCP- she will discuss with her.  -Advised checking BP daily at home and logging results to bring into follow-up appointment with her PCP and myself. -Reviewed BP meds today.  -Advised regarding healthy foods that can help lower blood pressure and provided with a list: 1) citrus foods- high in vitamins and minerals 2) salmon and other fatty fish - reduces inflammation and oxylipins 3) swiss chard (leafy green)- high level of nitrates 4) pumpkin seeds- one of the best natural sources of magnesium 5) Beans and lentils- high in fiber, magnesium, and potassium 6) Berries- high in flavonoids 7) Amaranth (whole grain, can be cooked similarly to rice and oats)- high in magnesium and fiber 8) Pistachios- even more effective at reducing BP than other nuts 9) Carrots- high in phenolic compounds that relax blood vessels and reduce inflammation 10) Celery- contain phthalides that relax tissues of arterial walls 11) Tomatoes- can also improve cholesterol and reduce risk of heart disease 12) Broccoli- good source of magnesium, calcium, and  potassium 13) Greek yogurt: high in potassium and calcium 14) Herbs and spices: Celery seed, cilantro, saffron, lemongrass, black cumin, ginseng, cinnamon, cardamom, sweet basil, and ginger 15) Chia and flax seeds- also help to lower cholesterol and blood sugar 16) Beets- high levels of nitrates that relax blood vessels  17) spinach and bananas- high in potassium  -Provided lise of supplements that can help with hypertension:  1) magnesium: one high quality brand is Bioptemizers since it contains all 7 types of magnesium, otherwise over the counter magnesium gluconate '400mg'$  is a good option 2) B vitamins 3) vitamin D 4) potassium 5) CoQ10 6) L-arginine 7) Vitamin C 8) Beetroot -Educated that goal BP is 120/80. -Made goal to incorporate some of the above foods into diet.    5) Insomnia: -Try to go outside near sunrise -Get exercise during the day.  -Discussed good sleep hygiene: turning off all devices an hour before bedtime.  -Chamomile tea with dinner.  -Can consider over the counter melatonin  6) HLD:  -Lipid panel reviewed and is within normal limits.  -Discussed with her if she wants to go off her statin. Discussed weaning to every other day.   7) Left lateral femoral cutaneous neuropathy -educated that this benign.   8) Low vitamin D: discussed that her Vitamin D level is 32 which is at the low end of normal. I recommend 50,000U ergocalciferol once per week for 7 weeks.   9) Prediabetes -HgbA1c is 6.3  -check CBGs daily, log, and bring log to follow-up appointment -avoid sugar, bread, pasta, rice -avoid snacking -try to incorporate into your diet some of the following foods which are good for diabetes: 1) cinnamon- imitates effects of insulin, increasing glucose transport into cells ([place] or [place] cinnamon is best, least processed) 2) nuts- can slow down the blood sugar response of carbohydrate rich foods 3) oatmeal- contains and anti-inflammatory compound  avenanthramide 4) whole-milk yogurt (best types are no sugar, [place] yogurt, or goat/sheep yogurt) 5) beans- high in protein, fiber, and vitamins, low glycemic index 6) broccoli- great source of vitamin A and C 7) quinoa- higher in protein and fiber than other grains 8) spinach- high in vitamin A, fiber, and protein 9) olive oil- reduces glucose levels, LDL, and triglycerides 10) salmon- excellent amount of omega-3-fatty acids 11) walnuts- rich in antioxidants 12) apples- high in fiber and quercetin 13) carrots- highly nutritious with low impact on blood sugar 14) eggs- improve HDL (good cholesterol), high in protein, keep you satiated 15) turmeric: improves blood sugars, cardiovascular disease, and protects kidney health 16) garlic: improves blood sugar, blood pressure, pain 17) tomatoes: highly nutritious with low impact on blood sugar   52 minutes spent in chart review of patient's medical problems, medications, labs, other providers' notes, updating her chart, discussing differences between Biowave and TENS, discussed the benefits of pomegranate juice, and tumeric.

## 2020-12-25 NOTE — Telephone Encounter (Signed)
Courtney Guzman called again about her gabapentin.  I sent an rx to the pharmacy yesterday but they did not receive.  I have called a 30 day supply and she is to make an appt for further refills.

## 2020-12-25 NOTE — Patient Instructions (Addendum)
Foods that may reduce pain: 1) Ginger (especially studied for arthritis)- reduce leukotriene production to decrease inflammation 2) Blueberries- high in phytonutrients that decrease inflammation 3) Salmon- marine omega-3s reduce joint swelling and pain 4) Pumpkin seeds- reduce inflammation 5) dark chocolate- reduces inflammation 6) turmeric- reduces inflammation 7) tart cherries - reduce pain and stiffness 8) extra virgin olive oil - its compound olecanthal helps to block prostaglandins  9) chili peppers- can be eaten or applied topically via capsaicin 10) mint- helpful for headache, muscle aches, joint pain, and itching 11) garlic- reduces inflammation  Link to further information on diet for chronic pain: [email or web address]   Foods that help with weight loss -Will monitor weight every visit.  -Consider Roobois tea daily.  -intermittent fasting can benefit 1) leafy greens- high in fiber and nutrients 2) dark chocolate- improves metabolism (if prefer sweetened, best to sweeten with honey instead of sugar).  3) cruciferous vegetables- high in fiber and protein 4) full fat yogurt: high in healthy fat, protein, calcium, and probiotics 5) apples- high in a variety of phytochemicals 6) nuts- high in fiber and protein that increase feelings of fullness 7) grapefruit: rich in nutrients, antioxidants, and fiber (not to be taken with anticoagulation) 8) beans- high in protein and fiber 9) salmon- has high quality protein and healthy fats 10) green tea- rich in polyphenols 11) eggs- rich in choline and vitamin D 12) tuna- high protein, boosts metabolism 13) avocado- decreases visceral abdominal fat 14) chicken (pasture raised): high in protein and iron 15) blueberries- reduce abdominal fat and cholesterol 16) whole grains- decreases calories retained during digestion, speeds metabolism 17) chia seeds- curb appetite 18)  chilies- increases fat metabolism  -Discussed supplements that can be used:  1) Metatrim '400mg'$  BID 30 minutes before breakfast and dinner  2) Sphaeranthus indicus and Garcinia mangostana (combinations of these and #1 can be found in capsicum and zychrome  3) green coffee bean extract '400mg'$  twice per day or Irvingia (african mango) 150 to '300mg'$  twice per day.  HTN: 1) citrus foods- high in vitamins and minerals 2) salmon and other fatty fish - reduces inflammation and oxylipins 3) swiss chard (leafy green)- high level of nitrates 4) pumpkin seeds- one of the best natural sources of magnesium 5) Beans and lentils- high in fiber, magnesium, and potassium 6) Berries- high in flavonoids 7) Amaranth (whole grain, can be cooked similarly to rice and oats)- high in magnesium and fiber 8) Pistachios- even more effective at reducing BP than other nuts 9) Carrots- high in phenolic compounds that relax blood vessels and reduce inflammation 10) Celery- contain phthalides that relax tissues of arterial walls 11) Tomatoes- can also improve cholesterol and reduce risk of heart disease 12) Broccoli- good source of magnesium, calcium, and potassium 13) Greek yogurt: high in potassium and calcium 14) Herbs and spices: Celery seed, cilantro, saffron, lemongrass, black cumin, ginseng, cinnamon, cardamom, sweet basil, and ginger 15) Chia and flax seeds- also help to lower cholesterol and blood sugar 16) Beets- high levels of nitrates that relax blood vessels  17) spinach and bananas- high in potassium  -Supplements that can help with hypertension:  1) magnesium: one high quality brand is Bioptemizers since it contains all 7 types of magnesium, otherwise over the counter magnesium gluconate '400mg'$  is a good option 2) B vitamins 3) vitamin D 4) potassium 5) CoQ10 6) L-arginine 7) Vitamin C 8) Beetroot -Educated that goal BP is 120/80. -Made goal to incorporate some of the above foods into diet.  Insomnia: -Try to go outside near sunrise -Get exercise during the day.  -Discussed good sleep hygiene: turning off all devices an hour before bedtime.  -Chamomile tea with dinner.  -Can consider over the counter melatonin  Prediabetes -avoid sugar, bread, pasta, rice -avoid snacking -try to incorporate into your diet some of the following foods which are good for diabetes: 1) cinnamon- imitates effects of insulin, increasing glucose transport into cells ([place] or [place] cinnamon is best, least processed) 2) nuts- can slow down the blood sugar response of carbohydrate rich foods 3) oatmeal- contains and anti-inflammatory compound avenanthramide 4) whole-milk yogurt (best types are no sugar, [place] yogurt, or goat/sheep yogurt) 5) beans- high in protein, fiber, and vitamins, low glycemic index 6) broccoli- great source of vitamin A and C 7) quinoa- higher in protein and fiber than other grains 8) spinach- high in vitamin A, fiber, and protein 9) olive oil- reduces glucose levels, LDL, and triglycerides 10) salmon- excellent amount of omega-3-fatty acids 11) walnuts- rich in antioxidants 12) apples- high in fiber and quercetin 13) carrots- highly nutritious with low impact on blood sugar 14) eggs- improve HDL (good cholesterol), high in protein, keep you satiated 15) turmeric: improves blood sugars, cardiovascular disease, and protects kidney health 16) garlic: improves blood sugar, blood pressure, pain 17) tomatoes: highly nutritious with low impact on blood sugar

## 2020-12-27 ENCOUNTER — Telehealth: Payer: Self-pay

## 2020-12-27 NOTE — Telephone Encounter (Signed)
Patient called stating that Vit D regiment given and she thought it was suppose to be for 7 weeks but when she picked it up it was for 4 weeks. Also can she take her regular Vit D along with it.

## 2020-12-27 NOTE — Telephone Encounter (Signed)
Dr Aline August response left in a detailed message on mobile # per DPR.

## 2021-01-09 ENCOUNTER — Other Ambulatory Visit: Payer: Self-pay | Admitting: Physical Medicine and Rehabilitation

## 2021-01-28 ENCOUNTER — Other Ambulatory Visit: Payer: Self-pay | Admitting: Physical Medicine and Rehabilitation

## 2021-02-06 ENCOUNTER — Other Ambulatory Visit: Payer: Self-pay

## 2021-02-06 MED ORDER — GABAPENTIN 300 MG PO CAPS: ORAL_CAPSULE | ORAL | 0 refills | Status: DC

## 2021-02-11 ENCOUNTER — Telehealth: Payer: Self-pay

## 2021-02-11 MED ORDER — GABAPENTIN 300 MG PO CAPS: ORAL_CAPSULE | ORAL | 2 refills | Status: DC

## 2021-02-11 NOTE — Telephone Encounter (Signed)
Rx faxed

## 2021-02-15 ENCOUNTER — Other Ambulatory Visit: Payer: Self-pay | Admitting: Physical Medicine and Rehabilitation

## 2021-03-21 ENCOUNTER — Encounter: Payer: Self-pay | Admitting: Family

## 2021-03-21 ENCOUNTER — Ambulatory Visit: Payer: 59 | Admitting: Family

## 2021-03-21 VITALS — BP 122/80 | HR 68 | Temp 98.0°F | Ht 60.0 in | Wt 170.2 lb

## 2021-03-21 DIAGNOSIS — E785 Hyperlipidemia, unspecified: Secondary | ICD-10-CM

## 2021-03-21 DIAGNOSIS — R7303 Prediabetes: Secondary | ICD-10-CM

## 2021-03-21 DIAGNOSIS — I10 Essential (primary) hypertension: Secondary | ICD-10-CM | POA: Diagnosis not present

## 2021-03-21 LAB — BASIC METABOLIC PANEL
BUN: 10 mg/dL (ref 6–23)
CO2: 30 mEq/L (ref 19–32)
Calcium: 9.8 mg/dL (ref 8.4–10.5)
Chloride: 103 mEq/L (ref 96–112)
Creatinine, Ser: 0.63 mg/dL (ref 0.40–1.20)
GFR: 95.92 mL/min (ref >60.00)
Glucose, Bld: 108 mg/dL — ABNORMAL HIGH (ref 70–99)
Potassium: 4.4 mEq/L (ref 3.5–5.1)
Sodium: 139 mEq/L (ref 135–145)

## 2021-03-21 LAB — LIPID PANEL
Cholesterol: 231 mg/dL — ABNORMAL HIGH (ref 0–200)
HDL: 68.7 mg/dL (ref >39.00)
LDL Cholesterol: 145 mg/dL — ABNORMAL HIGH (ref 0–99)
NonHDL: 162.32
Total CHOL/HDL Ratio: 3
Triglycerides: 87 mg/dL (ref 0.0–149.0)
VLDL: 17.4 mg/dL (ref 0.0–40.0)

## 2021-03-21 LAB — HEMOGLOBIN A1C: Hgb A1c MFr Bld: 6.3 % (ref 4.6–6.5)

## 2021-03-21 MED ORDER — VALSARTAN 80 MG PO TABS: 80.0000 mg | ORAL_TABLET | Freq: Every day | ORAL | 1 refills | Status: DC

## 2021-03-21 MED ORDER — VALSARTAN 40 MG PO TABS: 40.0000 mg | ORAL_TABLET | Freq: Every day | ORAL | 1 refills | Status: DC

## 2021-03-21 NOTE — Progress Notes (Signed)
Courtney Guzman is a 60 y.o. female with the following history as recorded in EpicCare:  Patient Active Problem List   Diagnosis Date Noted   Osteoarthritis 10/30/2018   Iron deficiency anemia 10/30/2018   Essential hypertension 08/11/2016   Primary insomnia 07/01/2016   Type 2 diabetes mellitus without complication, without long-term current use of insulin ([place]) 06/14/2015   Hyperlipidemia 05/10/2014   Osteopenia 03/22/2012   GERD (gastroesophageal reflux disease)    Fibromyalgia syndrome 07/26/2008    Current Outpatient Medications  Medication Sig Dispense Refill   albuterol (VENTOLIN HFA) 108 (90 Base) MCG/ACT inhaler INHALE 2 PUFFS BY MOUTH EVERY 4 HOURS AS NEEDED FOR WHEEZE 3 g 3   amLODipine (NORVASC) 10 MG tablet Take 1 tablet (10 mg total) by mouth daily. 90 tablet 3   calcium carbonate (OS-CAL) 600 MG TABS Take 600 mg by mouth 2 (two) times daily with a meal.     COMBIGAN 0.2-0.5 % ophthalmic solution Apply 1 drop to eye 2 (two) times daily.     ferrous sulfate 325 (65 FE) MG tablet Take 325 mg by mouth daily with breakfast.     fexofenadine (ALLEGRA) 30 MG tablet Take 30 mg by mouth 2 (two) times daily.     fish oil-omega-3 fatty acids 1000 MG capsule Take 2 g by mouth daily.     gabapentin (NEURONTIN) 300 MG capsule TAKE 1 CAPSULE BY MOUTH THREE TIMES A DAY 90 capsule 2   Green Tea, Camillia sinensis, (GREEN TEA PO) Take by mouth.     latanoprost (XALATAN) 0.005 % ophthalmic solution Place into both eyes at bedtime.      lidocaine (LIDODERM) 5 % Place 1 patch onto the skin daily. Remove & Discard patch within 12 hours or as directed by MD 30 patch 2   Multiple Vitamin (MULTIVITAMIN) tablet Take 1 tablet by mouth daily.     multivitamin-lutein (OCUVITE-LUTEIN) CAPS capsule Take 1 capsule by mouth daily.     omeprazole (PRILOSEC) 40 MG capsule Take 1 capsule (40 mg total) by mouth daily. 90 capsule 3   tiZANidine (ZANAFLEX) 4 MG tablet TAKE 1 TABLET BY MOUTH AT BEDTIME AS NEEDED  FOR MUSCLE SPASMS. 30 tablet 5   Turmeric (QC TUMERIC COMPLEX PO) Take 1,000 mg by mouth daily.     valsartan (DIOVAN) 40 MG tablet Take 1 tablet (40 mg total) by mouth daily. 90 tablet 1   zoledronic acid (RECLAST) 5 MG/100ML SOLN injection Inject 5 mg into the vein once.     atorvastatin (LIPITOR) 10 MG tablet Take 1 tablet (10 mg total) by mouth daily at 6 PM. (Patient not taking: Reported on 03/21/2021) 90 tablet 3   Vitamin D, Ergocalciferol, (DRISDOL) 1.25 MG (50000 UNIT) CAPS capsule Take 1 capsule (50,000 Units total) by mouth every 7 (seven) days. (Patient not taking: Reported on 03/21/2021) 7 capsule 0   No current facility-administered medications for this visit.    Allergies: Patient has no known allergies.  Past Medical History:  Diagnosis Date   Allergy    seasonal   Arthritis    Diabetes mellitus without complication ([place])    Fibromyalgia    dr. Marveen Reeks   GERD (gastroesophageal reflux disease)    Hyperlipidemia    Hypertension    Wheezes    with season changes     Past Surgical History:  Procedure Laterality Date   CESAREAN SECTION  1993   with BTL   COLONOSCOPY  2017   Dr.Armbruster   COLONOSCOPY W/  POLYPECTOMY  2006   BENIGN ADENOMA    POLYPECTOMY     TONSILLECTOMY AND ADENOIDECTOMY      Family History  Problem Relation Age of Onset   Hypertension Mother    Diabetes Sister    Cancer Maternal Grandmother        OVARIAN   Ovarian cancer Maternal Grandmother    Colon cancer Neg Hx    Colon polyps Neg Hx    Esophageal cancer Neg Hx    Rectal cancer Neg Hx    Stomach cancer Neg Hx     Social History   Tobacco Use   Smoking status: Never   Smokeless tobacco: Never  Substance Use Topics   Alcohol use: No    Alcohol/week: 0.0 standard drinks    Subjective:  Patient presents to follow up on hypertension/ pre-diabetes; Patient is asking to change off the Lisinopril- does not feel comfortable continuing this medication. Denies any cough but is  concerned it has too many side effects;  Also notes she has stopped taking her Lipitor in the past 2-3 months; "I just want to stop taking so much medication."  Denies any chest pain, shortness of breath, blurred vision or headache     Objective:  Vitals:   03/21/21 0818  BP: 122/80  Pulse: 68  Temp: 98 F (36.7 C)  TempSrc: Oral  SpO2: 98%  Weight: 170 lb 3.2 oz (77.2 kg)  Height: 5' (1.524 m)    General: Well developed, well nourished, in no acute distress  Skin : Warm and dry.  Head: Normocephalic and atraumatic  Lungs: Respirations unlabored; clear to auscultation bilaterally without wheeze, rales, rhonchi  CVS exam: normal rate and regular rhythm.  Neurologic: Alert and oriented; speech intact; face symmetrical; moves all extremities well; CNII-XII intact without focal deficit   Assessment:  1. Pre-diabetes   2. Hyperlipidemia, unspecified hyperlipidemia type   3. Essential hypertension     Plan:  Check Hgba1c today; Patient has been off Lipitor x 2-3 months; update lipid panel; follow up to be determined; D/C Lisinopril per patient request; change to Diovan 40 mg qhs; continue Amlodipine 10 mg; she will continue to monitor her blood pressure and call back with her response in 2 weeks;     This visit occurred during the SARS-CoV-2 public health emergency.  Safety protocols were in place, including screening questions prior to the visit, additional usage of staff PPE, and extensive cleaning of exam room while observing appropriate contact time as indicated for disinfecting solutions.   No follow-ups on file.  Orders Placed This Encounter  Procedures   Basic Metabolic Panel (BMET)   Hemoglobin A1c   Lipid panel    Requested Prescriptions   Signed Prescriptions Disp Refills   valsartan (DIOVAN) 40 MG tablet 90 tablet 1    Sig: Take 1 tablet (40 mg total) by mouth daily.

## 2021-03-28 ENCOUNTER — Ambulatory Visit: Payer: 59 | Admitting: Physical Medicine and Rehabilitation

## 2021-04-18 ENCOUNTER — Ambulatory Visit: Payer: 59 | Admitting: Physical Medicine and Rehabilitation

## 2021-04-28 ENCOUNTER — Encounter: Payer: Self-pay | Admitting: Family

## 2021-04-29 ENCOUNTER — Other Ambulatory Visit: Payer: Self-pay | Admitting: Family

## 2021-04-29 DIAGNOSIS — M810 Age-related osteoporosis without current pathological fracture: Secondary | ICD-10-CM

## 2021-04-29 MED ORDER — VALSARTAN 80 MG PO TABS: 80.0000 mg | ORAL_TABLET | Freq: Every day | ORAL | 0 refills | Status: DC

## 2021-05-06 ENCOUNTER — Encounter: Payer: Self-pay | Admitting: Family

## 2021-05-06 ENCOUNTER — Ambulatory Visit: Payer: 59 | Admitting: Family

## 2021-05-06 VITALS — BP 132/70 | HR 76 | Temp 98.5°F | Resp 18 | Ht 60.0 in | Wt 174.0 lb

## 2021-05-06 DIAGNOSIS — I10 Essential (primary) hypertension: Secondary | ICD-10-CM

## 2021-05-06 MED ORDER — VALSARTAN 80 MG PO TABS: 80.0000 mg | ORAL_TABLET | Freq: Every evening | ORAL | 2 refills | Status: DC

## 2021-05-06 NOTE — Progress Notes (Signed)
Courtney Guzman is a 61 y.o. female with the following history as recorded in EpicCare:  Patient Active Problem List   Diagnosis Date Noted   Osteoarthritis 10/30/2018   Iron deficiency anemia 10/30/2018   Essential hypertension 08/11/2016   Primary insomnia 07/01/2016   Type 2 diabetes mellitus without complication, without long-term current use of insulin ([place]) 06/14/2015   Hyperlipidemia 05/10/2014   Osteopenia 03/22/2012   GERD (gastroesophageal reflux disease)    Fibromyalgia syndrome 07/26/2008    Current Outpatient Medications  Medication Sig Dispense Refill   albuterol (VENTOLIN HFA) 108 (90 Base) MCG/ACT inhaler INHALE 2 PUFFS BY MOUTH EVERY 4 HOURS AS NEEDED FOR WHEEZE 3 g 3   amLODipine (NORVASC) 10 MG tablet Take 1 tablet (10 mg total) by mouth daily. 90 tablet 3   atorvastatin (LIPITOR) 10 MG tablet Take 1 tablet (10 mg total) by mouth daily at 6 PM. 90 tablet 3   calcium carbonate (OS-CAL) 600 MG TABS Take 600 mg by mouth 2 (two) times daily with a meal.     COMBIGAN 0.2-0.5 % ophthalmic solution Apply 1 drop to eye 2 (two) times daily.     ferrous sulfate 325 (65 FE) MG tablet Take 325 mg by mouth daily with breakfast.     fexofenadine (ALLEGRA) 30 MG tablet Take 30 mg by mouth 2 (two) times daily.     fish oil-omega-3 fatty acids 1000 MG capsule Take 2 g by mouth daily.     gabapentin (NEURONTIN) 300 MG capsule TAKE 1 CAPSULE BY MOUTH THREE TIMES A DAY 90 capsule 2   Green Tea, Camillia sinensis, (GREEN TEA PO) Take by mouth.     latanoprost (XALATAN) 0.005 % ophthalmic solution Place into both eyes at bedtime.      lidocaine (LIDODERM) 5 % Place 1 patch onto the skin daily. Remove & Discard patch within 12 hours or as directed by MD 30 patch 2   Multiple Vitamin (MULTIVITAMIN) tablet Take 1 tablet by mouth daily.     multivitamin-lutein (OCUVITE-LUTEIN) CAPS capsule Take 1 capsule by mouth daily.     omeprazole (PRILOSEC) 40 MG capsule Take 1 capsule (40 mg total) by  mouth daily. 90 capsule 3   tiZANidine (ZANAFLEX) 4 MG tablet TAKE 1 TABLET BY MOUTH AT BEDTIME AS NEEDED FOR MUSCLE SPASMS. 30 tablet 5   Turmeric (QC TUMERIC COMPLEX PO) Take 1,000 mg by mouth daily.     Vitamin D, Ergocalciferol, (DRISDOL) 1.25 MG (50000 UNIT) CAPS capsule Take 1 capsule (50,000 Units total) by mouth every 7 (seven) days. 7 capsule 0   zoledronic acid (RECLAST) 5 MG/100ML SOLN injection Inject 5 mg into the vein once.     valsartan (DIOVAN) 80 MG tablet Take 1 tablet (80 mg total) by mouth every evening. 90 tablet 2   No current facility-administered medications for this visit.    Allergies: Patient has no known allergies.  Past Medical History:  Diagnosis Date   Allergy    seasonal   Arthritis    Diabetes mellitus without complication ([place])    Fibromyalgia    dr. Marveen Reeks   GERD (gastroesophageal reflux disease)    Hyperlipidemia    Hypertension    Wheezes    with season changes     Past Surgical History:  Procedure Laterality Date   CESAREAN SECTION  1993   with BTL   COLONOSCOPY  2017   Dr.Armbruster   COLONOSCOPY W/ POLYPECTOMY  2006   BENIGN ADENOMA    POLYPECTOMY  TONSILLECTOMY AND ADENOIDECTOMY      Family History  Problem Relation Age of Onset   Hypertension Mother    Diabetes Sister    Cancer Maternal Grandmother        OVARIAN   Ovarian cancer Maternal Grandmother    Colon cancer Neg Hx    Colon polyps Neg Hx    Esophageal cancer Neg Hx    Rectal cancer Neg Hx    Stomach cancer Neg Hx     Social History   Tobacco Use   Smoking status: Never   Smokeless tobacco: Never  Substance Use Topics   Alcohol use: No    Alcohol/week: 0.0 standard drinks    Subjective:  Follow up on hypertension; patient was recently changed to Valsartan as she was uncomfortable about being on Lisinopril; Had called in with concerns that Valsartan dosage was too low- was increased from 40 mg to 80 mg; has been feeling much better with the change; no  further headaches; Denies any chest pain, shortness of breath, blurred vision or headache.   Objective:  Vitals:   05/06/21 0822  BP: 132/70  Pulse: 76  Resp: 18  Temp: 98.5 F (36.9 C)  TempSrc: Oral  SpO2: 98%  Weight: 174 lb (78.9 kg)  Height: 5' (1.524 m)    General: Well developed, well nourished, in no acute distress  Skin : Warm and dry.  Head: Normocephalic and atraumatic  Lungs: Respirations unlabored; clear to auscultation bilaterally without wheeze, rales, rhonchi  CVS exam: normal rate and regular rhythm.  Neurologic: Alert and oriented; speech intact; face symmetrical; moves all extremities well; CNII-XII intact without focal deficit   Assessment:  1. Essential hypertension     Plan:  Good improvement on current regimen; continue Amlodipine in the am and Diovan in the pm; plan for CPE/ follow up in 6 months;   This visit occurred during the SARS-CoV-2 public health emergency.  Safety protocols were in place, including screening questions prior to the visit, additional usage of staff PPE, and extensive cleaning of exam room while observing appropriate contact time as indicated for disinfecting solutions.    No follow-ups on file.  No orders of the defined types were placed in this encounter.   Requested Prescriptions   Signed Prescriptions Disp Refills   valsartan (DIOVAN) 80 MG tablet 90 tablet 2    Sig: Take 1 tablet (80 mg total) by mouth every evening.

## 2021-05-14 ENCOUNTER — Other Ambulatory Visit: Payer: Self-pay | Admitting: Family

## 2021-05-14 ENCOUNTER — Other Ambulatory Visit: Payer: Self-pay | Admitting: Emergency Medicine

## 2021-05-14 DIAGNOSIS — Z1231 Encounter for screening mammogram for malignant neoplasm of breast: Secondary | ICD-10-CM

## 2021-06-23 ENCOUNTER — Other Ambulatory Visit: Payer: Self-pay

## 2021-06-23 MED ORDER — GABAPENTIN 300 MG PO CAPS: ORAL_CAPSULE | ORAL | 2 refills | Status: DC

## 2021-06-23 MED ORDER — GABAPENTIN 300 MG PO CAPS
ORAL_CAPSULE | ORAL | 2 refills | Status: DC
Start: 2021-06-23 — End: 2021-07-23

## 2021-06-23 NOTE — Telephone Encounter (Signed)
[facility] Gab[facility]entin. She does not have an upcoming appointment. Would you like to refill? ?

## 2021-06-25 ENCOUNTER — Telehealth: Payer: Self-pay

## 2021-06-25 NOTE — Telephone Encounter (Signed)
Refill request for Gabapentin 300 mg. Refill sent on 06/23/21 ?

## 2021-06-26 ENCOUNTER — Ambulatory Visit
Admission: RE | Admit: 2021-06-26 | Discharge: 2021-06-26 | Disposition: A | Discharge status: Home / Self Care | Payer: 59 | Source: Ambulatory Visit

## 2021-06-26 DIAGNOSIS — Z1231 Encounter for screening mammogram for malignant neoplasm of breast: Secondary | ICD-10-CM

## 2021-07-07 LAB — HM DEXA SCAN

## 2021-07-10 ENCOUNTER — Telehealth: Payer: Self-pay | Admitting: *Deleted

## 2021-07-10 ENCOUNTER — Encounter: Payer: Self-pay | Admitting: Family

## 2021-07-10 ENCOUNTER — Encounter: Payer: Self-pay | Admitting: Rheumatology

## 2021-07-10 NOTE — Telephone Encounter (Signed)
Received DEXA results from [facility]. ? ?Date of Scan: 07/07/2021 ? ?Lowest T-score:-1.6 ? ?BMD:0.866 ? ?Lowest site measured:AP Spine ? ?DX: Osteopenia ? ?Significant changes in BMD and site measured (5% and above):n/a ? ?Current Regimen:Calcium Vitamin D and Reclast (last infusion 08/27/2020) ? ?Recommendation:Will discuss at follow up visit.  ? ?Reviewed by: Dr. Bo Merino ? ?Next Appointment:  07/31/2021 ?

## 2021-07-10 NOTE — Progress Notes (Signed)
Dexa scan abstracted, HM updated. ?

## 2021-07-17 ENCOUNTER — Encounter: Payer: Self-pay | Admitting: Family

## 2021-07-17 ENCOUNTER — Other Ambulatory Visit: Payer: Self-pay | Admitting: Family

## 2021-07-17 MED ORDER — SPIRONOLACTONE 25 MG PO TABS: 25.0000 mg | ORAL_TABLET | Freq: Every day | ORAL | 0 refills | Status: DC

## 2021-07-17 NOTE — Progress Notes (Signed)
? ?Office Visit Note ? ?Patient: Courtney Guzman             ?Date of Birth: Jan 05, 1961           ?MRN: [id number]             ?PCP: Marrian Salvage, Guys ?Referring: Jearld Fenton, NP ?Visit Date: 07/31/2021 ?Occupation: '@GUAROCC'$ @ ? ?Subjective:  ?Discuss DEXA results  ? ?History of Present Illness: Courtney Guzman is a 61 y.o. female with history of osteoporosis and fibromyalgia.  She received her first Reclast infusion on 08/10/2019 and the second reclast infusion on 08/27/20.  She tolerated the most recent Reclast infusion without any side effects.  She has been taking a calcium and vitamin D supplement as recommended.  She denies any recent falls or fractures.  She had an updated bone density on 07/07/2021 which she would like to review today in the office. ?Patient reports that she has been seeing pain management for management treatment of her fibromyalgia.  She remains on gabapentin as prescribed which has been alleviating her symptoms.  She denies any joint swelling at this time. ? ? ? ?Activities of Daily Living:  ?Patient reports morning stiffness for couple minutes  ?Patient Denies nocturnal pain.  ?Difficulty dressing/grooming: Denies ?Difficulty climbing stairs: Denies ?Difficulty getting out of chair: Denies ?Difficulty using hands for taps, buttons, cutlery, and/or writing: Denies ? ?Review of Systems  ?Constitutional:  Negative for fatigue.  ?HENT:  Negative for mouth sores, mouth dryness and nose dryness.   ?Eyes:  Negative for pain, visual disturbance and dryness.  ?Respiratory:  Negative for cough, hemoptysis, shortness of breath and difficulty breathing.   ?Cardiovascular:  Negative for chest pain, palpitations, hypertension and swelling in legs/feet.  ?Gastrointestinal:  Negative for blood in stool, constipation and diarrhea.  ?Endocrine: Negative for increased urination.  ?Genitourinary:  Negative for painful urination.  ?Musculoskeletal:  Positive for myalgias, morning stiffness, muscle  tenderness and myalgias. Negative for joint pain, joint pain, joint swelling and muscle weakness.  ?Skin:  Negative for color change, pallor, rash, hair loss, nodules/bumps, skin tightness, ulcers and sensitivity to sunlight.  ?Allergic/Immunologic: Negative for susceptible to infections.  ?Neurological:  Negative for dizziness, numbness, headaches and weakness.  ?Hematological:  Negative for swollen glands.  ?Psychiatric/Behavioral:  Negative for depressed mood and sleep disturbance. The patient is not nervous/anxious.   ? ?PMFS History:  ?Patient Active Problem List  ? Diagnosis Date Noted  ? Osteoarthritis 10/30/2018  ? Iron deficiency anemia 10/30/2018  ? Essential hypertension 08/11/2016  ? Primary insomnia 07/01/2016  ? Type 2 diabetes mellitus without complication, without long-term current use of insulin ([place]) 06/14/2015  ? Hyperlipidemia 05/10/2014  ? Osteopenia 03/22/2012  ? GERD (gastroesophageal reflux disease)   ? Fibromyalgia syndrome 07/26/2008  ?  ?Past Medical History:  ?Diagnosis Date  ? Allergy   ? seasonal  ? Arthritis   ? Diabetes mellitus without complication ([place])   ? Fibromyalgia   ? dr. Marveen Reeks  ? GERD (gastroesophageal reflux disease)   ? Hyperlipidemia   ? Hypertension   ? Wheezes   ? with season changes   ?  ?Family History  ?Problem Relation Age of Onset  ? Hypertension Mother   ? Diabetes Sister   ? Cancer Maternal Grandmother   ?     OVARIAN  ? Ovarian cancer Maternal Grandmother   ? Colon cancer Neg Hx   ? Colon polyps Neg Hx   ? Esophageal cancer Neg Hx   ? Rectal  cancer Neg Hx   ? Stomach cancer Neg Hx   ? ?Past Surgical History:  ?Procedure Laterality Date  ? [place]  ? with BTL  ? COLONOSCOPY  2017  ? Dr.Armbruster  ? COLONOSCOPY W/ POLYPECTOMY  2006  ? BENIGN ADENOMA   ? POLYPECTOMY    ? TONSILLECTOMY AND ADENOIDECTOMY    ? ?Social History  ? ?Social History Narrative  ? Not on file  ? ?Immunization History  ?Administered Date(s) Administered  ?  Influenza,inj,Quad PF,6+ Mos 01/18/2014, 02/19/2016  ? Influenza-Unspecified 01/27/2017  ? PFIZER(Purple Top)SARS-COV-2 Vaccination 07/21/2019, 08/22/2019  ? PPD Test 03/17/2011  ? Tdap 04/11/2013, 03/08/2016  ?  ? ?Objective: ?Vital Signs: BP 118/79 (BP Location: Left Arm, Patient Position: Sitting, Cuff Size: Normal)   Pulse 66   Resp 16   Ht 5' (1.524 m)   Wt 171 lb (77.6 kg)   LMP 02/24/2008   BMI 33.40 kg/m?   ? ?Physical Exam ?Vitals and nursing note reviewed.  ?Constitutional:   ?   Appearance: She is well-developed.  ?HENT:  ?   Head: Normocephalic and atraumatic.  ?Eyes:  ?   Conjunctiva/sclera: Conjunctivae normal.  ?Cardiovascular:  ?   Rate and Rhythm: Normal rate and regular rhythm.  ?   Heart sounds: Normal heart sounds.  ?Pulmonary:  ?   Effort: Pulmonary effort is normal.  ?   Breath sounds: Normal breath sounds.  ?Abdominal:  ?   General: Bowel sounds are normal.  ?   Palpations: Abdomen is soft.  ?Musculoskeletal:  ?   Cervical back: Normal range of motion.  ?Skin: ?   General: Skin is warm and dry.  ?   Capillary Refill: Capillary refill takes less than 2 seconds.  ?Neurological:  ?   Mental Status: She is alert and oriented to person, place, and time.  ?Psychiatric:     ?   Behavior: Behavior normal.  ?  ? ?Musculoskeletal Exam: C-spine, thoracic spine, and lumbar spine have good range of motion with no discomfort.  Shoulder joints, elbow joints, wrist joints, MCPs, PIPs, DIPs have good range of motion with no synovitis.  She was able to make a complete fist bilaterally.  Hip joints have good range of motion with no groin pain.  Tenderness palpation over the left trochanteric bursa.  Knee joints have good range of motion with no warmth or effusion.  Ankle joints have good range of motion with no tenderness or joint swelling. ? ?CDAI Exam: ?CDAI Score: -- ?Patient Global: --; Provider Global: -- ?Swollen: --; Tender: -- ?Joint Exam 07/31/2021  ? ?No joint exam has been documented for this  visit  ? ?There is currently no information documented on the homunculus. Go to the Rheumatology activity and complete the homunculus joint exam. ? ?Investigation: ?No additional findings. ? ?Imaging: ?No results found. ? ?Recent Labs: ?Lab Results  ?Component Value Date  ? WBC 6.7 11/05/2020  ? HGB 12.8 11/05/2020  ? PLT 311.0 11/05/2020  ? NA 139 03/21/2021  ? K 4.4 03/21/2021  ? CL 103 03/21/2021  ? CO2 30 03/21/2021  ? GLUCOSE 108 (H) 03/21/2021  ? BUN 10 03/21/2021  ? CREATININE 0.63 03/21/2021  ? BILITOT 0.6 11/05/2020  ? ALKPHOS 68 11/05/2020  ? AST 29 11/05/2020  ? ALT 25 11/05/2020  ? PROT 7.3 11/05/2020  ? ALBUMIN 4.7 11/05/2020  ? CALCIUM 9.8 03/21/2021  ? GFRAA 112 08/08/2020  ? ? ?Speciality Comments: No specialty comments available. ? ?Procedures:  ?  No procedures performed ?Allergies: Sulfa antibiotics  ? ?Assessment / Plan:     ?Visit Diagnoses: Age-related osteoporosis without current pathological fracture - DEXA updated on 07/07/2021: AP spine BMD 0.866 with T score -1.6.  Statistically significant decrease in BMD of right hip.  AP spine and left hip stable.  DEXA 06/29/2019: AP spine 0.856 with T-score: -2.7.  Previous DEXA on 07/31/13: AP spine BMD 0.839 with T-score -1.9.  ?DEXA results were reviewed with the patient today in detail and all questions were addressed.  Discussed that we currently recommend a drug holiday from [place] since her T score has improved to within the osteopenia range after 2 doses of Reclast. ?She has not had any falls or fractures. ?Therapy: First IV Reclast infusion was on 08/10/2019.  Second infusion on 08/27/20.  She tolerated the second dose of Reclast without any side effects. ?She is taking a calcium and vitamin D supplement daily as recommended.  Discussed the importance of a calcium rich diet and taking a vitamin D supplement on a daily basis.  Her vitamin D level was 32 on 08/08/2020.  She plans on having updated lab work including vitamin D with her yearly physical  in July 2023.  Discussed the importance of performing resistive exercises. ?Discussed that her next bone density will be due in March 2025. ? ?Vitamin D deficiency: Vitamin D was 32 on 08/08/2020.  She plans on have an updated vitami

## 2021-07-23 ENCOUNTER — Encounter: Payer: 59 | Attending: Registered Nurse | Admitting: Registered Nurse

## 2021-07-23 VITALS — BP 123/84 | HR 74 | Ht 60.0 in | Wt 172.0 lb

## 2021-07-23 DIAGNOSIS — M25511 Pain in right shoulder: Secondary | ICD-10-CM | POA: Diagnosis present

## 2021-07-23 DIAGNOSIS — M25512 Pain in left shoulder: Secondary | ICD-10-CM | POA: Diagnosis present

## 2021-07-23 DIAGNOSIS — M7918 Myalgia, other site: Secondary | ICD-10-CM

## 2021-07-23 DIAGNOSIS — M797 Fibromyalgia: Secondary | ICD-10-CM | POA: Diagnosis present

## 2021-07-23 DIAGNOSIS — G894 Chronic pain syndrome: Secondary | ICD-10-CM

## 2021-07-23 DIAGNOSIS — M542 Cervicalgia: Secondary | ICD-10-CM

## 2021-07-23 DIAGNOSIS — G8929 Other chronic pain: Secondary | ICD-10-CM | POA: Diagnosis present

## 2021-07-23 MED ORDER — GABAPENTIN 300 MG PO CAPS: ORAL_CAPSULE | ORAL | 5 refills | Status: DC

## 2021-07-23 NOTE — Progress Notes (Signed)
? ?Subjective:  ? ? Patient ID: Courtney Guzman, female    DOB: [birth year], 61 y.o.   MRN: [id number] ? ?HPI: Courtney Guzman is a 61 y.o. female who returns for follow up appointment for chronic pain and medication refill. She states her pain is located in her neck., bilateral shoulders  and occasionally reports left thigh sensation.  She rates her pain 5. Her current exercise regime is walking and performing stretching exercises. ?  ? ?Pain Inventory ?Average Pain 8 ?Pain Right Now 5 ?My pain is sharp, burning, tingling, and aching ? ?In the last 24 hours, has pain interfered with the following? ?General activity 0 ?Relation with others 0 ?Enjoyment of life 0 ?What TIME of day is your pain at its worst? evening ?Sleep (in general) Fair ? ?Pain is worse with: some activites ?Pain improves with: rest, heat/ice, therapy/exercise, and medication ?Relief from Meds: 5 ? ?Family History  ?Problem Relation Age of Onset  ? Hypertension Mother   ? Diabetes Sister   ? Cancer Maternal Grandmother   ?     OVARIAN  ? Ovarian cancer Maternal Grandmother   ? Colon cancer Neg Hx   ? Colon polyps Neg Hx   ? Esophageal cancer Neg Hx   ? Rectal cancer Neg Hx   ? Stomach cancer Neg Hx   ? ?Social History  ? ?Socioeconomic History  ? Marital status: Married  ?  Spouse name: Not on file  ? Number of children: Not on file  ? Years of education: Not on file  ? Highest education level: Not on file  ?Occupational History  ? Not on file  ?Tobacco Use  ? Smoking status: Never  ? Smokeless tobacco: Never  ?Vaping Use  ? Vaping Use: Never used  ?Substance and Sexual Activity  ? Alcohol use: No  ?  Alcohol/week: 0.0 standard drinks  ? Drug use: No  ? Sexual activity: Yes  ?  Partners: Male  ?  Birth control/protection: Surgical  ?  Comment: TUBAL LIGATION, 1st sexual encounter at 27, has had 5   ?Other Topics Concern  ? Not on file  ?Social History Narrative  ? Not on file  ? ?Social Determinants of Health  ? ?Financial Resource Strain: Not on file   ?Food Insecurity: Not on file  ?Transportation Needs: Not on file  ?Physical Activity: Not on file  ?Stress: Not on file  ?Social Connections: Not on file  ? ?Past Surgical History:  ?Procedure Laterality Date  ? [place]  ? with BTL  ? COLONOSCOPY  2017  ? Dr.Armbruster  ? COLONOSCOPY W/ POLYPECTOMY  2006  ? BENIGN ADENOMA   ? POLYPECTOMY    ? TONSILLECTOMY AND ADENOIDECTOMY    ? ?Past Surgical History:  ?Procedure Laterality Date  ? [place]  ? with BTL  ? COLONOSCOPY  2017  ? Dr.Armbruster  ? COLONOSCOPY W/ POLYPECTOMY  2006  ? BENIGN ADENOMA   ? POLYPECTOMY    ? TONSILLECTOMY AND ADENOIDECTOMY    ? ?Past Medical History:  ?Diagnosis Date  ? Allergy   ? seasonal  ? Arthritis   ? Diabetes mellitus without complication ([place])   ? Fibromyalgia   ? dr. Marveen Reeks  ? GERD (gastroesophageal reflux disease)   ? Hyperlipidemia   ? Hypertension   ? Wheezes   ? with season changes   ? ?BP 123/84   Pulse 74   Ht 5' (1.524 m)   Wt 172 lb (78 kg)  LMP 02/24/2008   SpO2 98%   BMI 33.59 kg/m?  ? ?Opioid Risk Score:   ?Fall Risk Score:  `1 ? ?Depression screen PHQ 2/9 ? ? ?  03/21/2021  ?  8:25 AM 12/25/2020  ?  1:59 PM 11/05/2020  ? 10:01 AM 01/11/2020  ?  9:00 AM 11/02/2019  ? 11:52 AM 08/04/2019  ?  9:01 AM 10/30/2018  ?  6:27 PM  ?Depression screen PHQ 2/9  ?Decreased Interest 0 0 0 0 0 1 0  ?Down, Depressed, Hopeless 0 0 0 0 0 1 0  ?PHQ - 2 Score 0 0 0 0 0 2 0  ?Altered sleeping      3   ?Tired, decreased energy      1   ?Change in appetite      0   ?Feeling bad or failure about yourself       0   ?Trouble concentrating      0   ?Moving slowly or fidgety/restless      0   ?Suicidal thoughts      0   ?PHQ-9 Score      6   ?Difficult doing work/chores      Somewhat difficult   ?  ? ?Review of Systems  ?Respiratory:  Negative for cough.   ?Musculoskeletal:  Positive for back pain.  ?All other systems reviewed and are negative. ? ?   ?Objective:  ? Physical Exam ?Vitals and nursing note reviewed.   ?Constitutional:   ?   Appearance: Normal appearance.  ?Cardiovascular:  ?   Rate and Rhythm: Normal rate and regular rhythm.  ?   Pulses: Normal pulses.  ?   Heart sounds: Normal heart sounds.  ?Pulmonary:  ?   Effort: Pulmonary effort is normal.  ?   Breath sounds: Normal breath sounds.  ?Musculoskeletal:  ?   Cervical back: Normal range of motion and neck supple.  ?   Comments: Normal Muscle Bulk and Muscle Testing Reveals:  ?Upper Extremities: Full ROM and Muscle Strength 5/5 ?Bilateral AC Joint Tenderness ?Thoracic Paraspinal Tenderness: T-1-T-3 ?Lower Extremities: Full ROM and Muscle Strength 5/5 ?Arises from Table with Ease ?Narrow Based  Gait  ?   ?Skin: ?   General: Skin is warm and dry.  ?Neurological:  ?   Mental Status: She is alert and oriented to person, place, and time.  ?Psychiatric:     ?   Mood and Affect: Mood normal.     ?   Behavior: Behavior normal.  ? ? ? ? ?   ?Assessment & Plan:  ?Cervicalgia: Continue HEP as tolerated. Continue to Monitor.  ?Cervical Myofascial Syndrome: No complaints today. . Continue to Monitor.  ?Chronic Pain of Bilateral Shoulders: Continue HEP  as tolerated. Continue to Monitor.  ?Fibromyalgia: : Continue HEP as tolerated. Continue current medication Regimen with Gabapentin. Continue to Monitor.  ? ?F/U in 6 months  ? ?

## 2021-07-27 ENCOUNTER — Encounter: Payer: Self-pay | Admitting: Registered Nurse

## 2021-07-31 ENCOUNTER — Encounter: Payer: Self-pay | Admitting: Physician Assistant

## 2021-07-31 ENCOUNTER — Ambulatory Visit: Payer: 59 | Admitting: Physician Assistant

## 2021-07-31 VITALS — BP 118/79 | HR 66 | Resp 16 | Ht 60.0 in | Wt 171.0 lb

## 2021-07-31 DIAGNOSIS — F5101 Primary insomnia: Secondary | ICD-10-CM | POA: Diagnosis not present

## 2021-07-31 DIAGNOSIS — Z5181 Encounter for therapeutic drug level monitoring: Secondary | ICD-10-CM | POA: Diagnosis not present

## 2021-07-31 DIAGNOSIS — G894 Chronic pain syndrome: Secondary | ICD-10-CM

## 2021-07-31 DIAGNOSIS — M810 Age-related osteoporosis without current pathological fracture: Secondary | ICD-10-CM

## 2021-07-31 DIAGNOSIS — E119 Type 2 diabetes mellitus without complications: Secondary | ICD-10-CM

## 2021-07-31 DIAGNOSIS — M797 Fibromyalgia: Secondary | ICD-10-CM

## 2021-07-31 DIAGNOSIS — R5383 Other fatigue: Secondary | ICD-10-CM

## 2021-07-31 DIAGNOSIS — I10 Essential (primary) hypertension: Secondary | ICD-10-CM

## 2021-07-31 DIAGNOSIS — Z9889 Other specified postprocedural states: Secondary | ICD-10-CM

## 2021-07-31 DIAGNOSIS — Z8719 Personal history of other diseases of the digestive system: Secondary | ICD-10-CM

## 2021-07-31 DIAGNOSIS — E559 Vitamin D deficiency, unspecified: Secondary | ICD-10-CM

## 2021-07-31 DIAGNOSIS — M62838 Other muscle spasm: Secondary | ICD-10-CM

## 2021-07-31 DIAGNOSIS — H40059 Ocular hypertension, unspecified eye: Secondary | ICD-10-CM

## 2021-07-31 DIAGNOSIS — Z8639 Personal history of other endocrine, nutritional and metabolic disease: Secondary | ICD-10-CM

## 2021-10-07 ENCOUNTER — Other Ambulatory Visit: Payer: Self-pay | Admitting: Family

## 2021-11-04 ENCOUNTER — Other Ambulatory Visit: Payer: Self-pay

## 2021-11-04 MED ORDER — OMEPRAZOLE 40 MG PO CPDR: 40.0000 mg | DELAYED_RELEASE_CAPSULE | Freq: Every day | ORAL | 3 refills | Status: DC

## 2021-11-04 MED ORDER — AMLODIPINE BESYLATE 10 MG PO TABS: 10.0000 mg | ORAL_TABLET | Freq: Every day | ORAL | 3 refills | Status: DC

## 2021-11-06 ENCOUNTER — Encounter: Payer: Self-pay | Admitting: Family

## 2021-11-06 ENCOUNTER — Ambulatory Visit (INDEPENDENT_AMBULATORY_CARE_PROVIDER_SITE_OTHER): Payer: 59 | Admitting: Family

## 2021-11-06 VITALS — BP 118/88 | HR 63 | Temp 98.1°F | Resp 18 | Ht 60.0 in | Wt 176.4 lb

## 2021-11-06 DIAGNOSIS — Z0000 Encounter for general adult medical examination without abnormal findings: Secondary | ICD-10-CM | POA: Diagnosis not present

## 2021-11-06 DIAGNOSIS — R7303 Prediabetes: Secondary | ICD-10-CM | POA: Diagnosis not present

## 2021-11-06 DIAGNOSIS — Z23 Encounter for immunization: Secondary | ICD-10-CM | POA: Diagnosis not present

## 2021-11-06 DIAGNOSIS — Z13220 Encounter for screening for lipoid disorders: Secondary | ICD-10-CM

## 2021-11-06 LAB — LIPID PANEL
Cholesterol: 150 mg/dL (ref 0–200)
HDL: 65.1 mg/dL (ref >39.00)
LDL Cholesterol: 73 mg/dL (ref 0–99)
NonHDL: 84.77
Total CHOL/HDL Ratio: 2
Triglycerides: 61 mg/dL (ref 0.0–149.0)
VLDL: 12.2 mg/dL (ref 0.0–40.0)

## 2021-11-06 LAB — CBC WITH DIFFERENTIAL/PLATELET
Basophils Absolute: 0 10*3/uL (ref 0.0–0.1)
Basophils Relative: 1 % (ref 0.0–3.0)
Eosinophils Absolute: 0.1 10*3/uL (ref 0.0–0.7)
Eosinophils Relative: 2.8 % (ref 0.0–5.0)
HCT: 38.3 % (ref 36.0–46.0)
Hemoglobin: 12.3 g/dL (ref 12.0–15.0)
Lymphocytes Relative: 46.4 % — ABNORMAL HIGH (ref 12.0–46.0)
Lymphs Abs: 2.3 10*3/uL (ref 0.7–4.0)
MCHC: 32.2 g/dL (ref 30.0–36.0)
MCV: 87.8 fl (ref 78.0–100.0)
Monocytes Absolute: 0.4 10*3/uL (ref 0.1–1.0)
Monocytes Relative: 8.8 % (ref 3.0–12.0)
Neutro Abs: 2.1 10*3/uL (ref 1.4–7.7)
Neutrophils Relative %: 41 % — ABNORMAL LOW (ref 43.0–77.0)
Platelets: 285 10*3/uL (ref 150.0–400.0)
RBC: 4.36 Mil/uL (ref 3.87–5.11)
RDW: 14.3 % (ref 11.5–15.5)
WBC: 5 10*3/uL (ref 4.0–10.5)

## 2021-11-06 LAB — COMPREHENSIVE METABOLIC PANEL
ALT: 10 U/L (ref 0–35)
AST: 17 U/L (ref 0–37)
Albumin: 4.4 g/dL (ref 3.5–5.2)
Alkaline Phosphatase: 73 U/L (ref 39–117)
BUN: 10 mg/dL (ref 6–23)
CO2: 30 mEq/L (ref 19–32)
Calcium: 9.4 mg/dL (ref 8.4–10.5)
Chloride: 104 mEq/L (ref 96–112)
Creatinine, Ser: 0.67 mg/dL (ref 0.40–1.20)
GFR: 94.09 mL/min (ref >60.00)
Glucose, Bld: 129 mg/dL — ABNORMAL HIGH (ref 70–99)
Potassium: 4.4 mEq/L (ref 3.5–5.1)
Sodium: 140 mEq/L (ref 135–145)
Total Bilirubin: 0.6 mg/dL (ref 0.2–1.2)
Total Protein: 6.9 g/dL (ref 6.0–8.3)

## 2021-11-06 LAB — MICROALBUMIN / CREATININE URINE RATIO
Creatinine,U: 18.7 mg/dL
Microalb Creat Ratio: 3.7 mg/g (ref 0.0–30.0)
Microalb, Ur: <0.7 mg/dL (ref 0.0–1.9)

## 2021-11-06 LAB — HEMOGLOBIN A1C: Hgb A1c MFr Bld: 6.5 % (ref 4.6–6.5)

## 2021-11-06 MED ORDER — SPIRONOLACTONE 25 MG PO TABS: 25.0000 mg | ORAL_TABLET | Freq: Every day | ORAL | 11 refills | Status: DC

## 2021-11-06 MED ORDER — ATORVASTATIN CALCIUM 10 MG PO TABS: 10.0000 mg | ORAL_TABLET | Freq: Every day | ORAL | 3 refills | Status: DC

## 2021-11-06 NOTE — Addendum Note (Signed)
Addended by: Sanda Linger on: 11/06/2021 09:13 AM   Modules accepted: Orders

## 2021-11-06 NOTE — Progress Notes (Signed)
Courtney Guzman is a 61 y.o. female with the following history as recorded in [employer]:  Patient Active Problem List   Diagnosis Date Noted   Osteoarthritis 10/30/2018   Iron deficiency anemia 10/30/2018   Essential hypertension 08/11/2016   Primary insomnia 07/01/2016   Type 2 diabetes mellitus without complication, without long-term current use of insulin ([place]) 06/14/2015   Hyperlipidemia 05/10/2014   Osteopenia 03/22/2012   GERD (gastroesophageal reflux disease)    Fibromyalgia syndrome 07/26/2008    Current Outpatient Medications  Medication Sig Dispense Refill   albuterol (VENTOLIN HFA) 108 (90 Base) MCG/ACT inhaler INHALE 2 PUFFS BY MOUTH EVERY 4 HOURS AS NEEDED FOR WHEEZE 3 g 3   amLODipine (NORVASC) 10 MG tablet Take 1 tablet (10 mg total) by mouth daily. 90 tablet 3   calcium carbonate (OS-CAL) 600 MG TABS Take 600 mg by mouth 2 (two) times daily with a meal.     COMBIGAN 0.2-0.5 % ophthalmic solution Apply 1 drop to eye 2 (two) times daily.     ferrous sulfate 325 (65 FE) MG tablet Take 325 mg by mouth daily with breakfast.     fexofenadine (ALLEGRA) 30 MG tablet Take 30 mg by mouth 2 (two) times daily.     fish oil-omega-3 fatty acids 1000 MG capsule Take 2 g by mouth daily.     gabapentin (NEURONTIN) 300 MG capsule TAKE 1 CAPSULE BY MOUTH THREE TIMES A DAY 90 capsule 5   Green Tea, Camillia sinensis, (GREEN TEA PO) Take by mouth.     hydrOXYzine (ATARAX) 25 MG tablet Take 25 mg by mouth every 8 (eight) hours.     latanoprost (XALATAN) 0.005 % ophthalmic solution Place into both eyes at bedtime.      lidocaine (LIDODERM) 5 % Place 1 patch onto the skin daily. Remove & Discard patch within 12 hours or as directed by MD 30 patch 2   Multiple Vitamin (MULTIVITAMIN) tablet Take 1 tablet by mouth daily.     multivitamin-lutein (OCUVITE-LUTEIN) CAPS capsule Take 1 capsule by mouth daily.     omeprazole (PRILOSEC) 40 MG capsule Take 1 capsule (40 mg total) by mouth daily. 90 capsule 3    Turmeric (QC TUMERIC COMPLEX PO) Take 1,000 mg by mouth daily.     atorvastatin (LIPITOR) 10 MG tablet Take 1 tablet (10 mg total) by mouth daily at 6 PM. 90 tablet 3   spironolactone (ALDACTONE) 25 MG tablet Take 1 tablet (25 mg total) by mouth daily. 30 tablet 11   zoledronic acid (RECLAST) 5 MG/100ML SOLN injection Inject 5 mg into the vein once. (Patient not taking: Reported on 11/06/2021)     No current facility-administered medications for this visit.    Allergies: Sulfa antibiotics  Past Medical History:  Diagnosis Date   Allergy    seasonal   Arthritis    Diabetes mellitus without complication ([facility])    Fibromyalgia    dr. Marveen Reeks   GERD (gastroesophageal reflux disease)    Hyperlipidemia    Hypertension    Wheezes    with season changes     Past Surgical History:  Procedure Laterality Date   CESAREAN SECTION  1993   with BTL   COLONOSCOPY  2017   Dr.Armbruster   COLONOSCOPY W/ POLYPECTOMY  2006   BENIGN ADENOMA    POLYPECTOMY     TONSILLECTOMY AND ADENOIDECTOMY      Family History  Problem Relation Age of Onset   Hypertension Mother    Diabetes Sister  Cancer Maternal Grandmother        OVARIAN   Ovarian cancer Maternal Grandmother    Colon cancer Neg Hx    Colon polyps Neg Hx    Esophageal cancer Neg Hx    Rectal cancer Neg Hx    Stomach cancer Neg Hx     Social History   Tobacco Use   Smoking status: Never   Smokeless tobacco: Never  Substance Use Topics   Alcohol use: No    Alcohol/week: 0.0 standard drinks of alcohol    Subjective:  Presents for yearly CPE; under care of pain management/ rheumatology;  Under care of ophthalmology every 4-6 months;  Monitoring blood pressure at home- averaging 130/80;  Pap smear and colonoscopy will be due in 2025;   Review of Systems  Constitutional: Negative.   HENT: Negative.    Eyes: Negative.   Respiratory: Negative.    Cardiovascular: Negative.   Gastrointestinal: Negative.   Genitourinary:  Negative.   Musculoskeletal: Negative.   Skin: Negative.   Neurological: Negative.   Endo/Heme/Allergies: Negative.   Psychiatric/Behavioral: Negative.         Objective:  Vitals:   11/06/21 0818  BP: 118/88  Pulse: 63  Resp: 18  Temp: 98.1 F (36.7 C)  TempSrc: Oral  SpO2: 98%  Weight: 176 lb 6.4 oz (80 kg)  Height: 5' (1.524 m)    General: Well developed, well nourished, in no acute distress  Skin : Warm and dry.  Head: Normocephalic and atraumatic  Eyes: Sclera and conjunctiva clear; pupils round and reactive to light; extraocular movements intact  Ears: External normal; canals clear; tympanic membranes normal  Oropharynx: Pink, supple. No suspicious lesions  Neck: Supple without thyromegaly, adenopathy  Lungs: Respirations unlabored; clear to auscultation bilaterally without wheeze, rales, rhonchi  CVS exam: normal rate and regular rhythm.  Abdomen: Soft; nontender; nondistended; normoactive bowel sounds; no masses or hepatosplenomegaly  Musculoskeletal: No deformities; no active joint inflammation  Extremities: No edema, cyanosis, clubbing  Vessels: Symmetric bilaterally  Neurologic: Alert and oriented; speech intact; face symmetrical; moves all extremities well; CNII-XII intact without focal deficit  Assessment:  1. PE (physical exam), annual   2. Lipid screening   3. Pre-diabetes     Plan:  Age appropriate preventive healthcare needs addressed; encouraged regular eye doctor and dental exams; encouraged regular exercise; will update labs and refills as needed today; follow-up to be determined;   No follow-ups on file.  Orders Placed This Encounter  Procedures   CBC with Differential/Platelet   Comp Met (CMET)   Lipid panel   Hemoglobin A1c   Urine Microalbumin w/creat. ratio    Requested Prescriptions   Signed Prescriptions Disp Refills   atorvastatin (LIPITOR) 10 MG tablet 90 tablet 3    Sig: Take 1 tablet (10 mg total) by mouth daily at 6 PM.    spironolactone (ALDACTONE) 25 MG tablet 30 tablet 11    Sig: Take 1 tablet (25 mg total) by mouth daily.

## 2021-11-17 ENCOUNTER — Other Ambulatory Visit: Payer: Self-pay | Admitting: Family

## 2021-11-17 DIAGNOSIS — J4520 Mild intermittent asthma, uncomplicated: Secondary | ICD-10-CM

## 2021-11-18 ENCOUNTER — Encounter: Payer: Self-pay | Admitting: Family

## 2021-11-18 ENCOUNTER — Other Ambulatory Visit: Payer: Self-pay | Admitting: Family

## 2021-11-18 MED ORDER — METFORMIN HCL ER 500 MG PO TB24: 500.0000 mg | ORAL_TABLET | Freq: Every day | ORAL | 1 refills | Status: DC

## 2021-12-01 ENCOUNTER — Encounter: Payer: Self-pay | Admitting: Family

## 2021-12-09 ENCOUNTER — Telehealth: Payer: Self-pay

## 2021-12-09 ENCOUNTER — Other Ambulatory Visit: Payer: Self-pay

## 2021-12-09 DIAGNOSIS — G894 Chronic pain syndrome: Secondary | ICD-10-CM

## 2021-12-09 MED ORDER — TIZANIDINE HCL 4 MG PO TABS: ORAL_TABLET | ORAL | 5 refills | Status: DC

## 2021-12-09 NOTE — Telephone Encounter (Signed)
Pharmacy request for refill of Tizanidie HCL 4 MG tablet.

## 2022-01-08 ENCOUNTER — Ambulatory Visit (INDEPENDENT_AMBULATORY_CARE_PROVIDER_SITE_OTHER): Payer: 59

## 2022-01-08 DIAGNOSIS — Z23 Encounter for immunization: Secondary | ICD-10-CM | POA: Diagnosis not present

## 2022-02-03 ENCOUNTER — Encounter: Payer: Self-pay | Admitting: Physical Medicine and Rehabilitation

## 2022-02-03 ENCOUNTER — Encounter: Payer: 59 | Attending: Physical Medicine and Rehabilitation | Admitting: Physical Medicine and Rehabilitation

## 2022-02-03 VITALS — BP 120/78 | HR 66 | Ht 60.0 in | Wt 176.0 lb

## 2022-02-03 DIAGNOSIS — E669 Obesity, unspecified: Secondary | ICD-10-CM | POA: Diagnosis present

## 2022-02-03 DIAGNOSIS — G894 Chronic pain syndrome: Secondary | ICD-10-CM | POA: Insufficient documentation

## 2022-02-03 DIAGNOSIS — M7918 Myalgia, other site: Secondary | ICD-10-CM | POA: Insufficient documentation

## 2022-02-03 DIAGNOSIS — Z6834 Body mass index (BMI) 34.0-34.9, adult: Secondary | ICD-10-CM | POA: Insufficient documentation

## 2022-02-03 MED ORDER — GABAPENTIN 300 MG PO CAPS
ORAL_CAPSULE | ORAL | 5 refills | Status: DC
Start: 2022-02-03 — End: 2023-01-28

## 2022-02-03 NOTE — Progress Notes (Signed)
Subjective:    Patient ID: Courtney Guzman, female    DOB: December 01, 1960, 61 y.o.   MRN: [id number]  HPI Courtney Guzman is a 61 year old woman who presents for follow-up of her fibromyalgia, bilateral shoulder pain, and cervical myofascial pain syndrome, and left thigh heat sensation.    1) cervical myofascial pain syndrome 2) fibromyalgia 3) bilateral shoulder pain -She has been using her heating pad, portable sauna, salonpas.  -she asks if she uses the [place] will she still need the gabapentin -she tried TENS unit and did not find this helpful. -she had to be careful with the TENS unit patches and her clothing -She had benefit from trigger point injections -Continue Gabapentin '300mg'$  TID.  -Her pain is worse with rainy weather and this limits her mobility. -She tries to walk 3 or 4 days per week, weather permitting. She walks 45 min to an hour. She has a nice safe trail near her house. Also likes to jog a little. [facility] [employer] [facility].  -she asks about Biowave. She wants to know if I have had other patients with a positive experience.    4) left anterior thigh heat sensation -today not warm -intermittent -lasts a few seconds -starting in march and April -every couple of day -not painful.   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   5) HTN:  -she would like to wean her blood pressure medications.  -she is planning to discuss this with her PCP.  120/79 today -sometimes top number is higher in the morning  6) prediabetes: -retinopathy runs in her family -she has been following with her ophtalmologist.   Pain Inventory Average Pain 6 Pain Right Now 6 My pain is intermittent, constant, burning, and tingling  In the last 24 hours, has pain interfered with the following? General activity 1 Relation with others 1 Enjoyment of life 1 What TIME of day is your pain at its worst? evening Sleep (in general) Fair  Pain  is worse with: sitting and some activites Pain improves with: rest and pacing activities Relief from Meds: 4  Family History  Problem Relation Age of Onset   Hypertension Mother    Diabetes Sister    Cancer Maternal Grandmother        OVARIAN   Ovarian cancer Maternal Grandmother    Colon cancer Neg Hx    Colon polyps Neg Hx    Esophageal cancer Neg Hx    Rectal cancer Neg Hx    Stomach cancer Neg Hx    Social History   Socioeconomic History   Marital status: Married    Spouse name: Not on file   Number of children: Not on file   Years of education: Not on file   Highest education level: Not on file  Occupational History   Not on file  Tobacco Use   Smoking status: Never   Smokeless tobacco: Never  Vaping Use   Vaping Use: Never used  Substance and Sexual Activity   Alcohol use: No    Alcohol/week: 0.0 standard drinks of alcohol   Drug use: No   Sexual activity: Yes    Partners: Male    Birth control/protection: Surgical    Comment: TUBAL LIGATION, 1st sexual encounter at 21, has had 5   Other Topics Concern   Not on file  Social History Narrative   Not on file   Social  Determinants of Health   Financial Resource Strain: Not on file  Food Insecurity: Not on file  Transportation Needs: Not on file  Physical Activity: Not on file  Stress: Not on file  Social Connections: Not on file   Past Surgical History:  Procedure Laterality Date   [place]   with BTL   COLONOSCOPY  2017   Dr.Armbruster   COLONOSCOPY W/ POLYPECTOMY  2006   BENIGN ADENOMA    POLYPECTOMY     TONSILLECTOMY AND ADENOIDECTOMY     Past Surgical History:  Procedure Laterality Date   CESAREAN SECTION  1993   with BTL   COLONOSCOPY  2017   Dr.Armbruster   COLONOSCOPY W/ POLYPECTOMY  2006   BENIGN ADENOMA    POLYPECTOMY     TONSILLECTOMY AND ADENOIDECTOMY     Past Medical History:  Diagnosis Date   Allergy    seasonal   Arthritis    Diabetes mellitus without  complication ([place])    Fibromyalgia    dr. Marveen Reeks   GERD (gastroesophageal reflux disease)    Hyperlipidemia    Hypertension    Wheezes    with season changes    BP 120/78   Pulse 66   Ht 5' (1.524 m)   Wt 176 lb (79.8 kg)   LMP 02/24/2008   SpO2 94%   BMI 34.37 kg/m   Opioid Risk Score:   Fall Risk Score:  `1  Depression screen [facility] 2/9     02/03/2022    9:19 AM 07/23/2021    9:32 AM 03/21/2021    8:25 AM 12/25/2020    1:59 PM 11/05/2020   10:01 AM 01/11/2020    9:00 AM 11/02/2019   11:52 AM  Depression screen PHQ 2/9  Decreased Interest 0 0 0 0 0 0 0  Down, Depressed, Hopeless 0 0 0 0 0 0 0  PHQ - 2 Score 0 0 0 0 0 0 0   Review of Systems  Cardiovascular:  Negative for chest pain.  Musculoskeletal:  Positive for back pain.       Pain in both hands, pain in both upper thighs  Neurological:        Tingling  All other systems reviewed and are negative.      Objective:   Physical Exam Gen: no distress, normal appearing HEENT: oral mucosa pink and moist, NCAT Cardio: Reg rate Chest: normal effort, normal rate of breathing Abd: soft, non-distended Ext: no edema Psych: pleasant, normal affect Skin: intact Neuro: Alert and oriented x3 Musculoskeletal: tight cervical paraspinals and trapezius bilaterally.  Psych: pleasant, normal affect     Assessment & Plan:  Courtney Guzman is a 61 year old woman who presents for f/u of cervical myofascial pain syndrome, fibromyalgia, and bilateral shoulder pain.   1) Chronic Pain Syndrome secondary to fibromyalgia.  -Discussed current symptoms of pain and history of pain.  -Provided with pain relief journal.  -recommended using turmeric  -discussed Biowave, discussed that it works by neuromodulation -Prescribed Zynex Nexwave, cervical traction device, and heating/cooling blanket  -discussed her desire to be on less medications.  -discussed negative response to TENS unit -restart using ginger.  -Discussed benefits of exercise  in reducing pain. -Discussed following foods that may reduce pain: 1) Ginger (especially studied for arthritis)- reduce leukotriene production to decrease inflammation 2) Blueberries- high in phytonutrients that decrease inflammation 3) Salmon- marine omega-3s reduce joint swelling and pain 4) Pumpkin seeds- reduce inflammation 5) dark chocolate- reduces inflammation  6) turmeric- reduces inflammation 7) tart cherries - reduce pain and stiffness 8) extra virgin olive oil - its compound olecanthal helps to block prostaglandins  9) chili peppers- can be eaten or applied topically via capsaicin 10) mint- helpful for headache, muscle aches, joint pain, and itching 11) garlic- reduces inflammation  Link to further information on diet for chronic pain: [email or web address]   -She does not require refill of Gabapentin- continue taking '300mg'$  TID.  -Continue resistance training exercises.  -Vitamin D level reviewed: 32 in April.   2) Cervical myofascial pain syndrome -Will schedule trigger point injections for next session.  -continue Lidoderm patch 1 patch as needed.  -discussed Biowave and my patients' weakness.  Prescribed Zynex Nexwave, cervical traction device, and heating/cooling blanket  -Provided with a pain relief journal and discussed that it contains foods and lifestyle tips to naturally help to improve pain. Discussed that these lifestyle strategies are also very good for health unlike some medications which can have negative side effects. Discussed that the act of keeping a journal can be therapeutic and helpful to realize patterns what helps to trigger and alleviate pain.    3) Overweight: -Educated that current weight is 176 lbs, BMI 34.37.  -Discussed the benefits of intermittent fasting. Recommended starting with pushing dinner 15 minutes earlier and when this feels easy, continuing to push dinner 15 minutes  earlier. Discussed that this can help her body to improve its ability to burn fat rather than glucose, improving insulin sensitivity. Recommended drinking [place] tea in the evening to help curb appetite and for its numerous health benefits.   -continue metformin -Educated regarding health benefits of weight loss- for pain, general health, chronic disease prevention, immune health, mental health.  -Will monitor weight every visit.  -Consider [place] tea daily.  -Discussed the benefits of intermittent fasting. -Discussed foods that can assist in weight loss: 1) leafy greens- high in fiber and nutrients 2) dark chocolate- improves metabolism (if prefer sweetened, best to sweeten with honey instead of sugar).  3) cruciferous vegetables- high in fiber and protein 4) full fat yogurt: high in healthy fat, protein, calcium, and probiotics 5) apples- high in a variety of phytochemicals 6) nuts- high in fiber and protein that increase feelings of fullness 7) grapefruit: rich in nutrients, antioxidants, and fiber (not to be taken with anticoagulation) 8) beans- high in protein and fiber 9) salmon- has high quality protein and healthy fats 10) green tea- rich in polyphenols 11) eggs- rich in choline and vitamin D 12) tuna- high protein, boosts metabolism 13) avocado- decreases visceral abdominal fat 14) chicken (pasture raised): high in protein and iron 15) blueberries- reduce abdominal fat and cholesterol 16) whole grains- decreases calories retained during digestion, speeds metabolism 17) chia seeds- curb appetite 18) chilies- increases fat metabolism  -continue green tea supplement.  -Discussed supplements that can be used:  1) Metatrim '400mg'$  BID 30 minutes before breakfast and dinner  2) Sphaeranthus indicus and Garcinia mangostana (combinations of these and #1 can be found in capsicum and zychrome  3) green coffee bean extract '400mg'$  twice per day or Irvingia (african mango) 150 to '300mg'$  twice  per day.  4) HTN: -On Norvasc '10mg'$  daily and Lisinopril- recommended weaning Lisinopril to '10mg'$  first next visit with her PCP- she will discuss with her.  -Advised checking BP daily at home and logging results to bring into follow-up appointment with her PCP and myself. -Reviewed BP meds today.  -Advised regarding healthy foods that can help lower blood  pressure and provided with a list: 1) citrus foods- high in vitamins and minerals 2) salmon and other fatty fish - reduces inflammation and oxylipins 3) swiss chard (leafy green)- high level of nitrates 4) pumpkin seeds- one of the best natural sources of magnesium 5) Beans and lentils- high in fiber, magnesium, and potassium 6) Berries- high in flavonoids 7) Amaranth (whole grain, can be cooked similarly to rice and oats)- high in magnesium and fiber 8) Pistachios- even more effective at reducing BP than other nuts 9) Carrots- high in phenolic compounds that relax blood vessels and reduce inflammation 10) Celery- contain phthalides that relax tissues of arterial walls 11) Tomatoes- can also improve cholesterol and reduce risk of heart disease 12) Broccoli- good source of magnesium, calcium, and potassium 13) Greek yogurt: high in potassium and calcium 14) Herbs and spices: Celery seed, cilantro, saffron, lemongrass, black cumin, ginseng, cinnamon, cardamom, sweet basil, and ginger 15) Chia and flax seeds- also help to lower cholesterol and blood sugar 16) Beets- high levels of nitrates that relax blood vessels  17) spinach and bananas- high in potassium  -Provided lise of supplements that can help with hypertension:  1) magnesium: one high quality brand is Bioptemizers since it contains all 7 types of magnesium, otherwise over the counter magnesium gluconate '400mg'$  is a good option 2) B vitamins 3) vitamin D 4) potassium 5) CoQ10 6) L-arginine 7) Vitamin C 8) Beetroot -Educated that goal BP is 120/80. -Made goal to incorporate some  of the above foods into diet.    5) Insomnia: -Try to go outside near sunrise -Get exercise during the day.  -Discussed good sleep hygiene: turning off all devices an hour before bedtime.  -Chamomile tea with dinner.  -Can consider over the counter melatonin  6) HLD:  -Lipid panel reviewed and is within normal limits.  -Discussed with her if she wants to go off her statin. Discussed weaning to every other day.   7) Left lateral femoral cutaneous neuropathy -educated that this benign.   8) Low vitamin D: discussed that her Vitamin D level is 32 which is at the low end of normal. I recommend 50,000U ergocalciferol once per week for 7 weeks.   9) Prediabetes -HgbA1c is 6.3  -check CBGs daily, log, and bring log to follow-up appointment -avoid sugar, bread, pasta, rice -avoid snacking -try to incorporate into your diet some of the following foods which are good for diabetes: 1) cinnamon- imitates effects of insulin, increasing glucose transport into cells ([place] or [place] cinnamon is best, least processed) 2) nuts- can slow down the blood sugar response of carbohydrate rich foods 3) oatmeal- contains and anti-inflammatory compound avenanthramide 4) whole-milk yogurt (best types are no sugar, [place] yogurt, or goat/sheep yogurt) 5) beans- high in protein, fiber, and vitamins, low glycemic index 6) broccoli- great source of vitamin A and C 7) quinoa- higher in protein and fiber than other grains 8) spinach- high in vitamin A, fiber, and protein 9) olive oil- reduces glucose levels, LDL, and triglycerides 10) salmon- excellent amount of omega-3-fatty acids 11) walnuts- rich in antioxidants 12) apples- high in fiber and quercetin 13) carrots- highly nutritious with low impact on blood sugar 14) eggs- improve HDL (good cholesterol), high in protein, keep you satiated 15) turmeric: improves blood sugars, cardiovascular disease, and protects kidney health 16) garlic: improves blood  sugar, blood pressure, pain 17) tomatoes: highly nutritious with low impact on blood sugar

## 2022-02-03 NOTE — Addendum Note (Signed)
Addended by: Izora Ribas on: 02/03/2022 10:04 AM   Modules accepted: Orders

## 2022-05-11 ENCOUNTER — Other Ambulatory Visit: Payer: Self-pay | Admitting: Family

## 2022-05-13 ENCOUNTER — Other Ambulatory Visit: Payer: Self-pay | Admitting: Family

## 2022-05-13 DIAGNOSIS — Z1231 Encounter for screening mammogram for malignant neoplasm of breast: Secondary | ICD-10-CM

## 2022-05-28 NOTE — Progress Notes (Signed)
Office Visit Note  Patient: Courtney Guzman             Date of Birth: 09-11-1960           MRN: [id number]             PCP: Marrian Salvage, FNP Referring: Marrian Salvage,* Visit Date: 06/03/2022 Occupation: @GUAROCC$ @  Subjective:  Chief complaint: right thumb pain   History of Present Illness: Courtney Guzman is a 62 y.o. female presenting with pain and limited range of motion of the right first digit. She first noticed the issue two weeks ago when she [facility] [employer] [employer]. She does not recall any direct trauma to the finger. She states the pain starts at the first MCP and radiates through the digit. There is no pain throughout the rest of the hand. She has difficulty flexing the finger and describes that the finger gets stuck when she does flex it. She has tried a brace and tylenol to help with the pain. She has no other concerns today.    Activities of Daily Living:  Patient reports morning stiffness for 5 minutes.   Patient Denies nocturnal pain.  Difficulty dressing/grooming: Denies Difficulty climbing stairs: Denies Difficulty getting out of chair: Denies Difficulty using hands for taps, buttons, cutlery, and/or writing: Reports  Review of Systems  Constitutional:  Negative for fatigue.  HENT:  Negative for mouth sores and mouth dryness.   Eyes:  Negative for dryness.  Respiratory:  Negative for shortness of breath.   Cardiovascular:  Negative for chest pain and palpitations.  Gastrointestinal:  Negative for blood in stool, constipation and diarrhea.  Endocrine: Negative for increased urination.  Genitourinary:  Negative for involuntary urination.  Musculoskeletal:  Positive for joint pain, joint pain, morning stiffness and muscle tenderness. Negative for gait problem, joint swelling, myalgias, muscle weakness and myalgias.  Skin:  Negative for color change, rash, hair loss and sensitivity to sunlight.  Allergic/Immunologic: Negative for  susceptible to infections.  Neurological:  Negative for dizziness and headaches.  Hematological:  Negative for swollen glands.  Psychiatric/Behavioral:  Negative for depressed mood and sleep disturbance. The patient is not nervous/anxious.     PMFS History:  Patient Active Problem List   Diagnosis Date Noted   Osteoarthritis 10/30/2018   Iron deficiency anemia 10/30/2018   Essential hypertension 08/11/2016   Primary insomnia 07/01/2016   Type 2 diabetes mellitus without complication, without long-term current use of insulin ([place]) 06/14/2015   Hyperlipidemia 05/10/2014   Osteopenia 03/22/2012   GERD (gastroesophageal reflux disease)    Fibromyalgia syndrome 07/26/2008    Past Medical History:  Diagnosis Date   Allergy    seasonal   Arthritis    Diabetes mellitus without complication (HCC)    Fibromyalgia    dr. Marveen Reeks   GERD (gastroesophageal reflux disease)    Hyperlipidemia    Hypertension    Wheezes    with season changes     Family History  Problem Relation Age of Onset   Hypertension Mother    Diabetes Sister    Cancer Maternal Grandmother        OVARIAN   Ovarian cancer Maternal Grandmother    Colon cancer Neg Hx    Colon polyps Neg Hx    Esophageal cancer Neg Hx    Rectal cancer Neg Hx    Stomach cancer Neg Hx    Past Surgical History:  Procedure Laterality Date   [place]  with BTL   COLONOSCOPY  2017   Dr.Armbruster   COLONOSCOPY W/ POLYPECTOMY  2006   BENIGN ADENOMA    POLYPECTOMY     TONSILLECTOMY AND ADENOIDECTOMY     Social History   Social History Narrative   Not on file   Immunization History  Administered Date(s) Administered   Influenza,inj,Quad PF,6+ Mos 01/18/2014, 02/19/2016   Influenza-Unspecified 01/27/2017   PFIZER(Purple Top)SARS-COV-2 Vaccination 07/21/2019, 08/22/2019, 07/17/2021   PPD Test 03/17/2011   Tdap 04/11/2013, 03/08/2016   Zoster Recombinat (Shingrix) 11/06/2021, 01/08/2022      Objective: Vital Signs: BP 122/79 (BP Location: Left Arm, Patient Position: Sitting, Cuff Size: Normal)   Pulse 72   Resp 16   Ht 5' (1.524 m)   Wt 170 lb (77.1 kg)   LMP 02/24/2008   BMI 33.20 kg/m    Physical Exam Vitals and nursing note reviewed.  Constitutional:      Appearance: She is well-developed.  HENT:     Head: Normocephalic and atraumatic.  Eyes:     Conjunctiva/sclera: Conjunctivae normal.  Cardiovascular:     Rate and Rhythm: Normal rate and regular rhythm.     Heart sounds: Normal heart sounds.  Pulmonary:     Effort: Pulmonary effort is normal.     Breath sounds: Normal breath sounds.  Abdominal:     General: Bowel sounds are normal.     Palpations: Abdomen is soft.  Musculoskeletal:     Cervical back: Normal range of motion.  Lymphadenopathy:     Cervical: No cervical adenopathy.  Skin:    General: Skin is warm and dry.     Capillary Refill: Capillary refill takes less than 2 seconds.  Neurological:     Mental Status: She is alert and oriented to person, place, and time.  Psychiatric:        Behavior: Behavior normal.     Musculoskeletal Exam: Cervical spine was in good range of motion.  Shoulders, elbows, wrist joints, MCPs PIPs and DIPs with good range of motion.  Flexor tendon thickening of the right thumb was noted.  Triggering of the right thumb was noted.  Hip joints and knee joints in good range of motion.  She had no tenderness over ankles or MTPs.  She had generalized hyperalgesia and tender points.  CDAI Exam: CDAI Score: -- Patient Global: --; Provider Global: -- Swollen: --; Tender: -- Joint Exam 06/03/2022   No joint exam has been documented for this visit   There is currently no information documented on the homunculus. Go to the Rheumatology activity and complete the homunculus joint exam.  Investigation: No additional findings.  Imaging: No results found.  Recent Labs: Lab Results  Component Value Date   WBC 5.0  11/06/2021   HGB 12.3 11/06/2021   PLT 285.0 11/06/2021   NA 140 11/06/2021   K 4.4 11/06/2021   CL 104 11/06/2021   CO2 30 11/06/2021   GLUCOSE 129 (H) 11/06/2021   BUN 10 11/06/2021   CREATININE 0.67 11/06/2021   BILITOT 0.6 11/06/2021   ALKPHOS 73 11/06/2021   AST 17 11/06/2021   ALT 10 11/06/2021   PROT 6.9 11/06/2021   ALBUMIN 4.4 11/06/2021   CALCIUM 9.4 11/06/2021   GFRAA 112 08/08/2020   July 07, 2021 DEXA scan T-score -1.6, BMD 0.866 AP spine   Speciality Comments: No specialty comments available.  Procedures:  No procedures performed Allergies: Sulfa antibiotics   Assessment / Plan:     Visit Diagnoses: Pain of  right thumb. History and physical exam are consistent with trigger thumb.  Detailed counsel regarding trigger thumb was provided.  The symptoms most likely exacerbated by repeated motion and lifting weights at the gym.  Patient has been wearing a brace but it keeps her thumb mobile.  Immobilization of the thumb by splint was advised.  Patient states she has a thumb splint at home which she can use.  I also discussed possible option of trigger point injection.  Patient is hesitant as she has increased intraocular pressure.  She states if her symptoms do not improve then she will discuss with her ophthalmologist if she can have her trigger finger injection.  I also discussed the option of surgery if she has persistent symptoms.  Other medical problems listed as follows:  Vitamin D deficiency-she takes over-the-counter vitamin D.  Age-related osteoporosis without current pathological fracture - DEXA 06/29/2019: AP spine 0.856 with T-score: -2.7.  Previous DEXA on 07/31/13: AP spine BMD 0.839 with T-score -1.9. IV Reclast infusion 08/10/2019-flu like symp. On reclast.  DEXA scan on July 07, 2021 showed a T-score of -1.6 and BMD 0.866 in the AP spine.  She will need repeat DEXA scan in 2025.  Last Reclast infusion was May 2022.  She is on a drug holiday  currently.  Fibromyalgia syndrome -she is going to pain management for fibromyalgia syndrome.  She is on Zanaflex and gabapentin.  Primary insomnia-good sleep hygiene was discussed.  Other fatigue-related to fibromyalgia and insomnia.  Chronic pain syndrome-followed by pain management.  Other medical problems are listed as follows:  Trapezius muscle spasm  Type 2 diabetes mellitus without complication, without long-term current use of insulin (HCC)  Essential hypertension  History of hyperlipidemia  Raised intraocular pressure, unspecified laterality  History of gastroesophageal reflux (GERD)  Orders: Orders Placed This Encounter  Procedures   US Guided Needle Placement   No orders of the defined types were placed in this encounter.    Follow-Up Instructions: Return if symptoms worsen or fail to improve, for Trigger thumb.   Bo Merino, MD  Note - This record has been created using Editor, commissioning.  Chart creation errors have been sought, but may not always  have been located. Such creation errors do not reflect on  the standard of medical care.

## 2022-06-03 ENCOUNTER — Ambulatory Visit: Payer: 59

## 2022-06-03 ENCOUNTER — Ambulatory Visit: Payer: 59 | Attending: Rheumatology | Admitting: Rheumatology

## 2022-06-03 ENCOUNTER — Encounter: Payer: Self-pay | Admitting: Rheumatology

## 2022-06-03 VITALS — BP 122/79 | HR 72 | Resp 16 | Ht 60.0 in | Wt 170.0 lb

## 2022-06-03 DIAGNOSIS — H40059 Ocular hypertension, unspecified eye: Secondary | ICD-10-CM

## 2022-06-03 DIAGNOSIS — M810 Age-related osteoporosis without current pathological fracture: Secondary | ICD-10-CM

## 2022-06-03 DIAGNOSIS — R5383 Other fatigue: Secondary | ICD-10-CM

## 2022-06-03 DIAGNOSIS — Z8719 Personal history of other diseases of the digestive system: Secondary | ICD-10-CM

## 2022-06-03 DIAGNOSIS — M79644 Pain in right finger(s): Secondary | ICD-10-CM

## 2022-06-03 DIAGNOSIS — E559 Vitamin D deficiency, unspecified: Secondary | ICD-10-CM

## 2022-06-03 DIAGNOSIS — G894 Chronic pain syndrome: Secondary | ICD-10-CM

## 2022-06-03 DIAGNOSIS — M797 Fibromyalgia: Secondary | ICD-10-CM

## 2022-06-03 DIAGNOSIS — Z5181 Encounter for therapeutic drug level monitoring: Secondary | ICD-10-CM | POA: Diagnosis not present

## 2022-06-03 DIAGNOSIS — M62838 Other muscle spasm: Secondary | ICD-10-CM

## 2022-06-03 DIAGNOSIS — E119 Type 2 diabetes mellitus without complications: Secondary | ICD-10-CM

## 2022-06-03 DIAGNOSIS — Z8639 Personal history of other endocrine, nutritional and metabolic disease: Secondary | ICD-10-CM

## 2022-06-03 DIAGNOSIS — F5101 Primary insomnia: Secondary | ICD-10-CM

## 2022-06-03 DIAGNOSIS — I10 Essential (primary) hypertension: Secondary | ICD-10-CM

## 2022-06-10 ENCOUNTER — Ambulatory Visit: Payer: 59

## 2022-06-10 ENCOUNTER — Ambulatory Visit: Payer: 59 | Attending: Rheumatology | Admitting: Rheumatology

## 2022-06-10 DIAGNOSIS — M65311 Trigger thumb, right thumb: Secondary | ICD-10-CM | POA: Diagnosis not present

## 2022-06-10 MED ORDER — TRIAMCINOLONE ACETONIDE 40 MG/ML IJ SUSP
10.0000 mg | INTRAMUSCULAR | Status: AC | PRN
  Administered 2022-06-10: 10 mg

## 2022-06-10 MED ORDER — LIDOCAINE HCL 1 % IJ SOLN
0.5000 mL | INTRAMUSCULAR | Status: AC | PRN
  Administered 2022-06-10: .5 mL

## 2022-06-10 NOTE — Progress Notes (Signed)
   Procedure Note  Patient: Courtney Guzman             Date of Birth: [birth year]           MRN: [id number]             Visit Date: 06/10/2022  Procedures: Visit Diagnoses:  1. Trigger finger of right thumb   Ultrasound guided injection is preferred based studies that show increased duration, increased effect, greater accuracy, decreased procedural pain, increased response rate, and decreased cost with ultrasound guided versus blind injection.   Verbal informed consent obtained.  Time-out conducted.  Noted no overlying erythema, induration, or other signs of local infection. Ultrasound-guided trigger finger injection: After sterile prep with Betadine, injected 0.5 mL of 1% lidocaine and 10 mg Kenalog using a 27-gauge needle, in the flexor tendon sheath.    Hand/UE Inj: R thumb A1 for trigger finger on 06/10/2022 3:49 PM Indications: pain, tendon swelling and therapeutic Details: 27 G needle, ultrasound-guided volar approach Medications: 0.5 mL lidocaine 1 %; 10 mg triamcinolone acetonide 40 MG/ML Aspirate: 0 mL Procedure, treatment alternatives, risks and benefits explained, specific risks discussed. Immediately prior to procedure a time out was called to verify the correct patient, procedure, equipment, support staff and site/side marked as required. Patient was prepped and draped in the usual sterile fashion.     Procedure instructions were given.  A splint was applied. Bo Merino, MD

## 2022-06-20 ENCOUNTER — Other Ambulatory Visit: Payer: Self-pay | Admitting: Physical Medicine and Rehabilitation

## 2022-06-20 DIAGNOSIS — G894 Chronic pain syndrome: Secondary | ICD-10-CM

## 2022-07-02 ENCOUNTER — Ambulatory Visit
Admission: RE | Admit: 2022-07-02 | Discharge: 2022-07-02 | Disposition: A | Discharge status: Home / Self Care | Payer: 59 | Source: Ambulatory Visit | Attending: Family | Admitting: Family

## 2022-07-02 DIAGNOSIS — Z1231 Encounter for screening mammogram for malignant neoplasm of breast: Secondary | ICD-10-CM

## 2022-07-28 ENCOUNTER — Encounter: Payer: 59 | Attending: Physical Medicine and Rehabilitation | Admitting: Physical Medicine and Rehabilitation

## 2022-07-28 VITALS — BP 124/81 | HR 71 | Ht 60.0 in | Wt 168.0 lb

## 2022-07-28 DIAGNOSIS — R200 Anesthesia of skin: Secondary | ICD-10-CM

## 2022-07-28 DIAGNOSIS — R202 Paresthesia of skin: Secondary | ICD-10-CM | POA: Diagnosis present

## 2022-07-28 DIAGNOSIS — M797 Fibromyalgia: Secondary | ICD-10-CM

## 2022-07-28 NOTE — Progress Notes (Signed)
Subjective:    Patient ID: Courtney Guzman, female    DOB: 07/08/1960, 62 y.o.   MRN: [id number]  HPI 1) Fibromyalgia: -symptoms stable -still using the gabapentin -hands are numb and tingly -has done PT in the past and it did not help much -she tried dry needling  2) Numbness and tingling in hands: -not worse in the morning Sometimes present longer than others -sometimes she does not feel it during the midday -she has carpal tunnel release of both hands  Pain Inventory Average Pain 8 Pain Right Now 6 My pain is sharp, burning, tingling, and aching  In the last 24 hours, has pain interfered with the following? General activity 0 Relation with others 0 Enjoyment of life 0 What TIME of day is your pain at its worst? evening and night Sleep (in general) Fair  Pain is worse with: inactivity and long day Pain improves with: rest and therapy/exercise Relief from Meds: 0  Family History  Problem Relation Age of Onset   Hypertension Mother    Diabetes Sister    Cancer Maternal Grandmother        OVARIAN   Ovarian cancer Maternal Grandmother    Colon cancer Neg Hx    Colon polyps Neg Hx    Esophageal cancer Neg Hx    Rectal cancer Neg Hx    Stomach cancer Neg Hx    Social History   Socioeconomic History   Marital status: Married    Spouse name: Not on file   Number of children: Not on file   Years of education: Not on file   Highest education level: Not on file  Occupational History   Not on file  Tobacco Use   Smoking status: Never    Passive exposure: Never   Smokeless tobacco: Never  Vaping Use   Vaping Use: Never used  Substance and Sexual Activity   Alcohol use: No    Alcohol/week: 0.0 standard drinks of alcohol   Drug use: No   Sexual activity: Yes    Partners: Male    Birth control/protection: Surgical    Comment: TUBAL LIGATION, 1st sexual encounter at 43, has had 5   Other Topics Concern   Not on file  Social History Narrative   Not on file    Social Determinants of Health   Financial Resource Strain: Not on file  Food Insecurity: Not on file  Transportation Needs: Not on file  Physical Activity: Not on file  Stress: Not on file  Social Connections: Not on file   Past Surgical History:  Procedure Laterality Date   CESAREAN SECTION  1993   with BTL   COLONOSCOPY  2017   Dr.Armbruster   COLONOSCOPY W/ POLYPECTOMY  2006   BENIGN ADENOMA    POLYPECTOMY     TONSILLECTOMY AND ADENOIDECTOMY     Past Surgical History:  Procedure Laterality Date   CESAREAN SECTION  1993   with BTL   COLONOSCOPY  2017   Dr.Armbruster   COLONOSCOPY W/ POLYPECTOMY  2006   BENIGN ADENOMA    POLYPECTOMY     TONSILLECTOMY AND ADENOIDECTOMY     Past Medical History:  Diagnosis Date   Allergy    seasonal   Arthritis    Diabetes mellitus without complication (HCC)    Fibromyalgia    dr. Jimmy Footman   GERD (gastroesophageal reflux disease)    Hyperlipidemia    Hypertension    Wheezes    with season changes    BP  124/81   Pulse 71   Ht 5' (1.524 m)   Wt 168 lb (76.2 kg)   LMP 02/24/2008   SpO2 94%   BMI 32.81 kg/m   Opioid Risk Score:   Fall Risk Score:  `1  Depression screen [facility] 2/9     02/03/2022    9:19 AM 07/23/2021    9:32 AM 03/21/2021    8:25 AM 12/25/2020    1:59 PM 11/05/2020   10:01 AM 01/11/2020    9:00 AM 11/02/2019   11:52 AM  Depression screen PHQ 2/9  Decreased Interest 0 0 0 0 0 0 0  Down, Depressed, Hopeless 0 0 0 0 0 0 0  PHQ - 2 Score 0 0 0 0 0 0 0    Review of Systems  Musculoskeletal:  Positive for back pain.       Bilateral arm pain  All other systems reviewed and are negative.     Objective:   Physical Exam Gen: no distress, normal appearing HEENT: oral mucosa pink and moist, NCAT Cardio: Reg rate Chest: normal effort, normal rate of breathing Abd: soft, non-distended Ext: no edema Psych: pleasant, normal affect Skin: intact Neuro: Alert and oriented x3       Assessment & Plan:  1)  Chronic Pain Syndrome secondary to fibromyalgia -continue gabapentin -Discussed current symptoms of pain and history of pain.  -Discussed benefits of exercise in reducing pain. -Discussed following foods that may reduce pain: 1) Ginger (especially studied for arthritis)- reduce leukotriene production to decrease inflammation 2) Blueberries- high in phytonutrients that decrease inflammation 3) Salmon- marine omega-3s reduce joint swelling and pain 4) Pumpkin seeds- reduce inflammation 5) dark chocolate- reduces inflammation 6) turmeric- reduces inflammation 7) tart cherries - reduce pain and stiffness 8) extra virgin olive oil - its compound olecanthal helps to block prostaglandins  9) chili peppers- can be eaten or applied topically via capsaicin 10) mint- helpful for headache, muscle aches, joint pain, and itching 11) garlic- reduces inflammation  Link to further information on diet for chronic pain: [email or web address]   2) Numbness and tingling: -discussed current symptoms -discussed PT -continue resistance bands -Prescribed Zynex Nexwave, cervical traction device, and heating/cooling blanket

## 2022-08-10 ENCOUNTER — Encounter: Payer: 59 | Admitting: Physical Medicine and Rehabilitation

## 2022-09-02 ENCOUNTER — Telehealth: Payer: Self-pay | Admitting: Physical Medicine and Rehabilitation

## 2022-09-02 NOTE — Telephone Encounter (Signed)
Patient following up on request for a cervical contraction device and tens unit through [place] [facility]. Please send referral for both devices.   Please follow up with patient

## 2022-09-04 NOTE — Telephone Encounter (Signed)
Patient notified

## 2022-09-30 ENCOUNTER — Encounter: Payer: Self-pay | Admitting: Physical Medicine and Rehabilitation

## 2022-10-02 ENCOUNTER — Telehealth: Payer: Self-pay

## 2022-10-02 NOTE — Telephone Encounter (Signed)
Patient called statin that Dr. Carlis Abbott is suppose to be sending in a script for 2 devices.

## 2022-10-02 NOTE — Telephone Encounter (Signed)
MyChart sent to Dr. Carlis Abbott about this matter.

## 2022-10-04 ENCOUNTER — Other Ambulatory Visit: Payer: Self-pay | Admitting: Family

## 2022-10-04 ENCOUNTER — Other Ambulatory Visit: Payer: Self-pay | Admitting: Family Medicine

## 2022-10-08 NOTE — Telephone Encounter (Signed)
ATC patient about issue: there is a rx for zynex scanned into patient's chart. Unable to detect via mychart what patient is asking office for. Tried to call patient on several occasions w/o success.

## 2022-10-12 ENCOUNTER — Telehealth: Payer: Self-pay | Admitting: *Deleted

## 2022-10-12 NOTE — Telephone Encounter (Signed)
Several orders have been written for patient for cervical traction device/E-Stim. [employer] does not contract with [employer] therefore this would need to be ordered per patient thru [facility]  We don't have any info on [facility] and patient is insisting that PA be initiated. I called [employer] they needed CPT codes so 97014 was used for E-stim and [phone] for Cervical traction. PA was not required for those CPT codes if they are the correct device.  I'm not sure how you will go about getting these items from [facility]. Call Ref # [id number] [phone]

## 2022-10-16 NOTE — Telephone Encounter (Signed)
Rx demographic and last ov note faxed to [facility] [phone].

## 2022-11-10 ENCOUNTER — Encounter: Payer: 59 | Admitting: Family

## 2022-11-16 ENCOUNTER — Other Ambulatory Visit: Payer: Self-pay | Admitting: Family

## 2022-11-18 ENCOUNTER — Encounter (INDEPENDENT_AMBULATORY_CARE_PROVIDER_SITE_OTHER): Payer: Self-pay

## 2022-11-19 ENCOUNTER — Encounter: Payer: 59 | Admitting: Family

## 2022-11-20 ENCOUNTER — Other Ambulatory Visit: Payer: Self-pay | Admitting: Family Medicine

## 2022-12-03 ENCOUNTER — Encounter: Payer: Self-pay | Admitting: Family

## 2022-12-03 ENCOUNTER — Ambulatory Visit (INDEPENDENT_AMBULATORY_CARE_PROVIDER_SITE_OTHER): Payer: 59 | Admitting: Family

## 2022-12-03 VITALS — BP 118/78 | HR 62 | Temp 98.9°F | Resp 18 | Ht 60.0 in | Wt 171.0 lb

## 2022-12-03 DIAGNOSIS — Z13220 Encounter for screening for lipoid disorders: Secondary | ICD-10-CM

## 2022-12-03 DIAGNOSIS — M79641 Pain in right hand: Secondary | ICD-10-CM | POA: Diagnosis not present

## 2022-12-03 DIAGNOSIS — Z0000 Encounter for general adult medical examination without abnormal findings: Secondary | ICD-10-CM | POA: Diagnosis not present

## 2022-12-03 DIAGNOSIS — R7309 Other abnormal glucose: Secondary | ICD-10-CM | POA: Diagnosis not present

## 2022-12-03 LAB — COMPREHENSIVE METABOLIC PANEL
ALT: 11 U/L (ref 0–35)
AST: 18 U/L (ref 0–37)
Albumin: 4.6 g/dL (ref 3.5–5.2)
Alkaline Phosphatase: 71 U/L (ref 39–117)
BUN: 12 mg/dL (ref 6–23)
CO2: 29 mEq/L (ref 19–32)
Calcium: 10.1 mg/dL (ref 8.4–10.5)
Chloride: 103 mEq/L (ref 96–112)
Creatinine, Ser: 0.71 mg/dL (ref 0.40–1.20)
GFR: 90.84 mL/min (ref >60.00)
Glucose, Bld: 116 mg/dL — ABNORMAL HIGH (ref 70–99)
Potassium: 4.6 mEq/L (ref 3.5–5.1)
Sodium: 138 mEq/L (ref 135–145)
Total Bilirubin: 0.7 mg/dL (ref 0.2–1.2)
Total Protein: 7.2 g/dL (ref 6.0–8.3)

## 2022-12-03 LAB — CBC WITH DIFFERENTIAL/PLATELET
Basophils Absolute: 0 10*3/uL (ref 0.0–0.1)
Basophils Relative: 0.7 % (ref 0.0–3.0)
Eosinophils Absolute: 0.1 10*3/uL (ref 0.0–0.7)
Eosinophils Relative: 2.3 % (ref 0.0–5.0)
HCT: 39.1 % (ref 36.0–46.0)
Hemoglobin: 12.4 g/dL (ref 12.0–15.0)
Lymphocytes Relative: 42.9 % (ref 12.0–46.0)
Lymphs Abs: 2.3 10*3/uL (ref 0.7–4.0)
MCHC: 31.7 g/dL (ref 30.0–36.0)
MCV: 89.9 fl (ref 78.0–100.0)
Monocytes Absolute: 0.4 10*3/uL (ref 0.1–1.0)
Monocytes Relative: 7.4 % (ref 3.0–12.0)
Neutro Abs: 2.5 10*3/uL (ref 1.4–7.7)
Neutrophils Relative %: 46.7 % (ref 43.0–77.0)
Platelets: 346 10*3/uL (ref 150.0–400.0)
RBC: 4.35 Mil/uL (ref 3.87–5.11)
RDW: 13.8 % (ref 11.5–15.5)
WBC: 5.4 10*3/uL (ref 4.0–10.5)

## 2022-12-03 LAB — LIPID PANEL
Cholesterol: 180 mg/dL (ref 0–200)
HDL: 65.5 mg/dL (ref >39.00)
LDL Cholesterol: 101 mg/dL — ABNORMAL HIGH (ref 0–99)
NonHDL: 114.78
Total CHOL/HDL Ratio: 3
Triglycerides: 69 mg/dL (ref 0.0–149.0)
VLDL: 13.8 mg/dL (ref 0.0–40.0)

## 2022-12-03 LAB — HEMOGLOBIN A1C: Hgb A1c MFr Bld: 6.5 % (ref 4.6–6.5)

## 2022-12-03 LAB — TSH: TSH: 1.4 u[IU]/mL (ref 0.35–5.50)

## 2022-12-03 MED ORDER — OMEPRAZOLE 40 MG PO CPDR: 40.0000 mg | DELAYED_RELEASE_CAPSULE | Freq: Every day | ORAL | 3 refills | Status: DC

## 2022-12-03 MED ORDER — AMLODIPINE BESYLATE 10 MG PO TABS: 10.0000 mg | ORAL_TABLET | Freq: Every day | ORAL | 3 refills | Status: DC

## 2022-12-03 MED ORDER — ATORVASTATIN CALCIUM 10 MG PO TABS: 10.0000 mg | ORAL_TABLET | Freq: Every day | ORAL | 3 refills | Status: DC

## 2022-12-03 MED ORDER — SPIRONOLACTONE 25 MG PO TABS: 25.0000 mg | ORAL_TABLET | Freq: Every day | ORAL | 3 refills | Status: DC

## 2022-12-03 NOTE — Progress Notes (Signed)
Courtney Guzman is a 62 y.o. female with the following history as recorded in EpicCare:  Patient Active Problem List   Diagnosis Date Noted   Osteoarthritis 10/30/2018   Iron deficiency anemia 10/30/2018   Essential hypertension 08/11/2016   Primary insomnia 07/01/2016   Type 2 diabetes mellitus without complication, without long-term current use of insulin (HCC) 06/14/2015   Hyperlipidemia 05/10/2014   Osteopenia 03/22/2012   GERD (gastroesophageal reflux disease)    Fibromyalgia syndrome 07/26/2008    Current Outpatient Medications  Medication Sig Dispense Refill   albuterol (VENTOLIN HFA) 108 (90 Base) MCG/ACT inhaler INHALE 2 PUFFS BY MOUTH EVERY 4 HOURS AS NEEDED FOR WHEEZING 8.5 each 3   calcium carbonate (OS-CAL) 600 MG TABS Take 600 mg by mouth 2 (two) times daily with a meal.     COMBIGAN 0.2-0.5 % ophthalmic solution Apply 1 drop to eye 2 (two) times daily.     ferrous sulfate 325 (65 FE) MG tablet Take 325 mg by mouth daily with breakfast.     gabapentin (NEURONTIN) 300 MG capsule TAKE 1 CAPSULE BY MOUTH THREE TIMES A DAY 90 capsule 5   Green Tea, Camillia sinensis, (GREEN TEA PO) Take by mouth.     latanoprost (XALATAN) 0.005 % ophthalmic solution Place into both eyes at bedtime.      lidocaine (LIDODERM) 5 % Place 1 patch onto the skin daily. Remove & Discard patch within 12 hours or as directed by MD 30 patch 2   multivitamin-lutein (OCUVITE-LUTEIN) CAPS capsule Take 1 capsule by mouth daily.     Omega-3 Fatty Acids (FISH OIL) 300 MG CAPS Fish Oil     tiZANidine (ZANAFLEX) 4 MG tablet TAKE 1 TABLET BY MOUTH AT BEDTIME AS NEEDED FOR MUSCLE SPASM 30 tablet 5   Turmeric (QC TUMERIC COMPLEX PO) Take 1,000 mg by mouth daily.     amLODipine (NORVASC) 10 MG tablet Take 1 tablet (10 mg total) by mouth daily. 90 tablet 3   atorvastatin (LIPITOR) 10 MG tablet Take 1 tablet (10 mg total) by mouth daily at 6 PM. 90 tablet 3   omeprazole (PRILOSEC) 40 MG capsule Take 1 capsule (40 mg  total) by mouth daily. 90 capsule 3   spironolactone (ALDACTONE) 25 MG tablet Take 1 tablet (25 mg total) by mouth daily. 90 tablet 3   No current facility-administered medications for this visit.    Allergies: Sulfa antibiotics and Tylenol [acetaminophen]  Past Medical History:  Diagnosis Date   Allergy    seasonal   Arthritis    Diabetes mellitus without complication (HCC)    Fibromyalgia    dr. Jimmy Footman   GERD (gastroesophageal reflux disease)    Hyperlipidemia    Hypertension    Wheezes    with season changes     Past Surgical History:  Procedure Laterality Date   CESAREAN SECTION  1993   with BTL   COLONOSCOPY  2017   Dr.Armbruster   COLONOSCOPY W/ POLYPECTOMY  2006   BENIGN ADENOMA    POLYPECTOMY     TONSILLECTOMY AND ADENOIDECTOMY      Family History  Problem Relation Age of Onset   Hypertension Mother    Diabetes Sister    Cancer Maternal Grandmother        OVARIAN   Ovarian cancer Maternal Grandmother    Colon cancer Neg Hx    Colon polyps Neg Hx    Esophageal cancer Neg Hx    Rectal cancer Neg Hx    Stomach  cancer Neg Hx     Social History   Tobacco Use   Smoking status: Never    Passive exposure: Never   Smokeless tobacco: Never  Substance Use Topics   Alcohol use: No    Alcohol/week: 0.0 standard drinks of alcohol    Subjective:   Presents for yearly CPE; continuing to work with pain management;  Rheumatology has discontinued Reclast- will need DEXA in 2025;  Ophthalmology every 4-6 months;  Requesting referral to hand specialist; Opted not to take Metformin as prescribed last year;   Review of Systems  Constitutional: Negative.   HENT: Negative.    Eyes: Negative.   Respiratory: Negative.    Cardiovascular: Negative.   Gastrointestinal: Negative.   Genitourinary: Negative.   Musculoskeletal:  Positive for back pain, joint pain and myalgias.  Skin: Negative.   Neurological: Negative.   Endo/Heme/Allergies: Negative.    Psychiatric/Behavioral: Negative.       Objective:  Vitals:   12/03/22 0839  BP: 118/78  Pulse: 62  Resp: 18  Temp: 98.9 F (37.2 C)  TempSrc: Oral  SpO2: 99%  Weight: 171 lb (77.6 kg)  Height: 5' (1.524 m)    General: Well developed, well nourished, in no acute distress  Skin : Warm and dry.  Head: Normocephalic and atraumatic  Eyes: Sclera and conjunctiva clear; pupils round and reactive to light; extraocular movements intact  Ears: External normal; canals clear; tympanic membranes normal  Oropharynx: Pink, supple. No suspicious lesions  Neck: Supple without thyromegaly, adenopathy  Lungs: Respirations unlabored; clear to auscultation bilaterally without wheeze, rales, rhonchi  CVS exam: normal rate and regular rhythm.  Abdomen: Soft; nontender; nondistended; normoactive bowel sounds; no masses or hepatosplenomegaly  Musculoskeletal: No deformities; no active joint inflammation  Extremities: No edema, cyanosis, clubbing  Vessels: Symmetric bilaterally  Neurologic: Alert and oriented; speech intact; face symmetrical; moves all extremities well; CNII-XII intact without focal deficit   Assessment:  1. PE (physical exam), annual   2. Lipid screening   3. Elevated glucose   4. Right hand pain     Plan:  Age appropriate preventive healthcare needs addressed; encouraged regular eye doctor and dental exams; encouraged regular exercise; will update labs and refills as needed today; follow-up in 1 year, sooner prn. Pap smear, DEXA and colonoscopy will be in 2025;    No follow-ups on file.  Orders Placed This Encounter  Procedures   CBC with Differential/Platelet   Comp Met (CMET)   Lipid panel   Hemoglobin A1c   TSH   Ambulatory referral to Orthopedics    Referral Priority:   Routine    Referral Type:   Consultation    Number of Visits Requested:   1    Requested Prescriptions   Signed Prescriptions Disp Refills   amLODipine (NORVASC) 10 MG tablet 90 tablet 3     Sig: Take 1 tablet (10 mg total) by mouth daily.   atorvastatin (LIPITOR) 10 MG tablet 90 tablet 3    Sig: Take 1 tablet (10 mg total) by mouth daily at 6 PM.   spironolactone (ALDACTONE) 25 MG tablet 90 tablet 3    Sig: Take 1 tablet (25 mg total) by mouth daily.   omeprazole (PRILOSEC) 40 MG capsule 90 capsule 3    Sig: Take 1 capsule (40 mg total) by mouth daily.

## 2022-12-08 ENCOUNTER — Encounter: Payer: Self-pay | Admitting: Physical Medicine and Rehabilitation

## 2022-12-08 ENCOUNTER — Encounter: Payer: 59 | Attending: Physical Medicine and Rehabilitation | Admitting: Physical Medicine and Rehabilitation

## 2022-12-08 DIAGNOSIS — M5412 Radiculopathy, cervical region: Secondary | ICD-10-CM | POA: Diagnosis not present

## 2022-12-08 DIAGNOSIS — M797 Fibromyalgia: Secondary | ICD-10-CM | POA: Diagnosis not present

## 2022-12-08 NOTE — Progress Notes (Addendum)
Subjective:    Patient ID: Courtney Guzman, female    DOB: 1961/03/31, 62 y.o.   MRN: [id number]  HPI An audio/video tele-health visit is felt to be the most appropriate encounter for this patient at this time. This is a follow up tele-visit via Mychart video. The patient is at home. MD is at office. Prior to scheduling this appointment, our staff discussed the limitations of evaluation and management by telemedicine and the availability of in-person appointments. The patient expressed understanding and agreed to proceed.   1) Fibromyalgia: -symptoms stable -still using the gabapentin -hands are numb and tingly -has done PT in the past and it did not help much -she tried dry needling -she asks about Biowave and Nexwave- she is interested in the [facility]  2) Numbness and tingling in hands: -not worse in the morning Sometimes present longer than others -sometimes she does not feel it during the midday -she has carpal tunnel release of both hands  Pain Inventory Average Pain 8 Pain Right Now 6 My pain is sharp, burning, tingling, and aching  In the last 24 hours, has pain interfered with the following? General activity 0 Relation with others 0 Enjoyment of life 0 What TIME of day is your pain at its worst? evening and night Sleep (in general) Fair  Pain is worse with: inactivity and long day Pain improves with: rest and therapy/exercise Relief from Meds: 0  Family History  Problem Relation Age of Onset   Hypertension Mother    Diabetes Sister    Cancer Maternal Grandmother        OVARIAN   Ovarian cancer Maternal Grandmother    Colon cancer Neg Hx    Colon polyps Neg Hx    Esophageal cancer Neg Hx    Rectal cancer Neg Hx    Stomach cancer Neg Hx    Social History   Socioeconomic History   Marital status: Married    Spouse name: Not on file   Number of children: Not on file   Years of education: Not on file   Highest education level: Not on file  Occupational  History   Not on file  Tobacco Use   Smoking status: Never    Passive exposure: Never   Smokeless tobacco: Never  Vaping Use   Vaping status: Never Used  Substance and Sexual Activity   Alcohol use: No    Alcohol/week: 0.0 standard drinks of alcohol   Drug use: No   Sexual activity: Yes    Partners: Male    Birth control/protection: Surgical    Comment: TUBAL LIGATION, 1st sexual encounter at 42, has had 5   Other Topics Concern   Not on file  Social History Narrative   Not on file   Social Determinants of Health   Financial Resource Strain: Not on file  Food Insecurity: Not on file  Transportation Needs: Not on file  Physical Activity: Not on file  Stress: Not on file  Social Connections: Not on file   Past Surgical History:  Procedure Laterality Date   CESAREAN SECTION  1993   with BTL   COLONOSCOPY  2017   Dr.Armbruster   COLONOSCOPY W/ POLYPECTOMY  2006   BENIGN ADENOMA    POLYPECTOMY     TONSILLECTOMY AND ADENOIDECTOMY     Past Surgical History:  Procedure Laterality Date   CESAREAN SECTION  1993   with BTL   COLONOSCOPY  2017   Dr.Armbruster   COLONOSCOPY W/ POLYPECTOMY  2006  BENIGN ADENOMA    POLYPECTOMY     TONSILLECTOMY AND ADENOIDECTOMY     Past Medical History:  Diagnosis Date   Allergy    seasonal   Arthritis    Diabetes mellitus without complication (HCC)    Fibromyalgia    dr. Jimmy Footman   GERD (gastroesophageal reflux disease)    Hyperlipidemia    Hypertension    Wheezes    with season changes    LMP 02/24/2008   Opioid Risk Score:   Fall Risk Score:  `1  Depression screen [facility] 2/9     02/03/2022    9:19 AM 07/23/2021    9:32 AM 03/21/2021    8:25 AM 12/25/2020    1:59 PM 11/05/2020   10:01 AM 01/11/2020    9:00 AM 11/02/2019   11:52 AM  Depression screen PHQ 2/9  Decreased Interest 0 0 0 0 0 0 0  Down, Depressed, Hopeless 0 0 0 0 0 0 0  PHQ - 2 Score 0 0 0 0 0 0 0    Review of Systems  Musculoskeletal:  Positive for back  pain.       Bilateral arm pain  All other systems reviewed and are negative.      Objective:   Physical Exam PRIOR EXAM Gen: no distress, normal appearing HEENT: oral mucosa pink and moist, NCAT Cardio: Reg rate Chest: normal effort, normal rate of breathing Abd: soft, non-distended Ext: no edema Psych: pleasant, normal affect Skin: intact Neuro: Alert and oriented x3       Assessment & Plan:  1) Chronic Pain Syndrome secondary to fibromyalgia -failed gabapentin, physical therapy Prescribed Zynex Nexwave, cervical traction device Prescribing Home Zynex NexWave Stimulator Device and supplies as needed. IFC, NMES and TENS medically necessary Treatment Rx: Daily @ 30-40 minutes per treatment PRN. Zynex NexWave only, no substitutions. Treatment Goals: 1) To reduce and/or eliminate pain 2) To improve functional capacity and Activities of daily living 3) To reduce or prevent the need for oral medications 4) To improve circulation in the injured region 5) To decrease or prevent muscle spasm and muscle atrophy 6) To provide a self-management tool to the patient The patient has not sufficiently improved with conservative care. Numerous studies indexed by Medline and PubMed.gov have shown Neuromuscular, Interferential, and TENS stimulators to reduce pain, improve function, and reduce medication use in injured patients. Continued use of this evidence based, safe, drug free treatment is both reasonable and medically necessary at this time.  -Discussed current symptoms of pain and history of pain.  -decrease gabapentin to BID for three days, then decrease to just at night, after three days of that stop gabapentin -Discussed benefits of exercise in reducing pain. -Discussed following foods that may reduce pain: 1) Ginger (especially studied for arthritis)- reduce leukotriene production to decrease inflammation 2) Blueberries- high in phytonutrients that decrease inflammation 3) Salmon- marine  omega-3s reduce joint swelling and pain 4) Pumpkin seeds- reduce inflammation 5) dark chocolate- reduces inflammation 6) turmeric- reduces inflammation 7) tart cherries - reduce pain and stiffness 8) extra virgin olive oil - its compound olecanthal helps to block prostaglandins  9) chili peppers- can be eaten or applied topically via capsaicin 10) mint- helpful for headache, muscle aches, joint pain, and itching 11) garlic- reduces inflammation  Link to further information on diet for chronic pain: [email or web address]   2) Numbness and tingling: -discussed current symptoms -discussed that she is working on improving her posture, discussed this can also be helpful for brain  health.  -discussed PT -continue resistance bands -Prescribed Zynex Nexwave, cervical traction device Prescribing Home Zynex NexWave Stimulator Device and supplies as needed. IFC, NMES and TENS medically necessary Treatment Rx: Daily @ 30-40 minutes per treatment PRN. Zynex NexWave only, no substitutions. Treatment Goals: 1) To reduce and/or eliminate pain 2) To improve functional capacity and Activities of daily living 3) To reduce or prevent the need for oral medications 4) To improve circulation in the injured region 5) To decrease or prevent muscle spasm and muscle atrophy 6) To provide a self-management tool to the patient The patient has not sufficiently improved with conservative care. Numerous studies indexed by Medline and PubMed.gov have shown Neuromuscular, Interferential, and TENS stimulators to reduce pain, improve function, and reduce medication use in injured patients. Continued use of this evidence based, safe, drug free treatment is both reasonable and medically necessary at this time.

## 2022-12-09 ENCOUNTER — Encounter: Payer: Self-pay | Admitting: Family

## 2022-12-09 ENCOUNTER — Other Ambulatory Visit: Payer: Self-pay | Admitting: Family

## 2022-12-09 MED ORDER — DEXCOM G7 RECEIVER DEVI: 6 refills | Status: DC

## 2022-12-09 MED ORDER — DEXCOM G7 SENSOR MISC: 6 refills | Status: DC

## 2022-12-16 ENCOUNTER — Telehealth: Payer: Self-pay

## 2022-12-16 NOTE — Telephone Encounter (Signed)
Pharmacy Patient Advocate Encounter   Received notification from CoverMyMeds that prior authorization for Dexcom G7 Sensor is required/requested.   Insurance verification completed.   The patient is insured through [facility] .   Per test claim: PA required; PA submitted to [facility] via CoverMyMeds Key/confirmation #/EOC [id number] Status is pending

## 2022-12-22 ENCOUNTER — Encounter: Payer: Self-pay | Admitting: Physical Medicine and Rehabilitation

## 2023-01-01 ENCOUNTER — Other Ambulatory Visit (INDEPENDENT_AMBULATORY_CARE_PROVIDER_SITE_OTHER): Payer: 59

## 2023-01-01 ENCOUNTER — Ambulatory Visit: Payer: 59 | Admitting: Orthopedic Surgery

## 2023-01-01 DIAGNOSIS — M79642 Pain in left hand: Secondary | ICD-10-CM

## 2023-01-01 DIAGNOSIS — M65311 Trigger thumb, right thumb: Secondary | ICD-10-CM

## 2023-01-01 DIAGNOSIS — M79641 Pain in right hand: Secondary | ICD-10-CM

## 2023-01-01 NOTE — Progress Notes (Signed)
Courtney Guzman - 62 y.o. female MRN [id number]  Date of birth: 12/10/1960  Office Visit Note: Visit Date: 01/01/2023 PCP: Olive Bass, FNP Referred by: Olive Bass,*  Subjective: Chief Complaint  Patient presents with   Right Hand - Pain   HPI: Courtney Guzman is a pleasant 62 y.o. female who presents today for evaluation of ongoing right thumb trigger digit that is refractory to conservative care.  She has undergone prior injection in February of this year with transient relief, however symptoms have recurred.  She is having notable clicking and locking on a regular basis.  Denies any significant numbness or tingling.  She is well-controlled diabetic, recent A1c 6.5.  Pertinent ROS were reviewed with the patient and found to be negative unless otherwise specified above in HPI.   Visit Reason: right thumb  Hand dominance: right Occupation:  Diabetic: Yes / 6.5 Heart/Lung History: none Blood Thinners:  none  Prior Testing/EMG: none Injections (Date): 06/10/22 Treatments: injection Prior Surgery: none   Assessment & Plan: Visit Diagnoses:  1. Trigger thumb, right thumb   2. Bilateral hand pain     Plan: Extensive discussion was had the patient today regarding her right thumb trigger digit.  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  She would like to have surgery performed under local anesthesia.  Will move forward with surgical scheduling at the next available date.  Risks and benefits of the procedure were discussed, risks including but not limited to infection, bleeding, scarring, stiffness, nerve injury, tendon injury, vascular injury, hardware complication if utilized, recurrence of symptoms and need for subsequent operation.  Patient expressed understanding.      Follow-up: No follow-ups on file.   Meds & Orders:  No orders of the defined types were placed in this encounter.   Orders Placed This Encounter  Procedures   XR Hand Complete Right   XR Hand Complete Left     Procedures: No procedures performed      Clinical History: No specialty comments available.  She reports that she has never smoked. She has never been exposed to tobacco smoke. She has never used smokeless tobacco.  Recent Labs    12/03/22 0923  HGBA1C 6.5    Objective:   Vital Signs: LMP 02/24/2008   Physical Exam  Gen: Well-appearing, in no acute distress; non-toxic CV: Regular Rate. Well-perfused. Warm.  Resp: Breathing unlabored on room air; no wheezing. Psych: Fluid speech in conversation; appropriate affect; normal thought process  Ortho Exam Right hand: - Significant tenderness at the A1 pulley, notable nodule, clicking seen with deep flexion, IP range of motion right thumb is limited 0-45 active (IP range of motion contralateral side 0-85) - Hand is warm well-perfused, sensation intact distally  Imaging: XR Hand Complete Left  Result Date: 01/01/2023 X-rays of the left hand, multiple views were obtained today X-rays demonstrate stable appearance of the radiocarpal and midcarpal articulations without significant abnormalities.  There is mild degenerative change at the thumb [facility] articulation.  Bone mineralization is appropriate.  XR Hand Complete Right  Result Date: 01/01/2023 X-rays of the right hand, multiple views were obtained today X-rays demonstrate stable appearance of the radiocarpal and midcarpal articulations without significant abnormalities.  There is mild degenerative change at the thumb [facility] articulation.  Bone mineralization is appropriate.   Past Medical/Family/Surgical/Social History: Medications & Allergies reviewed per EMR, new medications  updated. Patient Active Problem List   Diagnosis Date Noted   Osteoarthritis 10/30/2018   Iron deficiency anemia 10/30/2018   Essential hypertension  08/11/2016   Primary insomnia 07/01/2016   Type 2 diabetes mellitus without complication, without long-term current use of insulin (HCC) 06/14/2015   Hyperlipidemia 05/10/2014   Osteopenia 03/22/2012   GERD (gastroesophageal reflux disease)    Fibromyalgia syndrome 07/26/2008   Past Medical History:  Diagnosis Date   Allergy    seasonal   Arthritis    Diabetes mellitus without complication (HCC)    Fibromyalgia    dr. Jimmy Footman   GERD (gastroesophageal reflux disease)    Hyperlipidemia    Hypertension    Wheezes    with season changes    Family History  Problem Relation Age of Onset   Hypertension Mother    Diabetes Sister    Cancer Maternal Grandmother        OVARIAN   Ovarian cancer Maternal Grandmother    Colon cancer Neg Hx    Colon polyps Neg Hx    Esophageal cancer Neg Hx    Rectal cancer Neg Hx    Stomach cancer Neg Hx    Past Surgical History:  Procedure Laterality Date   CESAREAN SECTION  1993   with BTL   COLONOSCOPY  2017   Dr.Armbruster   COLONOSCOPY W/ POLYPECTOMY  2006   BENIGN ADENOMA    POLYPECTOMY     TONSILLECTOMY AND ADENOIDECTOMY     Social History   Occupational History   Not on file  Tobacco Use   Smoking status: Never    Passive exposure: Never   Smokeless tobacco: Never  Vaping Use   Vaping status: Never Used  Substance and Sexual Activity   Alcohol use: No    Alcohol/week: 0.0 standard drinks of alcohol   Drug use: No   Sexual activity: Yes    Partners: Male    Birth control/protection: Surgical    Comment: TUBAL LIGATION, 1st sexual encounter at 76, has had 5     Courtney Guzman, M.D. [facility] 9:05 AM

## 2023-01-28 ENCOUNTER — Encounter: Payer: Self-pay | Admitting: Physical Medicine and Rehabilitation

## 2023-01-28 ENCOUNTER — Encounter: Payer: 59 | Attending: Physical Medicine and Rehabilitation | Admitting: Physical Medicine and Rehabilitation

## 2023-01-28 VITALS — BP 124/81 | HR 61 | Ht 60.0 in | Wt 160.8 lb

## 2023-01-28 DIAGNOSIS — M7912 Myalgia of auxiliary muscles, head and neck: Secondary | ICD-10-CM | POA: Insufficient documentation

## 2023-01-28 DIAGNOSIS — M7918 Myalgia, other site: Secondary | ICD-10-CM | POA: Diagnosis present

## 2023-01-28 MED ORDER — LIDOCAINE HCL 1 % IJ SOLN
3.0000 mL | Freq: Once | INTRAMUSCULAR | Status: AC
Start: 2023-01-28 — End: 2023-01-28
  Administered 2023-01-28: 3 mL

## 2023-01-28 NOTE — Progress Notes (Signed)

## 2023-02-08 ENCOUNTER — Other Ambulatory Visit: Payer: Self-pay | Admitting: Orthopedic Surgery

## 2023-02-08 ENCOUNTER — Encounter: Payer: Self-pay | Admitting: Orthopedic Surgery

## 2023-02-08 DIAGNOSIS — M65311 Trigger thumb, right thumb: Secondary | ICD-10-CM

## 2023-02-08 NOTE — Telephone Encounter (Signed)
I advised [facility] patient is requesting Anesthesia to change orders to MAC. I spoke with patient and advised her to be NPO after midnight the night before surgery, and have a driver to and from surgery. Patient understood.

## 2023-02-09 ENCOUNTER — Encounter: Payer: Self-pay | Admitting: Physical Medicine and Rehabilitation

## 2023-02-09 ENCOUNTER — Encounter: Payer: Self-pay | Admitting: Orthopedic Surgery

## 2023-02-11 DIAGNOSIS — M65311 Trigger thumb, right thumb: Secondary | ICD-10-CM | POA: Diagnosis not present

## 2023-02-22 ENCOUNTER — Encounter: Payer: Self-pay | Admitting: Orthopedic Surgery

## 2023-02-26 ENCOUNTER — Ambulatory Visit: Payer: 59 | Admitting: Rehabilitative and Restorative Service Providers"

## 2023-02-26 ENCOUNTER — Ambulatory Visit (INDEPENDENT_AMBULATORY_CARE_PROVIDER_SITE_OTHER): Payer: 59 | Admitting: Orthopedic Surgery

## 2023-02-26 DIAGNOSIS — R278 Other lack of coordination: Secondary | ICD-10-CM | POA: Diagnosis not present

## 2023-02-26 DIAGNOSIS — M25641 Stiffness of right hand, not elsewhere classified: Secondary | ICD-10-CM

## 2023-02-26 DIAGNOSIS — R600 Localized edema: Secondary | ICD-10-CM | POA: Diagnosis not present

## 2023-02-26 DIAGNOSIS — M6281 Muscle weakness (generalized): Secondary | ICD-10-CM | POA: Diagnosis not present

## 2023-02-26 DIAGNOSIS — M25541 Pain in joints of right hand: Secondary | ICD-10-CM

## 2023-02-26 DIAGNOSIS — M65311 Trigger thumb, right thumb: Secondary | ICD-10-CM

## 2023-02-26 NOTE — Progress Notes (Unsigned)
   Avett Buonocore - 62 y.o. female MRN [id number]  Date of birth: 1960-07-22  Office Visit Note: Visit Date: 02/26/2023 PCP: Olive Bass, FNP Referred by: Olive Bass,*  Subjective:  HPI: Jaleiya Seith is a 62 y.o. female who presents today for follow up 2 weeks status post right thumb trigger release.  Pertinent ROS were reviewed with the patient and found to be negative unless otherwise specified above in HPI.   Assessment & Plan: Visit Diagnoses: No diagnosis found.  Plan: ***  Follow-up: No follow-ups on file.   Meds & Orders: No orders of the defined types were placed in this encounter.  No orders of the defined types were placed in this encounter.    Procedures: No procedures performed       Objective:   Vital Signs: LMP 02/24/2008   Ortho Exam ***  Imaging: No results found.   Marilyne Haseley Trevor Mace, M.D. [facility] 8:45 AM

## 2023-02-26 NOTE — Therapy (Signed)
OUTPATIENT OCCUPATIONAL THERAPY ORTHO EVALUATION  Patient Name: Courtney Guzman MRN: [id number] DOB:09/02/1960, 62 y.o., female Today's Date: 02/26/2023  PCP: Ria Clock, FNP REFERRING PROVIDER:  Samuella Cota, MD    END OF SESSION:  OT End of Session - 02/26/23 0925     Visit Number 1    Number of Visits 1    Authorization Type [employer]    OT Start Time 0926    OT Stop Time 1007    OT Time Calculation (min) 41 min    Activity Tolerance Patient tolerated treatment well;No increased pain;Patient limited by fatigue;Patient limited by pain    Behavior During Therapy [facility] for tasks assessed/performed             Past Medical History:  Diagnosis Date   Allergy    seasonal   Arthritis    Diabetes mellitus without complication (HCC)    Fibromyalgia    dr. Jimmy Footman   GERD (gastroesophageal reflux disease)    Hyperlipidemia    Hypertension    Wheezes    with season changes    Past Surgical History:  Procedure Laterality Date   CESAREAN SECTION  1993   with BTL   COLONOSCOPY  2017   Dr.Armbruster   COLONOSCOPY W/ POLYPECTOMY  2006   BENIGN ADENOMA    POLYPECTOMY     TONSILLECTOMY AND ADENOIDECTOMY     Patient Active Problem List   Diagnosis Date Noted   Osteoarthritis 10/30/2018   Iron deficiency anemia 10/30/2018   Essential hypertension 08/11/2016   Primary insomnia 07/01/2016   Type 2 diabetes mellitus without complication, without long-term current use of insulin (HCC) 06/14/2015   Hyperlipidemia 05/10/2014   Osteopenia 03/22/2012   GERD (gastroesophageal reflux disease)    Fibromyalgia syndrome 07/26/2008    ONSET DATE: 02/11/23  REFERRING DIAG: M65.311 (ICD-10-CM) - Trigger thumb, right thumb   THERAPY DIAG:  Pain in joint of right hand - Plan: Ot plan of care cert/re-cert  Localized edema - Plan: Ot plan of care cert/re-cert  Other lack of coordination - Plan: Ot plan of care cert/re-cert  Muscle weakness (generalized) - Plan: Ot plan of care  cert/re-cert  Stiffness of right hand, not elsewhere classified - Plan: Ot plan of care cert/re-cert  Rationale for Evaluation and Treatment: Rehabilitation  SUBJECTIVE:   SUBJECTIVE STATEMENT: She is 2 weeks s/p Rt thumb TFR.   She states having some pain and also complications of fibromyalgia symptoms.   PERTINENT HISTORY:  She had injection in Feb 2024 which only provided temporary relief and she went ahead with TFR to Rt thumb on 02/11/23. She is a diabetic.   PRECAUTIONS: None  RED FLAGS: None   WEIGHT BEARING RESTRICTIONS: Yes <5# for next 2-4 weeks recommended  PAIN:  Are you having pain? Yes: NPRS scale: 6/10 Pain location: Rt thumb sx area Pain description: aching Aggravating factors: weight bearing Relieving factors: rest, ice,heat  FALLS: Has patient fallen in last 6 months? No   PLOF: Independent  PATIENT GOALS: To decrease pain and improve the use of her right dominant hand    OBJECTIVE: (All objective assessments below are from initial evaluation on: 02/26/23 unless otherwise specified.)    HAND DOMINANCE: Right   ADLs: Overall ADLs: States decreased ability to grab, hold household objects, pain and difficulty to open containers, perform FMS tasks (manipulate fasteners on clothing).     FUNCTIONAL OUTCOME MEASURES: Eval: Quick DASH 22% impairment today  (Higher % Score  =  More Impairment)  UPPER EXTREMITY ROM     Shoulder to Wrist AROM Right eval Left eval  Wrist flexion 52 72  Wrist extension 51 72  (Blank rows = not tested)   Hand AROM Right eval  Full Fist Ability (or Gap to Distal Palmar Crease) Full in fingers  Thumb Opposition  (Kapandji Scale)  7/10  Thumb MCP (0-60) 53  Thumb IP (0-80) 32  (Blank rows = not tested)   UPPER EXTREMITY MMT:    Eval:  NT at eval due to recent and still healing injuries. Expected to improve with HEP and recommendations.   HAND FUNCTION: Eval: Observed weakness in affected rt hand/arm,  grossly 3-/5 MMT, but specific gripping and resistance training contraindicated today. Expected to improve with HEP and recommendations.    COORDINATION: Eval: Mild observed coordination impairments with affected rt hand/arm. Expected to improve with HEP and recommendations.   SENSATION: Eval:  Light touch intact today, though diminished around sx area. Expected to improve with HEP and recommendations.   EDEMA:   Eval:  Mildly swollen in rt thumb today.  Expected to improve with HEP and recommendations.   COGNITION: Eval: Overall cognitive status: WFL for evaluation today   OBSERVATIONS:   Eval: Surgical site is clean and no overt signs of infection, no drainage, signs of dehiscence, etc.  Tenderness and swelling is within normal limits for post-op timeframe.     TODAY'S TREATMENT:  Post-evaluation treatment:   The patient was given safety information for managing post-op wound, including not to soak wound, to keep clean and dry, to start with scar mobilizations in 1 week (03/04/23)  if the wound is well healed/closed. They should monitor for signs of infection.   A band-aide was provided as she was still healing today, and she should use a slick of vaseline and keep it covered for about 1 week. They should contact the surgeon with any concerns immediately.   The patient should also avoid any strong gripping, push, pull, weight bearing or repetitive motion for the next month.  They should not be doing painful activities.   After a month, they can progressively return to all light, normal activities. Sports and heavy weight lifting  should be withheld for a total of 3 months.   The patient was also educated (explanation and demonstration) on the following home exercise program including tolerable range of motion, gentle passive range of motion, scar care, progressive desensitization, prevention of soft tissue contractures, etc. The patient states understanding all directions and feels  comfortable with doing this at home, self-management, and following up with the surgeon as needed/scheduled.    Exercises - Wrist Prayer Stretch  - 3-4 x daily - 3 reps - 15 seconds hold - Wrist Flexion Stretch  - 3-4 x daily - 3 reps - 15 sec hold - Tendon Glides  - 3-4 x daily - 5 reps - 3 second hold - Thumb Opposition  - 3-4 x daily - 10 reps - Thumb AROM IP Blocking  - 3-4 x daily - 10 reps - Thumb stretch  - 3-4 x daily - 3 reps - 15 hold  Patient Education - Scar Massage      PATIENT EDUCATION: Education details: See tx section above for details  Person educated: Patient Education method: Verbal Instruction, Teach back, Handouts  Education comprehension: States and demonstrates understanding   HOME EXERCISE PROGRAM: Access Code: [email or web address] URL: [email or web address] Date: 02/26/2023 Prepared by: Fannie Knee   GOALS: Goals reviewed with patient? Yes  SHORT TERM GOALS: (STG required if POC>30 days) Target Date: 02/26/23  1.  Pt will demo/state understanding of initial HEP and therapist recommendations to improve pain levels, improve motions and ability and eventually return to normal activities.   Goal status: MET    ASSESSMENT:  CLINICAL IMPRESSION: Patient is a 62 y.o. female who was seen today for occupational therapy evaluation for swelling, pain, weakness and decreased functional ability following Rt thumb TFR procedure. The patient is appropriate for OT rehab services and benefited from treatment today. The patient got copious education/treatment today for self-care, wound management, exercises and how to transition to normal activities in the next 4-6 weeks. The patient agrees that they can manage these recommendations independently, and should not need to return for follow up visits. The patient should follow up with the surgeon with any concerns, and could possibly return to therapy, if needed, with a new order.  The patient will discharge  therapy treatment after this visit.     PERFORMANCE DEFICITS: in functional skills including ADLs, IADLs, coordination, dexterity, sensation, edema, ROM, strength, pain, fascial restrictions, flexibility, Fine motor control, body mechanics, endurance, decreased knowledge of precautions, wound, and UE functional use, cognitive skills including problem solving and safety awareness, and psychosocial skills including coping strategies, environmental adaptation, and habits.   IMPAIRMENTS: are limiting patient from ADLs, IADLs, rest and sleep, leisure, and social participation.   COMORBIDITIES: may have co-morbidities  that affects occupational performance. Patient will benefit from skilled OT to address above impairments and improve overall function.  MODIFICATION OR ASSISTANCE TO COMPLETE EVALUATION: No modification of tasks or assist necessary to complete an evaluation.  OT OCCUPATIONAL PROFILE AND HISTORY: Problem focused assessment: Including review of records relating to presenting problem.  CLINICAL DECISION MAKING: LOW - limited treatment options, no task modification necessary  REHAB POTENTIAL: Excellent  EVALUATION COMPLEXITY: Low      PLAN:  OT FREQUENCY: one time visit  OT DURATION: 1 sessions 02/26/23  PLANNED INTERVENTIONS: self care/ADL training, therapeutic exercise, therapeutic activity, neuromuscular re-education, manual therapy, scar mobilization, passive range of motion, splinting, ultrasound, fluidotherapy, compression bandaging, moist heat, cryotherapy, contrast bath, patient/family education, energy conservation, coping strategies training, and Re-evaluation  RECOMMENDED OTHER SERVICES: none now   CONSULTED AND AGREED WITH PLAN OF CARE: Patient  PLAN FOR NEXT SESSION:   N/A    Fannie Knee, OTR/L, CHT 02/26/2023, 10:15 AM

## 2023-03-10 ENCOUNTER — Other Ambulatory Visit (HOSPITAL_COMMUNITY): Payer: Self-pay

## 2023-03-12 ENCOUNTER — Encounter: Payer: Self-pay | Admitting: Family

## 2023-03-12 NOTE — Telephone Encounter (Signed)
Care team updated and letter sent for eye exam notes.

## 2023-03-31 ENCOUNTER — Ambulatory Visit: Payer: 59 | Admitting: Orthopedic Surgery

## 2023-03-31 ENCOUNTER — Telehealth: Payer: Self-pay

## 2023-03-31 DIAGNOSIS — M65311 Trigger thumb, right thumb: Secondary | ICD-10-CM

## 2023-03-31 NOTE — Telephone Encounter (Signed)
PA denied.   We received your request for prior authorization for [facility] G7 RECEIVER, for the above member; however, [employer] has a denied request on file for [facility] G7 RECEIVER for this member. Please follow the appeals process outlined in the original denial or contact Prior Authorization Department at [phone] for further questions.

## 2023-03-31 NOTE — Telephone Encounter (Signed)
PA initiated via Covermymeds;KEY: [id number]. Awaiting determination.

## 2023-03-31 NOTE — Progress Notes (Signed)
   Alania Lupton - 62 y.o. female MRN [id number]  Date of birth: 1961/02/16  Office Visit Note: Visit Date: 03/31/2023 PCP: Olive Bass, FNP Referred by: Olive Bass,*  Subjective:  HPI: Maylea Kawczynski is a 62 y.o. female who presents today for follow up 7 weeks status post right thumb trigger release.  She is doing very well postoperatively.  No residual clicking or locking.  Has resumed activities as tolerated.  Pertinent ROS were reviewed with the patient and found to be negative unless otherwise specified above in HPI.   Assessment & Plan: Visit Diagnoses: No diagnosis found.  Plan: She has done very well postoperatively.  She has met all postoperative goals.  She has been discharged from occupational therapy.  At this juncture she should continue with activity as tolerated, follow-up as needed.  She is very pleased with her outcome.  Follow-up: No follow-ups on file.   Meds & Orders: No orders of the defined types were placed in this encounter.  No orders of the defined types were placed in this encounter.    Procedures: No procedures performed       Objective:   Vital Signs: LMP 02/24/2008   Ortho Exam Right thumb: - Well-healed incision at the base of the thumb, full digital range of motion without restriction at the IP joint, no residual clicking or locking, thumb opposition to the small finger [facility] - Sensation intact distally, hand is warm well-perfused  Imaging: No results found.   Cherith Tewell Trevor Mace, M.D. [facility] 8:57 AM

## 2023-04-02 ENCOUNTER — Other Ambulatory Visit: Payer: Self-pay | Admitting: Family

## 2023-04-02 DIAGNOSIS — J4520 Mild intermittent asthma, uncomplicated: Secondary | ICD-10-CM

## 2023-04-30 ENCOUNTER — Ambulatory Visit: Payer: 59 | Admitting: Physical Medicine and Rehabilitation

## 2023-05-20 ENCOUNTER — Other Ambulatory Visit: Payer: Self-pay | Admitting: Family

## 2023-05-20 DIAGNOSIS — Z1231 Encounter for screening mammogram for malignant neoplasm of breast: Secondary | ICD-10-CM

## 2023-07-05 ENCOUNTER — Ambulatory Visit
Admission: RE | Admit: 2023-07-05 | Discharge: 2023-07-05 | Disposition: A | Discharge status: Home / Self Care | Payer: 59 | Source: Ambulatory Visit | Attending: Family | Admitting: Family

## 2023-07-05 DIAGNOSIS — Z1231 Encounter for screening mammogram for malignant neoplasm of breast: Secondary | ICD-10-CM

## 2023-07-07 ENCOUNTER — Encounter: Payer: Self-pay | Admitting: Family

## 2023-07-15 LAB — OPHTHALMOLOGY REPORT-SCANNED

## 2023-08-09 LAB — HM DIABETES EYE EXAM

## 2023-09-27 ENCOUNTER — Encounter: Payer: Self-pay | Admitting: Gastroenterology

## 2023-10-05 ENCOUNTER — Encounter: Payer: Self-pay | Admitting: Family

## 2023-11-05 ENCOUNTER — Ambulatory Visit

## 2023-11-05 ENCOUNTER — Encounter: Payer: Self-pay | Admitting: Gastroenterology

## 2023-11-05 VITALS — Ht 60.0 in | Wt 155.0 lb

## 2023-11-05 DIAGNOSIS — Z860100 Personal history of colon polyps, unspecified: Secondary | ICD-10-CM

## 2023-11-05 MED ORDER — NA SULFATE-K SULFATE-MG SULF 17.5-3.13-1.6 GM/177ML PO SOLN
1.0000 | Freq: Once | ORAL | 0 refills | Status: AC
Start: 2023-11-05 — End: 2023-11-05

## 2023-11-05 NOTE — Progress Notes (Signed)

## 2023-11-19 ENCOUNTER — Ambulatory Visit: Admitting: Gastroenterology

## 2023-11-19 ENCOUNTER — Encounter: Payer: Self-pay | Admitting: Gastroenterology

## 2023-11-19 VITALS — BP 103/64 | HR 56 | Temp 97.9°F | Resp 11 | Ht 60.0 in | Wt 155.0 lb

## 2023-11-19 DIAGNOSIS — D122 Benign neoplasm of ascending colon: Secondary | ICD-10-CM | POA: Diagnosis not present

## 2023-11-19 DIAGNOSIS — Z860100 Personal history of colon polyps, unspecified: Secondary | ICD-10-CM

## 2023-11-19 DIAGNOSIS — K648 Other hemorrhoids: Secondary | ICD-10-CM

## 2023-11-19 DIAGNOSIS — Z1211 Encounter for screening for malignant neoplasm of colon: Secondary | ICD-10-CM | POA: Diagnosis present

## 2023-11-19 DIAGNOSIS — D120 Benign neoplasm of cecum: Secondary | ICD-10-CM

## 2023-11-19 DIAGNOSIS — D123 Benign neoplasm of transverse colon: Secondary | ICD-10-CM | POA: Diagnosis not present

## 2023-11-19 MED ORDER — SODIUM CHLORIDE 0.9 % IV SOLN: 500.0000 mL | Freq: Once | INTRAVENOUS | Status: DC

## 2023-11-19 NOTE — Op Note (Signed)
 [facility] Patient Name: Courtney Guzman Procedure Date: 11/19/2023 10:18 AM MRN: [id number] Endoscopist: Elspeth P. Leigh , MD, [id number] Age: 63 Referring MD:  Date of Birth: 04/16/1961 Gender: Female Account #: [id number] Procedure:                Colonoscopy Indications:              High risk colon cancer surveillance: Personal                            history of colonic polyps - 5 polyps removed 08/2020 Medicines:                Monitored Anesthesia Care Procedure:                Pre-Anesthesia Assessment:                           - Prior to the procedure, a History and Physical                            was performed, and patient medications and                            allergies were reviewed. The patient's tolerance of                            previous anesthesia was also reviewed. The risks                            and benefits of the procedure and the sedation                            options and risks were discussed with the patient.                            All questions were answered, and informed consent                            was obtained. Prior Anticoagulants: The patient has                            taken no anticoagulant or antiplatelet agents. ASA                            Grade Assessment: II - A patient with mild systemic                            disease. After reviewing the risks and benefits,                            the patient was deemed in satisfactory condition to                            undergo the procedure.  After obtaining informed consent, the colonoscope                            was passed under direct vision. Throughout the                            procedure, the patient's blood pressure, pulse, and                            oxygen saturations were monitored continuously. The                            Olympus Scope SN [id number] was introduced through the                             anus and advanced to the the cecum, identified by                            appendiceal orifice and ileocecal valve. The                            colonoscopy was performed without difficulty. The                            patient tolerated the procedure well. The quality                            of the bowel preparation was good. The ileocecal                            valve, appendiceal orifice, and rectum were                            photographed. Scope In: 10:25:12 AM Scope Out: 10:39:46 AM Scope Withdrawal Time: 0 hours 12 minutes 22 seconds  Total Procedure Duration: 0 hours 14 minutes 34 seconds  Findings:                 The perianal and digital rectal examinations were                            normal.                           A diminutive polyp was found in the cecum. The                            polyp was sessile. The polyp was removed with a                            cold snare. Resection and retrieval were complete.                           A diminutive polyp was found in the ascending  colon. The polyp was sessile.                           A 3 mm polyp was found in the splenic flexure. The                            polyp was sessile. The polyp was removed with a                            cold snare. Resection and retrieval were complete.                           Internal hemorrhoids were found during                            retroflexion. The hemorrhoids were small.                           The exam was otherwise without abnormality. Complications:            No immediate complications. Estimated blood loss:                            Minimal. Estimated Blood Loss:     Estimated blood loss was minimal. Impression:               - One diminutive polyp in the cecum, removed with a                            cold snare. Resected and retrieved.                           - One diminutive polyp in the ascending colon.                            - One 3 mm polyp at the splenic flexure, removed                            with a cold snare. Resected and retrieved.                           - Internal hemorrhoids.                           - The examination was otherwise normal. Recommendation:           - Patient has a contact number available for                            emergencies. The signs and symptoms of potential                            delayed complications were discussed with the  patient. Return to normal activities tomorrow.                            Written discharge instructions were provided to the                            patient.                           - Resume previous diet.                           - Continue present medications.                           - Await pathology results. Anticipate repeat                            colonoscopy in 5 years given burden of polyps                            removed in the past and findings on this exam. Elspeth P. Viliami Bracco, MD 11/19/2023 10:44:31 AM This report has been signed electronically.

## 2023-11-19 NOTE — Patient Instructions (Signed)

## 2023-11-19 NOTE — Progress Notes (Signed)
 [facility] History and Physical   Primary Care Physician:  Jason Leita Repine, FNP   Reason for Procedure:   History of colon polyps  Plan:    colonoscopy     HPI: Courtney Guzman is a 63 y.o. female  here for colonoscopy surveillance - 5 polyps removed 08/2020.   Patient denies any bowel symptoms at this time. No family history of colon cancer known in first degree relatives.. Otherwise feels well without any cardiopulmonary symptoms.   I have discussed risks / benefits of anesthesia and endoscopic procedure with Dickey Silvan and they wish to proceed with the exams as outlined today.    Past Medical History:  Diagnosis Date   Allergy    seasonal   Arthritis    Diabetes mellitus without complication (HCC)    Fibromyalgia    dr. eudelia   GERD (gastroesophageal reflux disease)    Hyperlipidemia    Hypertension    Wheezes    with season changes     Past Surgical History:  Procedure Laterality Date   CESAREAN SECTION  1993   with BTL   COLONOSCOPY  2017   Dr.Eean Buss   COLONOSCOPY W/ POLYPECTOMY  2006   BENIGN ADENOMA    POLYPECTOMY     TONSILLECTOMY AND ADENOIDECTOMY      Prior to Admission medications   Medication Sig Start Date End Date Taking? Authorizing Provider  amLODipine  (NORVASC ) 10 MG tablet Take 1 tablet (10 mg total) by mouth daily. 12/03/22  Yes Jason Leita Repine, FNP  atorvastatin  (LIPITOR) 10 MG tablet Take 1 tablet (10 mg total) by mouth daily at 6 PM. 12/03/22  Yes Jason Leita Repine, FNP  COMBIGAN 0.2-0.5 % ophthalmic solution Apply 1 drop to eye 2 (two) times daily. 07/23/20  Yes [provider]  omeprazole  (PRILOSEC) 40 MG capsule Take 1 capsule (40 mg total) by mouth daily. 12/03/22  Yes Jason Leita Repine, FNP  spironolactone  (ALDACTONE ) 25 MG tablet Take 1 tablet (25 mg total) by mouth daily. 12/03/22  Yes Jason Leita Repine, FNP  VYZULTA 0.024 % SOLN Place 0.024 % into both eyes at bedtime. 11/16/23  Yes  [provider]  albuterol  (VENTOLIN  HFA) 108 (90 Base) MCG/ACT inhaler TAKE 2 PUFFS BY MOUTH EVERY 4 HOURS AS NEEDED FOR WHEEZE 04/05/23   Jason Leita Repine, FNP  calcium  carbonate (OS-CAL) 600 MG TABS Take 600 mg by mouth 2 (two) times daily with a meal. Patient not taking: No sig reported    [provider]  ferrous sulfate 325 (65 FE) MG tablet Take 325 mg by mouth daily with breakfast.    [provider]  lidocaine  (LIDODERM ) 5 % Place 1 patch onto the skin daily. Remove & Discard patch within 12 hours or as directed by MD Patient not taking: No sig reported 06/13/19   Cheryl Waddell HERO, PA-C  Omega-3 Fatty Acids (FISH OIL) 300 MG CAPS Fish Oil    [provider]    Current Outpatient Medications  Medication Sig Dispense Refill   amLODipine  (NORVASC ) 10 MG tablet Take 1 tablet (10 mg total) by mouth daily. 90 tablet 3   atorvastatin  (LIPITOR) 10 MG tablet Take 1 tablet (10 mg total) by mouth daily at 6 PM. 90 tablet 3   COMBIGAN 0.2-0.5 % ophthalmic solution Apply 1 drop to eye 2 (two) times daily.     omeprazole  (PRILOSEC) 40 MG capsule Take 1 capsule (40 mg total) by mouth daily. 90 capsule 3   spironolactone  (ALDACTONE )  25 MG tablet Take 1 tablet (25 mg total) by mouth daily. 90 tablet 3   VYZULTA 0.024 % SOLN Place 0.024 % into both eyes at bedtime.     albuterol  (VENTOLIN  HFA) 108 (90 Base) MCG/ACT inhaler TAKE 2 PUFFS BY MOUTH EVERY 4 HOURS AS NEEDED FOR WHEEZE 8.5 each 3   calcium  carbonate (OS-CAL) 600 MG TABS Take 600 mg by mouth 2 (two) times daily with a meal. (Patient not taking: No sig reported)     ferrous sulfate 325 (65 FE) MG tablet Take 325 mg by mouth daily with breakfast.     lidocaine  (LIDODERM ) 5 % Place 1 patch onto the skin daily. Remove & Discard patch within 12 hours or as directed by MD (Patient not taking: No sig reported) 30 patch 2   Omega-3 Fatty Acids (FISH OIL) 300 MG CAPS Fish Oil     Current Facility-Administered  Medications  Medication Dose Route Frequency Provider Last Rate Last Admin   0.9 %  sodium chloride  infusion  500 mL Intravenous Once Danica Camarena, Elspeth SQUIBB, MD        Allergies as of 11/19/2023 - Review Complete 11/19/2023  Allergen Reaction Noted   Sulfa antibiotics Other (See Comments) 02/17/2011   Tylenol  [acetaminophen ] Itching 12/03/2022    Family History  Problem Relation Age of Onset   Colon cancer Mother    Hypertension Mother    Diabetes Sister    Cancer Maternal Grandmother        OVARIAN   Ovarian cancer Maternal Grandmother    Colon polyps Neg Hx    Esophageal cancer Neg Hx    Rectal cancer Neg Hx    Stomach cancer Neg Hx     Social History   Socioeconomic History   Marital status: Married    Spouse name: Not on file   Number of children: Not on file   Years of education: Not on file   Highest education level: Not on file  Occupational History   Not on file  Tobacco Use   Smoking status: Never    Passive exposure: Never   Smokeless tobacco: Never  Vaping Use   Vaping status: Never Used  Substance and Sexual Activity   Alcohol use: No    Alcohol/week: 0.0 standard drinks of alcohol   Drug use: No   Sexual activity: Yes    Partners: Male    Birth control/protection: Surgical    Comment: TUBAL LIGATION, 1st sexual encounter at 59, has had 5   Other Topics Concern   Not on file  Social History Narrative   Not on file   Social Drivers of Health   Financial Resource Strain: Not on file  Food Insecurity: Not on file  Transportation Needs: Not on file  Physical Activity: Not on file  Stress: Not on file  Social Connections: Not on file  Intimate Partner Violence: Not on file    Review of Systems: All other review of systems negative except as mentioned in the HPI.  Physical Exam: Vital signs BP (!) 89/61   Pulse 73   Temp 97.9 F (36.6 C)   Ht 5' (1.524 m)   Wt 155 lb (70.3 kg)   LMP 02/24/2008   SpO2 97%   BMI 30.27 kg/m   General:    Alert,  Well-developed, pleasant and cooperative in NAD Lungs:  Clear throughout to auscultation.   Heart:  Regular rate and rhythm Abdomen:  Soft, nontender and nondistended.   Neuro/Psych:  Alert and  cooperative. Normal mood and affect. A and O x 3  Marcey Naval, MD [facility]

## 2023-11-19 NOTE — Progress Notes (Signed)
 Report to PACU, RN, vss, BBS= Clear.

## 2023-11-19 NOTE — Progress Notes (Signed)
 Pt's states no medical or surgical changes since previsit or office visit.

## 2023-11-19 NOTE — Progress Notes (Signed)
 Called to room to assist during endoscopic procedure.  Patient ID and intended procedure confirmed with present staff. Received instructions for my participation in the procedure from the performing physician.

## 2023-11-22 ENCOUNTER — Telehealth: Payer: Self-pay

## 2023-11-22 NOTE — Telephone Encounter (Signed)
 Attempted to reach patient for follow up phone call. No answer, left voicemail to contact Dr. Hassan office with any questions or concerns.

## 2023-11-23 LAB — SURGICAL PATHOLOGY

## 2023-11-24 ENCOUNTER — Other Ambulatory Visit: Payer: Self-pay | Admitting: Physical Medicine and Rehabilitation

## 2023-11-24 DIAGNOSIS — G894 Chronic pain syndrome: Secondary | ICD-10-CM

## 2023-11-25 ENCOUNTER — Ambulatory Visit: Payer: Self-pay | Admitting: Gastroenterology

## 2023-12-07 ENCOUNTER — Ambulatory Visit: Payer: Self-pay | Admitting: Family

## 2023-12-07 ENCOUNTER — Other Ambulatory Visit (HOSPITAL_COMMUNITY)
Admission: RE | Admit: 2023-12-07 | Discharge: 2023-12-07 | Disposition: A | Discharge status: Home / Self Care | Source: Ambulatory Visit | Attending: Family | Admitting: Family

## 2023-12-07 ENCOUNTER — Telehealth: Payer: Self-pay | Admitting: Family

## 2023-12-07 ENCOUNTER — Ambulatory Visit (INDEPENDENT_AMBULATORY_CARE_PROVIDER_SITE_OTHER): Payer: 59 | Admitting: Family

## 2023-12-07 ENCOUNTER — Encounter: Payer: Self-pay | Admitting: Family

## 2023-12-07 VITALS — BP 128/78 | HR 61 | Temp 98.1°F | Ht 60.0 in | Wt 155.6 lb

## 2023-12-07 DIAGNOSIS — Z13220 Encounter for screening for lipoid disorders: Secondary | ICD-10-CM | POA: Diagnosis not present

## 2023-12-07 DIAGNOSIS — E559 Vitamin D deficiency, unspecified: Secondary | ICD-10-CM | POA: Diagnosis not present

## 2023-12-07 DIAGNOSIS — Z13820 Encounter for screening for osteoporosis: Secondary | ICD-10-CM

## 2023-12-07 DIAGNOSIS — Z124 Encounter for screening for malignant neoplasm of cervix: Secondary | ICD-10-CM

## 2023-12-07 DIAGNOSIS — Z0000 Encounter for general adult medical examination without abnormal findings: Secondary | ICD-10-CM | POA: Diagnosis not present

## 2023-12-07 DIAGNOSIS — R7309 Other abnormal glucose: Secondary | ICD-10-CM | POA: Diagnosis not present

## 2023-12-07 DIAGNOSIS — J4520 Mild intermittent asthma, uncomplicated: Secondary | ICD-10-CM

## 2023-12-07 LAB — CBC WITH DIFFERENTIAL/PLATELET
Basophils Absolute: 0 K/uL (ref 0.0–0.1)
Basophils Relative: 0.6 % (ref 0.0–3.0)
Eosinophils Absolute: 0.1 K/uL (ref 0.0–0.7)
Eosinophils Relative: 1.8 % (ref 0.0–5.0)
HCT: 39.2 % (ref 36.0–46.0)
Hemoglobin: 12.6 g/dL (ref 12.0–15.0)
Lymphocytes Relative: 47 % — ABNORMAL HIGH (ref 12.0–46.0)
Lymphs Abs: 2 K/uL (ref 0.7–4.0)
MCHC: 32.1 g/dL (ref 30.0–36.0)
MCV: 87.1 fl (ref 78.0–100.0)
Monocytes Absolute: 0.5 K/uL (ref 0.1–1.0)
Monocytes Relative: 10.6 % (ref 3.0–12.0)
Neutro Abs: 1.7 K/uL (ref 1.4–7.7)
Neutrophils Relative %: 40 % — ABNORMAL LOW (ref 43.0–77.0)
Platelets: 318 K/uL (ref 150.0–400.0)
RBC: 4.5 Mil/uL (ref 3.87–5.11)
RDW: 13.9 % (ref 11.5–15.5)
WBC: 4.4 K/uL (ref 4.0–10.5)

## 2023-12-07 LAB — COMPREHENSIVE METABOLIC PANEL WITH GFR
ALT: 11 U/L (ref 0–35)
AST: 18 U/L (ref 0–37)
Albumin: 4.5 g/dL (ref 3.5–5.2)
Alkaline Phosphatase: 69 U/L (ref 39–117)
BUN: 13 mg/dL (ref 6–23)
CO2: 29 meq/L (ref 19–32)
Calcium: 9.9 mg/dL (ref 8.4–10.5)
Chloride: 104 meq/L (ref 96–112)
Creatinine, Ser: 0.74 mg/dL (ref 0.40–1.20)
GFR: 85.83 mL/min (ref >60.00)
Glucose, Bld: 103 mg/dL — ABNORMAL HIGH (ref 70–99)
Potassium: 4.3 meq/L (ref 3.5–5.1)
Sodium: 140 meq/L (ref 135–145)
Total Bilirubin: 0.7 mg/dL (ref 0.2–1.2)
Total Protein: 7.3 g/dL (ref 6.0–8.3)

## 2023-12-07 LAB — LIPID PANEL
Cholesterol: 173 mg/dL (ref 0–200)
HDL: 70.7 mg/dL (ref >39.00)
LDL Cholesterol: 90 mg/dL (ref 0–99)
NonHDL: 102.42
Total CHOL/HDL Ratio: 2
Triglycerides: 61 mg/dL (ref 0.0–149.0)
VLDL: 12.2 mg/dL (ref 0.0–40.0)

## 2023-12-07 LAB — VITAMIN D 25 HYDROXY (VIT D DEFICIENCY, FRACTURES): VITD: 40.57 ng/mL (ref 30.00–100.00)

## 2023-12-07 LAB — HEMOGLOBIN A1C: Hgb A1c MFr Bld: 6.7 % — ABNORMAL HIGH (ref 4.6–6.5)

## 2023-12-07 MED ORDER — OMEPRAZOLE 40 MG PO CPDR: 40.0000 mg | DELAYED_RELEASE_CAPSULE | Freq: Every day | ORAL | 3 refills | Status: AC

## 2023-12-07 MED ORDER — SPIRONOLACTONE 25 MG PO TABS: 25.0000 mg | ORAL_TABLET | Freq: Every day | ORAL | 3 refills | Status: AC

## 2023-12-07 MED ORDER — ATORVASTATIN CALCIUM 10 MG PO TABS: 10.0000 mg | ORAL_TABLET | Freq: Every day | ORAL | 3 refills | Status: DC

## 2023-12-07 MED ORDER — ALBUTEROL SULFATE HFA 108 (90 BASE) MCG/ACT IN AERS
INHALATION_SPRAY | RESPIRATORY_TRACT | 3 refills | Status: AC
Start: 2023-12-07 — End: ?

## 2023-12-07 MED ORDER — AMLODIPINE BESYLATE 10 MG PO TABS: 10.0000 mg | ORAL_TABLET | Freq: Every day | ORAL | 3 refills | Status: AC

## 2023-12-07 NOTE — Progress Notes (Signed)
 Courtney Guzman is a 63 y.o. female with the following history as recorded in EpicCare:  Patient Active Problem List   Diagnosis Date Noted   Osteoarthritis 10/30/2018   Iron deficiency anemia 10/30/2018   Essential hypertension 08/11/2016   Primary insomnia 07/01/2016   Type 2 diabetes mellitus without complication, without long-term current use of insulin (HCC) 06/14/2015   Hyperlipidemia 05/10/2014   Osteopenia 03/22/2012   GERD (gastroesophageal reflux disease)    Fibromyalgia syndrome 07/26/2008    Current Outpatient Medications  Medication Sig Dispense Refill   COMBIGAN 0.2-0.5 % ophthalmic solution Apply 1 drop to eye 2 (two) times daily.     ferrous sulfate 325 (65 FE) MG tablet Take 325 mg by mouth daily with breakfast.     Omega-3 Fatty Acids (FISH OIL) 300 MG CAPS Fish Oil     VYZULTA 0.024 % SOLN Place 0.024 % into both eyes at bedtime.     albuterol  (VENTOLIN  HFA) 108 (90 Base) MCG/ACT inhaler TAKE 2 PUFFS BY MOUTH EVERY 4 HOURS AS NEEDED FOR WHEEZE 8.5 each 3   amLODipine  (NORVASC ) 10 MG tablet Take 1 tablet (10 mg total) by mouth daily. 90 tablet 3   atorvastatin  (LIPITOR) 10 MG tablet Take 1 tablet (10 mg total) by mouth daily at 6 PM. 90 tablet 3   omeprazole  (PRILOSEC) 40 MG capsule Take 1 capsule (40 mg total) by mouth daily. 90 capsule 3   spironolactone  (ALDACTONE ) 25 MG tablet Take 1 tablet (25 mg total) by mouth daily. 90 tablet 3   No current facility-administered medications for this visit.    Allergies: Sulfa antibiotics and Tylenol  [acetaminophen ]  Past Medical History:  Diagnosis Date   Allergy    seasonal   Arthritis    Diabetes mellitus without complication (HCC)    Fibromyalgia    dr. eudelia   GERD (gastroesophageal reflux disease)    Hyperlipidemia    Hypertension    Wheezes    with season changes     Past Surgical History:  Procedure Laterality Date   CESAREAN SECTION  1993   with BTL   COLONOSCOPY  2017   Dr.Armbruster   COLONOSCOPY  W/ POLYPECTOMY  2006   BENIGN ADENOMA    POLYPECTOMY     TONSILLECTOMY AND ADENOIDECTOMY      Family History  Problem Relation Age of Onset   Colon cancer Mother    Hypertension Mother    Diabetes Sister    Cancer Maternal Grandmother        OVARIAN   Ovarian cancer Maternal Grandmother    Colon polyps Neg Hx    Esophageal cancer Neg Hx    Rectal cancer Neg Hx    Stomach cancer Neg Hx     Social History   Tobacco Use   Smoking status: Never    Passive exposure: Never   Smokeless tobacco: Never  Substance Use Topics   Alcohol use: No    Alcohol/week: 0.0 standard drinks of alcohol    Subjective:   Presents for yearly CPE; needs to get pap smear and DEXA updated; continuing to work with her ophthalmologist regularly and pain management provider; Blood sugar is technically c/w diabetes but patient has deferred medication in the past;   Review of Systems  Constitutional: Negative.   HENT: Negative.    Eyes: Negative.   Respiratory: Negative.    Cardiovascular: Negative.   Gastrointestinal: Negative.   Genitourinary: Negative.   Musculoskeletal: Negative.  Negative for neck pain.  Skin: Negative.  Neurological: Negative.   Endo/Heme/Allergies: Negative.   Psychiatric/Behavioral: Negative.       Objective:  Vitals:   12/07/23 0848  BP: 128/78  Pulse: 61  Temp: 98.1 F (36.7 C)  TempSrc: Oral  SpO2: 96%  Weight: 155 lb 9.6 oz (70.6 kg)  Height: 5' (1.524 m)    General: Well developed, well nourished, in no acute distress  Skin : Warm and dry.  Head: Normocephalic and atraumatic  Eyes: Sclera and conjunctiva clear; pupils round and reactive to light; extraocular movements intact  Ears: External normal; canals clear; tympanic membranes normal  Oropharynx: Pink, supple. No suspicious lesions  Neck: Supple without thyromegaly, adenopathy  Lungs: Respirations unlabored; clear to auscultation bilaterally without wheeze, rales, rhonchi  CVS exam: normal rate  and regular rhythm.  Abdomen: Soft; nontender; nondistended; normoactive bowel sounds; no masses or hepatosplenomegaly  Musculoskeletal: No deformities; no active joint inflammation  Extremities: No edema, cyanosis, clubbing  Vessels: Symmetric bilaterally  Neurologic: Alert and oriented; speech intact; face symmetrical; moves all extremities well; CNII-XII intact without focal deficit  Pelvic exam- Pelvic exam: normal external genitalia, vulva, vagina, cervix, uterus and adnexa.   Assessment:  1. PE (physical exam), annual   2. Osteoporosis screening   3. Reactive airway disease with wheezing, mild intermittent, uncomplicated   4. Cervical cancer screening   5. Lipid screening   6. Elevated glucose   7. Vitamin D  deficiency     Plan:  Age appropriate preventive healthcare needs addressed; encouraged regular eye doctor and dental exams; encouraged regular exercise; will update labs and refills as needed today; follow-up in 1 year, sooner prn.    No follow-ups on file.  Orders Placed This Encounter  Procedures   DG Bone Density    Standing Status:   Future    Expiration Date:   12/06/2024    Reason for Exam (SYMPTOM  OR DIAGNOSIS REQUIRED):   ovarian failure; history of osteoporosis; osteoporosis screening    Preferred imaging location?:   [facility]   CBC with Differential/Platelet   Comp Met (CMET)   Lipid panel   Hemoglobin A1c   Vitamin D  (25 hydroxy)    Requested Prescriptions   Signed Prescriptions Disp Refills   amLODipine  (NORVASC ) 10 MG tablet 90 tablet 3    Sig: Take 1 tablet (10 mg total) by mouth daily.   atorvastatin  (LIPITOR) 10 MG tablet 90 tablet 3    Sig: Take 1 tablet (10 mg total) by mouth daily at 6 PM.   omeprazole  (PRILOSEC) 40 MG capsule 90 capsule 3    Sig: Take 1 capsule (40 mg total) by mouth daily.   spironolactone  (ALDACTONE ) 25 MG tablet 90 tablet 3    Sig: Take 1 tablet (25 mg total) by mouth daily.   albuterol  (VENTOLIN  HFA) 108 (90  Base) MCG/ACT inhaler 8.5 each 3    Sig: TAKE 2 PUFFS BY MOUTH EVERY 4 HOURS AS NEEDED FOR WHEEZE

## 2023-12-07 NOTE — Telephone Encounter (Signed)
Form has been placed in PCP folder.

## 2023-12-07 NOTE — Telephone Encounter (Signed)
 Pt brought forms that they forgot to give pcp during cpe. Placed in providers bin in [place]. Pt wants forms faxed.

## 2023-12-08 LAB — CYTOLOGY - PAP
Chlamydia: NEGATIVE
Comment: NEGATIVE
Comment: NEGATIVE
Comment: NORMAL
Diagnosis: NEGATIVE
High risk HPV: NEGATIVE
Neisseria Gonorrhea: NEGATIVE

## 2023-12-14 IMAGING — MG MM DIGITAL SCREENING BILAT W/ TOMO AND CAD
8 series · 8 of 24 positions shown · non-contrast
Comparison: Previous exam(s).

CLINICAL DATA: Screening.

EXAM:
DIGITAL SCREENING BILATERAL MAMMOGRAM WITH TOMOSYNTHESIS AND CAD
TECHNIQUE: Bilateral screening digital craniocaudal and mediolateral oblique
mammograms were obtained. Bilateral screening digital breast
tomosynthesis was performed. The images were evaluated with
computer-aided detection.

[R MLO synth-2D]
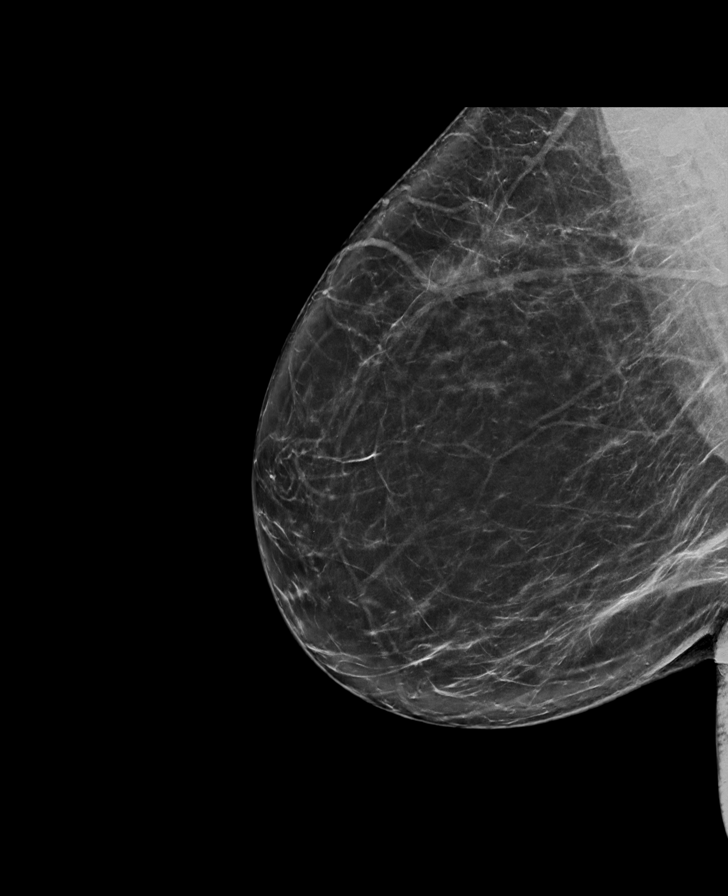

[R CC synth-2D]
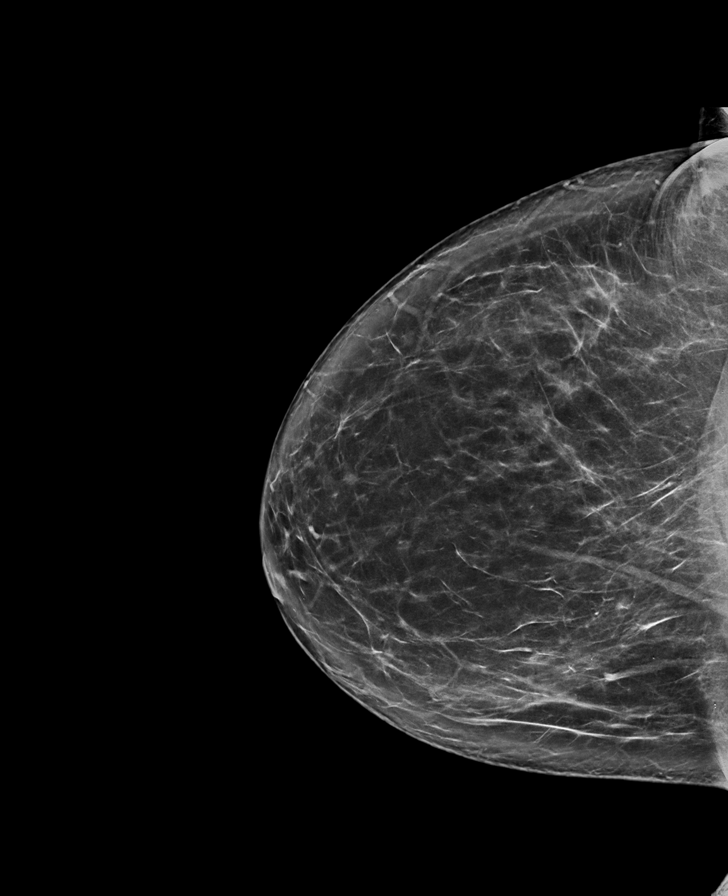

[L MLO synth-2D]
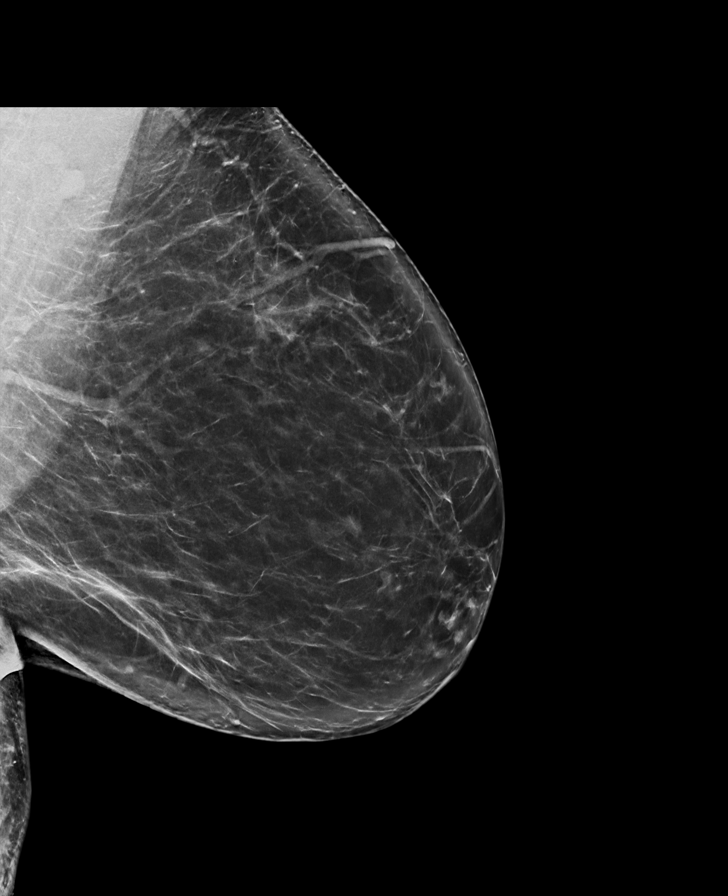

[L CC synth-2D]
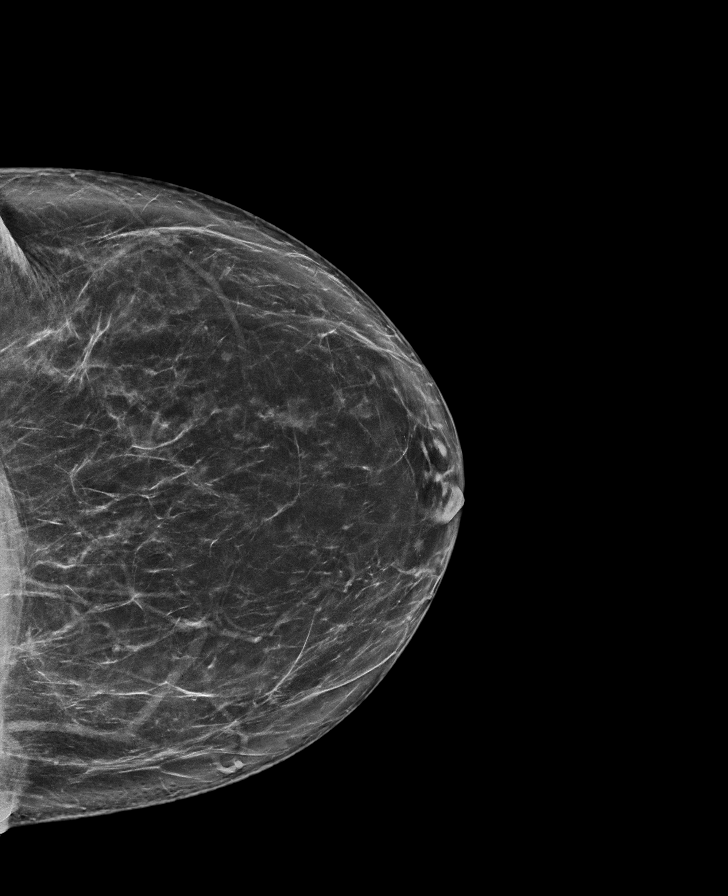

[L CC tomo · tomo slice 44/87.0]
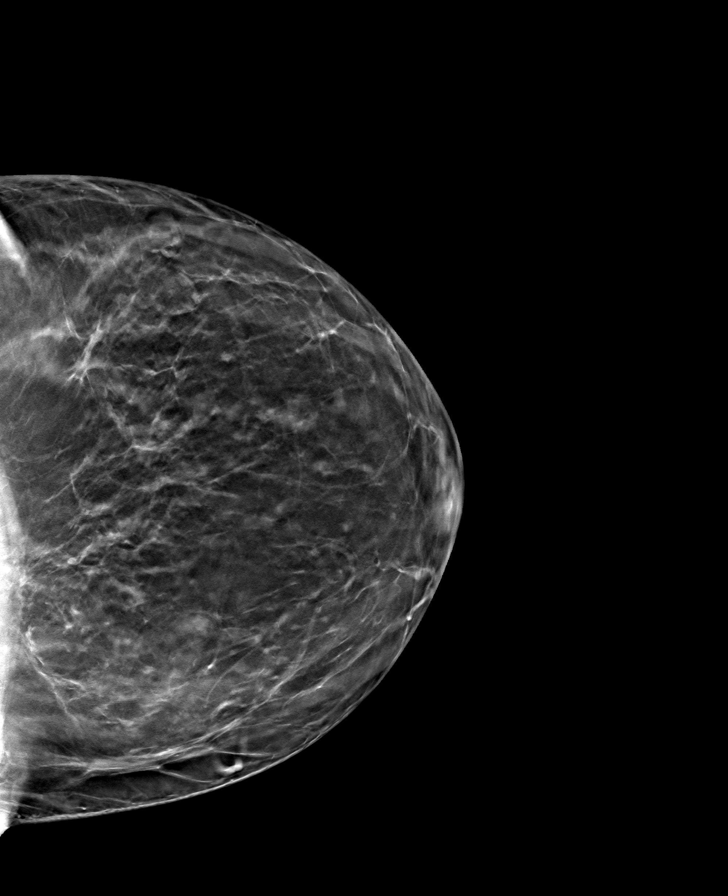

[R MLO tomo · tomo slice 45/88.0]
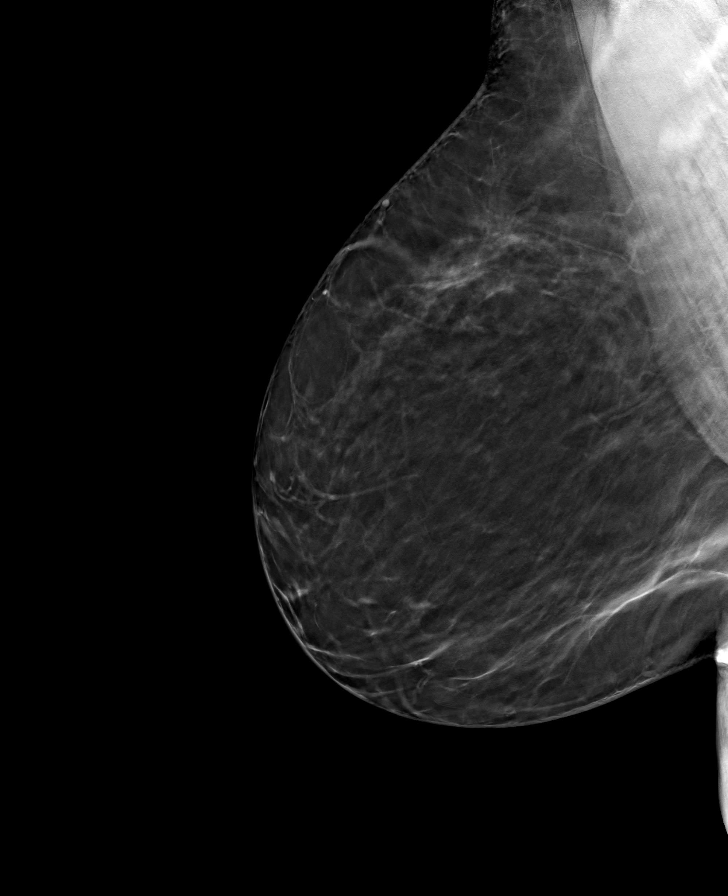

[R CC tomo · tomo slice 45/89.0]
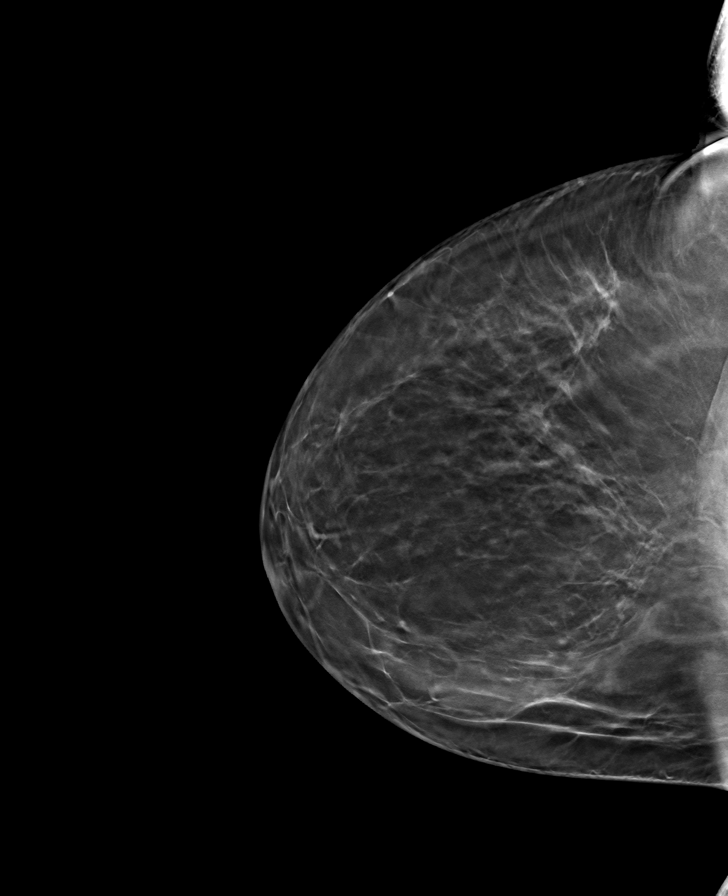

[L MLO tomo · tomo slice 45/90.0]
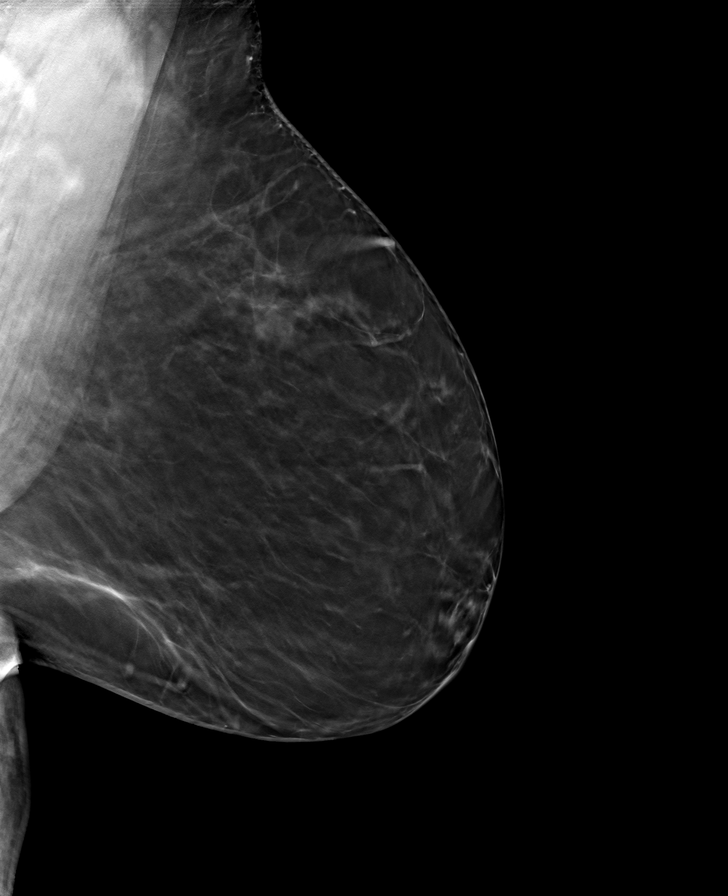

[8 of 24 positions shown; findings below may reference images not displayed]

ACR Breast Density Category b: There are scattered areas of
fibroglandular density.
FINDINGS: There are no findings suspicious for malignancy.
IMPRESSION: No mammographic evidence of malignancy. A result letter of this
screening mammogram will be mailed directly to the patient.

RECOMMENDATION:
Screening mammogram in one year. (Code:63-9-VFP)

BI-RADS CATEGORY  1: Negative.

*Please Insert Correct Screening Template

## 2023-12-15 ENCOUNTER — Other Ambulatory Visit: Payer: Self-pay | Admitting: Family

## 2024-01-20 ENCOUNTER — Other Ambulatory Visit (HOSPITAL_BASED_OUTPATIENT_CLINIC_OR_DEPARTMENT_OTHER)

## 2024-02-17 ENCOUNTER — Ambulatory Visit (HOSPITAL_BASED_OUTPATIENT_CLINIC_OR_DEPARTMENT_OTHER)
Admission: RE | Admit: 2024-02-17 | Discharge: 2024-02-17 | Disposition: A | Discharge status: Home / Self Care | Source: Ambulatory Visit | Attending: Family | Admitting: Family

## 2024-02-17 DIAGNOSIS — Z13820 Encounter for screening for osteoporosis: Secondary | ICD-10-CM | POA: Insufficient documentation

## 2024-02-21 ENCOUNTER — Encounter: Payer: Self-pay | Admitting: Radiology

## 2024-02-28 ENCOUNTER — Ambulatory Visit: Payer: Self-pay

## 2024-02-28 NOTE — Telephone Encounter (Signed)
 Appt scheduled

## 2024-02-28 NOTE — Telephone Encounter (Signed)
 FYI Only or Action Required?: FYI only for provider: appointment scheduled on 02/29/24.  Patient was last seen in primary care on 12/07/2023 by Jason Leita Repine, FNP.  Called Nurse Triage reporting Back Pain.  Symptoms began yesterday.  Interventions attempted: OTC medications: Extra strength ibuprofen.  Symptoms are: stable.  Triage Disposition: See PCP When Office is Open (Within 3 Days)  Patient/caregiver understands and will follow disposition?: Yes Reason for Disposition  [1] Age > 50 AND [2] no history of prior similar back pain  Answer Assessment - Initial Assessment Questions Extra strength ibuprofen not touching pain.   1. ONSET: When did the pain begin? (e.g., minutes, hours, days)     Yesterday  2. LOCATION: Where does it hurt? (upper, mid or lower back)     Lower back, right side  3. SEVERITY: How bad is the pain?  (e.g., Scale 1-10; mild, moderate, or severe)     8/10  4. PATTERN: Is the pain constant? (e.g., yes, no; constant, intermittent)      Constant when up and moving , knows its there when sitting   5. RADIATION: Does the pain shoot into your legs or somewhere else?     When up moving, radiates to front right thigh  6. CAUSE:  What do you think is causing the back pain?      Unsure thought maybe slept wrong or pulled something  7. BACK OVERUSE:  Any recent lifting of heavy objects, strenuous work or exercise?     Denies  8. MEDICINES: What have you taken so far for the pain? (e.g., nothing, acetaminophen , NSAIDS)     Extra strength ibuprofen  9. NEUROLOGIC SYMPTOMS: Do you have any weakness, numbness, or problems with bowel/bladder control?     Denies  10. OTHER SYMPTOMS: Do you have any other symptoms? (e.g., fever, abdomen pain, burning with urination, blood in urine)       Denies  Protocols used: Back Pain-A-AH  Copied from CRM #[id number]. Topic: Clinical - Red Word Triage >> Feb 28, 2024 11:35 AM Thersia BROCKS wrote: [employer] that prompted transfer to Nurse Triage: lower part of back is aching lower side of leg , its painful. when siting is fine but when getting up to move its hurting upper right thigh

## 2024-02-29 ENCOUNTER — Ambulatory Visit: Admitting: Family

## 2024-02-29 VITALS — BP 131/79 | HR 54 | Temp 97.7°F | Resp 16 | Ht 60.0 in | Wt 160.0 lb

## 2024-02-29 DIAGNOSIS — M5416 Radiculopathy, lumbar region: Secondary | ICD-10-CM | POA: Diagnosis not present

## 2024-02-29 MED ORDER — METHYLPREDNISOLONE 4 MG PO TBPK: ORAL_TABLET | ORAL | 0 refills | Status: AC

## 2024-02-29 NOTE — Progress Notes (Signed)
 5  Subjective:     Patient ID: Courtney Guzman, female    DOB: [birth year], 63 y.o.   MRN: [id number]  Chief Complaint  Patient presents with   Hip Pain    Patient complains of right hip    HPI  Discussed the use of AI scribe software for clinical note transcription with the patient, who gave verbal consent to proceed.  History of Present Illness Courtney Guzman is a 63 year old female with fibromyalgia who presents with right hip and thigh pain.  She has been experiencing pain in her right hip and thigh since Sunday, which began while sitting in church. The pain is described as 'really, really yucky' and worsens with standing or walking. It radiates down her thigh and is particularly noticeable in the morning. She has been taking extra strength ibuprofen, which provides only partial relief.  Her history of fibromyalgia typically affects her upper back, but she denies any new or worsening lower back pain. No bowel or bladder control issues are reported. The pain is worse with certain movements, such as turning in bed, which she finds painful and requires conscious effort.  She has been taking ibuprofen more frequently than usual to manage the pain, despite not being a 'medicine person'. She is concerned about the pain's impact on her daily activities, noting discomfort even during normal walking and activities at church.  Her past medical history includes a recent diagnosis of diabetes, which she is trying to control through diet. Her A1c was 6.7 at her last physical in August, and she was moved from a prediabetic to a diabetic diagnosis.      Health Maintenance Due  Topic Date Due   Pneumococcal Vaccine: 50+ Years (1 of 2 - PCV) Never done   Diabetic kidney evaluation - Urine ACR  08/18/2018   FOOT EXAM  11/01/2020   OPHTHALMOLOGY EXAM  03/27/2021   Influenza Vaccine  11/19/2023   COVID-19 Vaccine (4 - 2025-26 season) 12/20/2023    Past Medical History:  Diagnosis Date   Allergy     seasonal   Arthritis    Diabetes mellitus without complication (HCC)    Fibromyalgia    dr. eudelia   GERD (gastroesophageal reflux disease)    Hyperlipidemia    Hypertension    Wheezes    with season changes     Past Surgical History:  Procedure Laterality Date   CESAREAN SECTION  1993   with BTL   COLONOSCOPY  2017   Dr.Armbruster   COLONOSCOPY W/ POLYPECTOMY  2006   BENIGN ADENOMA    POLYPECTOMY     TONSILLECTOMY AND ADENOIDECTOMY      Family History  Problem Relation Age of Onset   Colon cancer Mother    Hypertension Mother    Diabetes Sister    Cancer Maternal Grandmother        OVARIAN   Ovarian cancer Maternal Grandmother    Colon polyps Neg Hx    Esophageal cancer Neg Hx    Rectal cancer Neg Hx    Stomach cancer Neg Hx     Social History   Socioeconomic History   Marital status: Married    Spouse name: Not on file   Number of children: Not on file   Years of education: Not on file   Highest education level: Not on file  Occupational History   Not on file  Tobacco Use   Smoking status: Never    Passive exposure: Never   Smokeless tobacco:  Never  Vaping Use   Vaping status: Never Used  Substance and Sexual Activity   Alcohol use: No    Alcohol/week: 0.0 standard drinks of alcohol   Drug use: No   Sexual activity: Yes    Partners: Male    Birth control/protection: Surgical    Comment: TUBAL LIGATION, 1st sexual encounter at 81, has had 5   Other Topics Concern   Not on file  Social History Narrative   Not on file   Social Drivers of Health   Financial Resource Strain: Not on file  Food Insecurity: Not on file  Transportation Needs: Not on file  Physical Activity: Not on file  Stress: Not on file  Social Connections: Not on file  Intimate Partner Violence: Not on file    Outpatient Medications Prior to Visit  Medication Sig Dispense Refill   albuterol  (VENTOLIN  HFA) 108 (90 Base) MCG/ACT inhaler TAKE 2 PUFFS BY MOUTH EVERY 4  HOURS AS NEEDED FOR WHEEZE 8.5 each 3   amLODipine  (NORVASC ) 10 MG tablet Take 1 tablet (10 mg total) by mouth daily. 90 tablet 3   atorvastatin  (LIPITOR) 10 MG tablet Take 1 tablet (10 mg total) by mouth daily at 6 PM. 90 tablet 3   COMBIGAN 0.2-0.5 % ophthalmic solution Apply 1 drop to eye 2 (two) times daily.     ferrous sulfate 325 (65 FE) MG tablet Take 325 mg by mouth daily with breakfast.     Omega-3 Fatty Acids (FISH OIL) 300 MG CAPS Fish Oil     omeprazole  (PRILOSEC) 40 MG capsule Take 1 capsule (40 mg total) by mouth daily. 90 capsule 3   spironolactone  (ALDACTONE ) 25 MG tablet Take 1 tablet (25 mg total) by mouth daily. 90 tablet 3   VYZULTA 0.024 % SOLN Place 0.024 % into both eyes at bedtime.     No facility-administered medications prior to visit.    Allergies  Allergen Reactions   Sulfa Antibiotics Other (See Comments)   Tylenol  [Acetaminophen ] Itching    ROS    See HPI Objective:    Physical Exam Constitutional:      General: She is not in acute distress.    Appearance: Normal appearance. She is well-developed.  HENT:     Head: Normocephalic and atraumatic.     Right Ear: External ear normal.     Left Ear: External ear normal.  Eyes:     General: No scleral icterus. Neck:     Thyroid: No thyromegaly.  Cardiovascular:     Rate and Rhythm: Normal rate and regular rhythm.     Heart sounds: Normal heart sounds. No murmur heard. Pulmonary:     Effort: Pulmonary effort is normal. No respiratory distress.     Breath sounds: Normal breath sounds. No wheezing.  Musculoskeletal:     Cervical back: Neck supple.     Comments: Pain right thigh noted with straight leg raise of left leg  Skin:    General: Skin is warm and dry.  Neurological:     Mental Status: She is alert and oriented to person, place, and time.     Deep Tendon Reflexes:     Reflex Scores:      Patellar reflexes are 3+ on the right side and 3+ on the left side.    Comments: Bilateral LE strength  is 5/5   Psychiatric:        Mood and Affect: Mood normal.        Behavior: Behavior normal.  Thought Content: Thought content normal.        Judgment: Judgment normal.      BP 131/79 (BP Location: Right Arm, Patient Position: Sitting, Cuff Size: Normal)   Pulse (!) 54   Temp 97.7 F (36.5 C) (Oral)   Resp 16   Ht 5' (1.524 m)   Wt 160 lb (72.6 kg)   LMP 02/24/2008   SpO2 (!) 9%   BMI 31.25 kg/m  Wt Readings from Last 3 Encounters:  02/29/24 160 lb (72.6 kg)  12/07/23 155 lb 9.6 oz (70.6 kg)  11/19/23 155 lb (70.3 kg)       Assessment & Plan:   Problem List Items Addressed This Visit       Unprioritized   Lumbar radiculopathy - Primary    Acute right-sided hip and thigh pain likely due to lumbar radiculopathy, possibly from nerve compression or pinching. Ibuprofen provided limited relief. MRI considered if symptoms persist post-conservative management. Steroids may elevate blood sugar but not dangerous with A1c of 6.7. - Prescribed Medrol Dosepak for one week. - Advised to contact via MyChart if symptoms persist after steroid course.        Relevant Medications   methylPREDNISolone (MEDROL DOSEPAK) 4 MG TBPK tablet   Assessment & Plan     I am having Dickey Silvan start on methylPREDNISolone. I am also having her maintain her ferrous sulfate, Combigan, Fish Oil, Vyzulta, amLODipine , atorvastatin , omeprazole , spironolactone , and albuterol .  Meds ordered this encounter  Medications   methylPREDNISolone (MEDROL DOSEPAK) 4 MG TBPK tablet    Sig: Take per package instructions    Dispense:  21 tablet    Refill:  0    Supervising Provider:   DOMENICA BLACKBIRD A [[id number]]

## 2024-02-29 NOTE — Patient Instructions (Signed)
  VISIT SUMMARY: Today, we discussed your right hip and thigh pain, which started recently and has been affecting your daily activities. We also reviewed your recent diabetes diagnosis and how it might be impacted by the new treatment plan.  YOUR PLAN: -LUMBAR RADICULOPATHY: Lumbar radiculopathy is a condition where a nerve in the lower back is pinched or compressed, causing pain that can radiate down the leg. To help manage your pain, I have prescribed a Medrol Dosepak for one week. If your symptoms do not improve after completing the steroid course, please contact me via MyChart. We may consider an MRI if there is no improvement with conservative management.  -TYPE 2 DIABETES MELLITUS: Type 2 diabetes is a condition where your body does not use insulin properly, leading to elevated blood sugar levels. Since steroids can raise blood sugar levels, it is important to monitor your blood sugar, especially after using the steroids. Your current A1c level of 6.7 is not dangerous, but we need to keep an eye on it.  INSTRUCTIONS: Please take the Medrol Dosepak as prescribed for one week. Monitor your blood sugar levels, especially after using the steroids. If your symptoms do not improve after completing the steroid course, contact me via MyChart. We may consider an MRI if there is no improvement with conservative management.                      Contains text generated by Abridge.                                 Contains text generated by Abridge.

## 2024-02-29 NOTE — Assessment & Plan Note (Signed)
  Acute right-sided hip and thigh pain likely due to lumbar radiculopathy, possibly from nerve compression or pinching. Ibuprofen provided limited relief. MRI considered if symptoms persist post-conservative management. Steroids may elevate blood sugar but not dangerous with A1c of 6.7. - Prescribed Medrol Dosepak for one week. - Advised to contact via MyChart if symptoms persist after steroid course.

## 2024-03-02 ENCOUNTER — Encounter: Payer: Self-pay | Admitting: Family

## 2024-03-02 NOTE — Telephone Encounter (Signed)
 Got her scheduled

## 2024-03-07 ENCOUNTER — Encounter: Payer: Self-pay | Admitting: Family

## 2024-03-07 DIAGNOSIS — M5416 Radiculopathy, lumbar region: Secondary | ICD-10-CM

## 2024-03-07 MED ORDER — GABAPENTIN 100 MG PO CAPS: 100.0000 mg | ORAL_CAPSULE | Freq: Three times a day (TID) | ORAL | 3 refills | Status: AC

## 2024-03-27 NOTE — Therapy (Signed)
 OUTPATIENT PHYSICAL THERAPY THORACOLUMBAR EVALUATION   Patient Name: Courtney Guzman MRN: [id number] DOB:1960/09/08, 63 y.o., female Today's Date: 03/27/2024  END OF SESSION:   Past Medical History:  Diagnosis Date   Allergy    seasonal   Arthritis    Diabetes mellitus without complication (HCC)    Fibromyalgia    dr. eudelia   GERD (gastroesophageal reflux disease)    Hyperlipidemia    Hypertension    Wheezes    with season changes    Past Surgical History:  Procedure Laterality Date   CESAREAN SECTION  1993   with BTL   COLONOSCOPY  2017   Dr.Armbruster   COLONOSCOPY W/ POLYPECTOMY  2006   BENIGN ADENOMA    POLYPECTOMY     TONSILLECTOMY AND ADENOIDECTOMY     Patient Active Problem List   Diagnosis Date Noted   Lumbar radiculopathy 02/29/2024   Osteoarthritis 10/30/2018   Iron deficiency anemia 10/30/2018   Essential hypertension 08/11/2016   Primary insomnia 07/01/2016   Type 2 diabetes mellitus without complication, without long-term current use of insulin (HCC) 06/14/2015   Hyperlipidemia 05/10/2014   Osteopenia 03/22/2012   GERD (gastroesophageal reflux disease)    Fibromyalgia syndrome 07/26/2008    PCP: Jason Leita Repine, FNP (Inactive)   REFERRING PROVIDER: Daryl Setter, NP   REFERRING DIAG: M54.16 (ICD-10-CM) - Lumbar radiculopathy  THERAPY DIAG:  No diagnosis found.  RATIONALE FOR EVALUATION AND TREATMENT: Rehabilitation  ONSET DATE: ***  NEXT MD VISIT: ***   SUBJECTIVE:                                                                                                                                                                                                         SUBJECTIVE STATEMENT: 63 y/o patient referred to PT from PCP for R hip and thigh pain  PAIN: Are you having pain? Yes: NPRS scale: *** Pain location: *** Pain description: *** Aggravating factors: *** Relieving factors: ***  PERTINENT HISTORY:  DM,  fibromyalgia, OA, HTN, osteopenia  PRECAUTIONS: None  RED FLAGS: {PT Red Flags:29287}  WEIGHT BEARING RESTRICTIONS: No  FALLS:  Has patient fallen in last 6 months? {fallsyesno:27318}  LIVING ENVIRONMENT: Lives with: {[facility] lives with:25569::lives with their family} Lives in: {Lives in:25570} Stairs: {opstairs:27293} Has following equipment at home: {Assistive devices:23999}  OCCUPATION: ***  PLOF: {PLOF:24004}  PATIENT GOALS: ***   OBJECTIVE: (objective measures completed at initial evaluation unless otherwise dated)  DIAGNOSTIC FINDINGS:  N/A  PATIENT SURVEYS:  Modified Oswestry:  MODIFIED OSWESTRY DISABILITY SCALE  Date: *** Score  Pain intensity {  ODI 1:32962}  2. Personal care (washing, dressing, etc.) {ODI 2:32963}  3. Lifting {ODI 3:32964}  4. Walking {ODI 4:32965}  5. Sitting {ODI 5:32966}  6. Standing {ODI 6:32967}  7. Sleeping {ODI 7:32968}  8. Social Life {ODI 8:32969}  9. Traveling {ODI 9:32970}  10. Employment/ Homemaking {ODI 10:32971}  Total ***/50   Interpretation of scores: Score Category Description  0-20% Minimal Disability The patient can cope with most living activities. Usually no treatment is indicated apart from advice on lifting, sitting and exercise  21-40% Moderate Disability The patient experiences more pain and difficulty with sitting, lifting and standing. Travel and social life are more difficult and they may be disabled from work. Personal care, sexual activity and sleeping are not grossly affected, and the patient can usually be managed by conservative means  41-60% Severe Disability Pain remains the main problem in this group, but activities of daily living are affected. These patients require a detailed investigation  61-80% Crippled Back pain impinges on all aspects of the patient's life. Positive intervention is required  81-100% Bed-bound These patients are either bed-bound or exaggerating their symptoms  Bluford FORBES Zoe DELENA Karon DELENA, et al. Surgery versus conservative management of stable thoracolumbar fracture: the PRESTO feasibility RCT. [place] ([place]): [employer]; 2021 Nov. [facility] Technology Assessment, No. 25.62.) Appendix 3, Oswestry Disability Index category descriptors. Available from: [email or web address].cz  Minimally Clinically Important Difference (MCID) = 12.8%  SCREENING FOR RED FLAGS: Bowel or bladder incontinence: {Yes/No:304960894} Spinal tumors: {Yes/No:304960894} Cauda equina syndrome: {Yes/No:304960894} Compression fracture: {Yes/No:304960894} Abdominal aneurysm: {Yes/No:304960894}  COGNITION:  Overall cognitive status: {cognition:24006}    SENSATION: {sensation:27233}  POSTURE:  {posture:25561}  PALPATION: ***  LUMBAR ROM:   Active  Eval  Flexion   Extension   Right lateral flexion   Left lateral flexion   Right rotation   Left rotation   (Blank rows = not tested)  MUSCLE LENGTH: Hamstrings: Right *** deg; Left *** deg Thomas test: Right *** deg; Left *** deg Hamstrings: *** ITB: *** Piriformis: *** Hip flexors: *** Quads: *** Heelcord: ***  LOWER EXTREMITY ROM:     Active  Right eval Left eval  Hip flexion    Hip extension    Hip abduction    Hip adduction    Hip internal rotation    Hip external rotation    Knee flexion    Knee extension    Ankle dorsiflexion    Ankle plantarflexion    Ankle inversion    Ankle eversion    (Blank rows = not tested)  LOWER EXTREMITY MMT:    MMT Right eval Left eval  Hip flexion    Hip extension    Hip abduction    Hip adduction    Hip internal rotation    Hip external rotation    Knee flexion    Knee extension    Ankle dorsiflexion    Ankle plantarflexion    Ankle inversion    Ankle eversion     (Blank rows = not tested)  LUMBAR SPECIAL TESTS:  {lumbar special test:25242}  FUNCTIONAL TESTS:  {Functional tests:24029}  GAIT: Distance walked: *** Assistive device  utilized: {Assistive devices:23999} Level of assistance: {Levels of assistance:24026} Gait pattern: {gait characteristics:25376} Comments: ***   TODAY'S TREATMENT:   SELF CARE: Provided education on PT POC progression., initil HEP    PATIENT EDUCATION:  Education details: PT eval findings, anticipated POC, and initial HEP  Person educated: Patient Education method: Explanation, Demonstration, Verbal cues, Tactile cues,  Handouts, and [employer] app access provided Education comprehension: verbalized understanding, verbal cues required, tactile cues required, and needs further education  HOME EXERCISE PROGRAM: ***   ASSESSMENT:  CLINICAL IMPRESSION: Roseanne Juenger is a 63 y.o. female who was referred to physical therapy for evaluation and treatment for M54.16 (ICD-10-CM) - Lumbar radiculopathy.     Patient reports onset of *** pain beginning ***. Pain is worse with ***.  Patient has deficits in *** ROM, *** LE flexibility, *** strength, abnormal posture, and TTP with abnormal muscle tension *** which are interfering with ADLs and are impacting quality of life.  On Modified Oswestry patient scored ***/50 demonstrating ***% or *** disability.  Briyana will benefit from skilled PT to address above deficits to improve mobility and activity tolerance with decreased pain interference.     OBJECTIVE IMPAIRMENTS: {opptimpairments:25111}.   ACTIVITY LIMITATIONS: {activitylimitations:27494}  PARTICIPATION LIMITATIONS: {participationrestrictions:25113}  PERSONAL FACTORS: Age, Fitness, and 1-2 comorbidities: DM, fibromyalgia, HTN are also affecting patient's functional outcome.   REHAB POTENTIAL: Good  CLINICAL DECISION MAKING: Evolving/moderate complexity  EVALUATION COMPLEXITY: Moderate   GOALS: Goals reviewed with patient? Yes  SHORT TERM GOALS: Target date: ***  Patient will be independent with initial HEP to improve outcomes and carryover.  Baseline: 100% PT assist required for  correct completion Goal status: INITIAL  2.  Patient will report 25% improvement in low back pain to improve QOL. Baseline:  Goal status: INITIAL  3.  *** Baseline:  Goal status: {GOALSTATUS:25110}   LONG TERM GOALS: Target date: ***  Patient will be independent with ongoing/advanced HEP for self-management at home.  Baseline: no advanced HEP yet Goal status: INITIAL  2.  Patient will report 50-75% improvement in low back pain to improve QOL.  Baseline: *** Goal status: INITIAL  3.  Patient to demonstrate ability to achieve and maintain good spinal alignment/posturing and body mechanics needed for daily activities. Baseline: *** Goal status: INITIAL  4.  Patient will demonstrate full pain free lumbar ROM to perform ADLs.   Baseline: Refer to above lumbar ROM table Goal status: INITIAL  5.  Patient will demonstrate improved BLE strength to >/= 5/5 for improved stability and ease of mobility. Baseline: Refer to above LE MMT table Goal status: INITIAL  6. Patient will report </= ***% on Modified Oswestry (MCID = 12%) to demonstrate improved functional ability with decreased pain interference. Baseline: *** Goal status: INITIAL  7.  Patient will tolerate *** min of (standing/sitting/walking) w/o increased pain to allow for *** improved mobility and activity tolerance. Baseline: *** Goal status: INITIAL  8.  Patient will report centralization of radicular symptoms.  Baseline: *** Goal status: INITIAL  9.  Patient will ***.  Baseline: *** Goal status: {GOALSTATUS:25110}   10.  *** Baseline: *** Goal status: {GOALSTATUS:25110}    PLAN:  PT FREQUENCY: 1-2x/week  PT DURATION: 8 weeks  PLANNED INTERVENTIONS: 97164- PT Re-evaluation, 97110-Therapeutic exercises, 97530- Therapeutic activity, [id number]- Neuromuscular re-education, 97535- Self Care, 02859- Manual therapy, [id number]- Electrical stimulation (unattended), 97016- Vasopneumatic device, [id number]- Ultrasound, [id number]-  Traction (mechanical), [id number]- Ionotophoresis 4mg /ml Dexamethasone, Patient/Family education, Taping, Joint mobilization, and Spinal mobilization  PLAN FOR NEXT SESSION: PIERRETTE RED SENIOR, PT 03/27/2024, 8:50 PM

## 2024-03-29 ENCOUNTER — Other Ambulatory Visit: Payer: Self-pay

## 2024-03-29 ENCOUNTER — Encounter: Payer: Self-pay | Admitting: Family

## 2024-03-29 ENCOUNTER — Encounter: Payer: Self-pay | Admitting: Rehabilitation

## 2024-03-29 ENCOUNTER — Ambulatory Visit: Admitting: Rehabilitation

## 2024-03-29 DIAGNOSIS — M6281 Muscle weakness (generalized): Secondary | ICD-10-CM | POA: Diagnosis present

## 2024-03-29 DIAGNOSIS — R262 Difficulty in walking, not elsewhere classified: Secondary | ICD-10-CM | POA: Insufficient documentation

## 2024-03-29 DIAGNOSIS — M5459 Other low back pain: Secondary | ICD-10-CM | POA: Insufficient documentation

## 2024-03-29 DIAGNOSIS — M5416 Radiculopathy, lumbar region: Secondary | ICD-10-CM | POA: Diagnosis not present

## 2024-03-29 DIAGNOSIS — R293 Abnormal posture: Secondary | ICD-10-CM | POA: Diagnosis present

## 2024-03-29 DIAGNOSIS — R600 Localized edema: Secondary | ICD-10-CM | POA: Diagnosis present

## 2024-04-05 ENCOUNTER — Ambulatory Visit: Admitting: Rehabilitation

## 2024-04-05 DIAGNOSIS — M5459 Other low back pain: Secondary | ICD-10-CM | POA: Diagnosis not present

## 2024-04-05 DIAGNOSIS — M6281 Muscle weakness (generalized): Secondary | ICD-10-CM

## 2024-04-05 DIAGNOSIS — R600 Localized edema: Secondary | ICD-10-CM

## 2024-04-05 DIAGNOSIS — R262 Difficulty in walking, not elsewhere classified: Secondary | ICD-10-CM

## 2024-04-05 DIAGNOSIS — R293 Abnormal posture: Secondary | ICD-10-CM

## 2024-04-05 NOTE — Therapy (Signed)
 OUTPATIENT PHYSICAL THERAPY THORACOLUMBAR TREATMENT   Patient Name: Courtney Guzman MRN: [id number] DOB:April 28, 1960, 63 y.o., female Today's Date: 04/05/2024  END OF SESSION:  PT End of Session - 04/05/24 0912     Visit Number 2    Date for Recertification  05/24/24    PT Start Time 0848    PT Stop Time 0942    PT Time Calculation (min) 54 min    Activity Tolerance Patient tolerated treatment well    Behavior During Therapy [facility] for tasks assessed/performed          Past Medical History:  Diagnosis Date   Allergy    seasonal   Arthritis    Diabetes mellitus without complication (HCC)    Fibromyalgia    dr. eudelia   GERD (gastroesophageal reflux disease)    Hyperlipidemia    Hypertension    Wheezes    with season changes    Past Surgical History:  Procedure Laterality Date   CESAREAN SECTION  1993   with BTL   COLONOSCOPY  2017   Dr.Armbruster   COLONOSCOPY W/ POLYPECTOMY  2006   BENIGN ADENOMA    POLYPECTOMY     TONSILLECTOMY AND ADENOIDECTOMY     Patient Active Problem List   Diagnosis Date Noted   Lumbar radiculopathy 02/29/2024   Osteoarthritis 10/30/2018   Iron deficiency anemia 10/30/2018   Essential hypertension 08/11/2016   Primary insomnia 07/01/2016   Type 2 diabetes mellitus without complication, without long-term current use of insulin (HCC) 06/14/2015   Hyperlipidemia 05/10/2014   Osteopenia 03/22/2012   GERD (gastroesophageal reflux disease)    Fibromyalgia syndrome 07/26/2008    PCP: Jason Leita Repine, FNP (Inactive)   REFERRING PROVIDER: Daryl Setter, NP   REFERRING DIAG: M54.16 (ICD-10-CM) - Lumbar radiculopathy  THERAPY DIAG:  Other low back pain  Muscle weakness (generalized)  Abnormal posture  Difficulty in walking, not elsewhere classified  Localized edema  RATIONALE FOR EVALUATION AND TREATMENT: Rehabilitation  ONSET DATE: 02/26/24  NEXT MD VISIT:    SUBJECTIVE:                                                                                                                                                                                                          SUBJECTIVE STATEMENT: Pt reports her pain is better today, sometimes when she wakes up she notes burning R ant thigh ~2sec at a time.    63 y/o patient referred to PT from PCP for R hip and thigh pain.   Patient states was sitting in church on 11/9  and started having R lateral lower back pain with radiation into the anterior R thigh for no apparent reason.  Pain did not get any better when she stood up.   She went the following week to PCP and got tapered dose prednisone and meloxicam that has not provided any relief.  She was then started on neurontin  and has seen some improvement.   Pain has been fairly constant, but is somewhat improved since initial onset.  The pain is worse now in the anterior R thigh than it is in the R low back.   Initially she was unable to lie on her stomach to sleep which she normally does.   Rolling to the R ( L lumbar rotation) was extremely painful for her and she could only do R rotation lying on the L side.   That has improved some.    Patient was very active prior to this pain started walking 4 days weekly, doing aerobics (some step and some flat).   She tries to keep fairly active to manage her fibromyalgia.   She has been unable to do this since the injury due to the pain which is very frustrating.  She uses a TENS unit and cervical home traction unit for her cervical fibromyalgia pain.  PAIN: Are you having pain? Yes: NPRS scale: 4/10 now;  6/10 worst Pain location: R anterior thigh Pain description: dull achy, occasional burning sensation Aggravating factors: leaning back Relieving factors: leaning over to the R or lying on her R side  PERTINENT HISTORY:  DM, fibromyalgia, OA, HTN, osteopenia  PRECAUTIONS: None  RED FLAGS: None  WEIGHT BEARING RESTRICTIONS: No  FALLS:  Has patient fallen in last 6  months? No  LIVING ENVIRONMENT: Lives with: lives with their family and lives with their spouse Lives in: House/apartment Stairs: Yes: Internal: 1 flight steps; on right going up Has following equipment at home: None  OCCUPATION: retired from [employer] and was up and down a lot, but no heavy lifting  PLOF: Independent with gait  PATIENT GOALS: to return to her normal activity without pain   OBJECTIVE: (objective measures completed at initial evaluation unless otherwise dated)  DIAGNOSTIC FINDINGS:  N/A  PATIENT SURVEYS:  Modified Oswestry:  MODIFIED OSWESTRY DISABILITY SCALE  Date: 03/29/24 Score  Pain intensity 0 = I can tolerate the pain I have without having to use pain medication.  2. Personal care (washing, dressing, etc.) 0 =  I can take care of myself normally without causing increased pain.  3. Lifting 3 = Pain prevents me from lifting heavy weights, but I can manage light to medium weights if they are conveniently positioned  4. Walking 1 = Pain prevents me from walking more than 1 mile.  5. Sitting 1 =  I can only sit in my favorite chair as long as I like.  6. Standing 1 =  I can stand as long as I want but, it increases my pain.  7. Sleeping 2 =  Even when I take pain medication, I sleep less than 6 hours  8. Social Life 2 = Pain prevents me from participating in more energetic activities (eg. sports, dancing).  9. Traveling 1 =  I can travel anywhere, but it increases my pain.  10. Employment/ Homemaking 1 = My normal homemaking/job activities increase my pain, but I can still perform all that is required of me  Total 12/50   Interpretation of scores: Score Category Description  0-20% Minimal Disability  The patient can cope with most living activities. Usually no treatment is indicated apart from advice on lifting, sitting and exercise  21-40% Moderate Disability The patient experiences more pain and difficulty with sitting, lifting and  standing. Travel and social life are more difficult and they may be disabled from work. Personal care, sexual activity and sleeping are not grossly affected, and the patient can usually be managed by conservative means  41-60% Severe Disability Pain remains the main problem in this group, but activities of daily living are affected. These patients require a detailed investigation  61-80% Crippled Back pain impinges on all aspects of the patients life. Positive intervention is required  81-100% Bed-bound These patients are either bed-bound or exaggerating their symptoms  Bluford FORBES Zoe DELENA Karon DELENA, et al. Surgery versus conservative management of stable thoracolumbar fracture: the PRESTO feasibility RCT. [place] ([place]): [employer]; 2021 Nov. [facility] Technology Assessment, No. 25.62.) Appendix 3, Oswestry Disability Index category descriptors. Available from: Findjewelers.cz  Minimally Clinically Important Difference (MCID) = 12.8%  SCREENING FOR RED FLAGS: Bowel or bladder incontinence: No Spinal tumors: No Cauda equina syndrome: No Compression fracture: No Abdominal aneurysm: No  COGNITION:  Overall cognitive status: Within functional limits for tasks assessed    SENSATION: Abnormal sensations over the R anterior thigh  POSTURE:  Increased anterior pelvic tilt, R lateral shift, leans to the R with forward and backward flexion ROM testing  PALPATION: TTP over the R lower back and R anterior thigh  LUMBAR ROM:   Active  Eval  Flexion To knees, no worse  Extension 75%, some increase  Right lateral flexion Below knee  Left lateral flexion Below knee, increased pain  Right rotation 100%, increased pain  Left rotation 100%   (Blank rows = not tested)  MUSCLE LENGTH: Hamstrings: Right SLR = 75 deg; Left SLR =80  deg Thomas test: Right knee flexion = 100 deg; Left NT deg Hamstrings: mild tight BLE ITB: not tight Piriformis: not  tight Hip flexors: not tight Quads: WNL Heelcord: NT  LOWER EXTREMITY ROM:     Active  Right eval Left eval  Hip flexion    Hip extension    Hip abduction    Hip adduction    Hip internal rotation    Hip external rotation    Knee flexion    Knee extension    Ankle dorsiflexion    Ankle plantarflexion    Ankle inversion    Ankle eversion    (Blank rows = not tested)  LOWER EXTREMITY MMT:    MMT Right eval Left eval  Hip flexion    Hip extension    Hip abduction    Hip adduction    Hip internal rotation    Hip external rotation    Knee flexion    Knee extension    Ankle dorsiflexion    Ankle plantarflexion    Ankle inversion    Ankle eversion     (Blank rows = not tested)  LUMBAR SPECIAL TESTS:  Straight leg raise test: + on contralateral side only, Slump test: Positive, SI Compression/distraction test: Negative, and FABER test: Negative L SLR increases R LBP and and R thigh paresthesias Repeated flexion no change, repeated extension is worse, repeated L SB is worse, Repeated R SB is no change to symptoms SKTC on the LLE increases pain SI tests are non provoking  FUNCTIONAL TESTS:  TBD  GAIT: Distance walked: into clinic x 150' Assistive device utilized: None Level of assistance:  Complete Independence Gait pattern: R lateral shift/lean Comments:    TODAY'S TREATMENT:  04/05/24 Therapeutic Exercise:  Nustep L2x59min UE/LE Standing trunk rotation holding onto ladder x 10 B R lateral Shift correction at wall  R shld abd and lateral shift at wall Hooklying LTR going to L 2 x 10; x 10 R side Reviewed medbridge go app with patient, show how to use it and access home program  Neuromuscular Re-Ed: Standing hip extension x 10 B- UE support Standing hip abduction x 10 B- UE support Hooklying TRA sets 10x3 sec; 2 sets Bridges 2x10 Prone R knee bend 2x10 Seated clamshells GTB x 20 S/L clamshells RTB x 10 BLE R long leg distraction 3x with oscillations - to  reduce radicular symptoms  SELF CARE: Provided education on PT POC progression., initil HEP--advised to only rock knees to the left with LTR HEP    PATIENT EDUCATION:  Education details: HEP update, reviewed medbridge app with patient Person educated: Patient Education method: Explanation, Demonstration, Verbal cues, Tactile cues, Handouts, and MedBridgeGO app access provided Education comprehension: verbalized understanding, verbal cues required, tactile cues required, and needs further education  HOME EXERCISE PROGRAM: Access Code: [email or web address] URL: [email or web address] Date: 04/05/2024 Prepared by: Aydenn Gervin  Exercises - Supine Lower Trunk Rotation  - 1 x daily - 7 x weekly - 1 sets - 10 reps - Right Standing Lateral Shift Correction at Wall - Hold  - 1 x daily - 7 x weekly - 1 sets - 10 reps - Standing Shoulder Abduction Slides at Wall  - 1 x daily - 7 x weekly - 1 sets - 10 reps - Supine Bridge  - 1 x daily - 7 x weekly - 3 sets - 10 reps - Clamshell with Resistance  - 1 x daily - 7 x weekly - 3 sets - 10 reps   ASSESSMENT:  CLINICAL IMPRESSION: Pt responded well to treatment. Progressed with lumbar flexibility and strengthening and core activation. She was able to go to R side with LTR w/o pain, just reports stretch. Less radicular symptoms reported today in RLE.  Liesl Simons is a 64 y.o. female who was referred to physical therapy for evaluation and treatment for M54.16 (ICD-10-CM) - Lumbar radiculopathy.     Patient reports onset of low back and R anterior thigh pain beginning 02/26/24 for no apparent reason while sitting in church.  She is feeling better now than she initially did, is having more pain in the R anterior thigh than she originally did and less in the back.   Pain is worse with back extension and leaning to the L, so she leans to the R to decrease the pain.  Patient has deficits in lumbar ROM, BLE strength, abnormal posture, difficulty walking, and  pain which are interfering with ADLs and are impacting quality of life.  On Modified Oswestry patient scored 12/50 demonstrating 24% or mild disability.  Joclyn will benefit from skilled PT to address above deficits to improve mobility and activity tolerance with decreased pain interference.     OBJECTIVE IMPAIRMENTS: difficulty walking, decreased ROM, decreased strength, impaired sensation, postural dysfunction, and pain.   ACTIVITY LIMITATIONS: carrying, lifting, bending, sleeping, and locomotion level  PARTICIPATION LIMITATIONS: cleaning, laundry, and community activity  PERSONAL FACTORS: Age, Fitness, and 1-2 comorbidities: DM, fibromyalgia, HTN are also affecting patient's functional outcome.   REHAB POTENTIAL: Good  CLINICAL DECISION MAKING: Evolving/moderate complexity  EVALUATION COMPLEXITY: Moderate   GOALS: Goals reviewed with patient? Yes  SHORT TERM GOALS:  Target date: 04/26/2024   Patient will be independent with initial HEP to improve outcomes and carryover.  Baseline: 100% PT assist required for correct completion Goal status: INITIAL  2.  Patient will report 25% improvement in low back pain to improve QOL. Baseline:  Goal status: INITIAL   LONG TERM GOALS: Target date: 05/24/2024   Patient will be independent with ongoing/advanced HEP for self-management at home.  Baseline: no advanced HEP yet Goal status: INITIAL  2.  Patient will report 50-75% improvement in low back pain to improve QOL.  Baseline: 6/10 Goal status: INITIAL  3.  Patient to demonstrate ability to achieve and maintain good spinal alignment/posturing and body mechanics needed for daily activities. Baseline: R lateral shift Goal status: INITIAL  4.  Patient will demonstrate full pain free lumbar ROM to perform ADLs.   Baseline: Refer to above lumbar ROM table Goal status: INITIAL  5.  Patient will demonstrate improved BLE strength to >/= 5/5 for improved stability and ease of  mobility. Baseline: Refer to above LE MMT table Goal status: INITIAL  6. Patient will report </= 12% on Modified Oswestry (MCID = 12%) to demonstrate improved functional ability with decreased pain interference. Baseline: 24% Goal status: INITIAL  7.  Patient will tolerate 30 min of (standing/sitting/walking) w/o increased pain to allow for  improved mobility and activity tolerance. Baseline: can do it , but is in pain Goal status: INITIAL  8.  Patient will report centralization of radicular symptoms.  Baseline: anterior thigh paresthesia Goal status: INITIAL   PLAN:  PT FREQUENCY: 1-2x/week  PT DURATION: 8 weeks  PLANNED INTERVENTIONS: 97164- PT Re-evaluation, 97110-Therapeutic exercises, 97530- Therapeutic activity, 97112- Neuromuscular re-education, 97535- Self Care, 02859- Manual therapy, [id number]- Electrical stimulation (unattended), 97016- Vasopneumatic device, [id number]- Ultrasound, 02987- Traction (mechanical), [id number]- Ionotophoresis 4mg /ml Dexamethasone, Patient/Family education, Taping, Joint mobilization, and Spinal mobilization  PLAN FOR NEXT SESSION: See how HEP is doing,  continue with repeated movements of the spine into positions of relief, might try some extension with R pelvis sidegliding, consider traction either w/ sheet manually or machine, bridging, R trunk rotation   Shashank Kwasnik L Lella Mullany, PTA 04/05/2024, 9:43 AM

## 2024-04-11 ENCOUNTER — Telehealth: Payer: Self-pay | Admitting: Family

## 2024-04-11 ENCOUNTER — Encounter: Payer: Self-pay | Admitting: Family

## 2024-04-11 ENCOUNTER — Ambulatory Visit: Admitting: Family

## 2024-04-11 ENCOUNTER — Ambulatory Visit

## 2024-04-11 VITALS — BP 139/82 | HR 67 | Temp 97.9°F | Resp 16 | Ht 60.0 in | Wt 162.0 lb

## 2024-04-11 DIAGNOSIS — M8580 Other specified disorders of bone density and structure, unspecified site: Secondary | ICD-10-CM | POA: Diagnosis not present

## 2024-04-11 DIAGNOSIS — E7800 Pure hypercholesterolemia, unspecified: Secondary | ICD-10-CM | POA: Diagnosis not present

## 2024-04-11 DIAGNOSIS — M5416 Radiculopathy, lumbar region: Secondary | ICD-10-CM

## 2024-04-11 DIAGNOSIS — M5459 Other low back pain: Secondary | ICD-10-CM

## 2024-04-11 DIAGNOSIS — E119 Type 2 diabetes mellitus without complications: Secondary | ICD-10-CM

## 2024-04-11 DIAGNOSIS — F5101 Primary insomnia: Secondary | ICD-10-CM | POA: Diagnosis not present

## 2024-04-11 DIAGNOSIS — K219 Gastro-esophageal reflux disease without esophagitis: Secondary | ICD-10-CM

## 2024-04-11 DIAGNOSIS — M797 Fibromyalgia: Secondary | ICD-10-CM | POA: Diagnosis not present

## 2024-04-11 DIAGNOSIS — I10 Essential (primary) hypertension: Secondary | ICD-10-CM

## 2024-04-11 DIAGNOSIS — M6281 Muscle weakness (generalized): Secondary | ICD-10-CM

## 2024-04-11 DIAGNOSIS — R262 Difficulty in walking, not elsewhere classified: Secondary | ICD-10-CM

## 2024-04-11 DIAGNOSIS — R293 Abnormal posture: Secondary | ICD-10-CM

## 2024-04-11 LAB — MICROALBUMIN / CREATININE URINE RATIO
Creatinine,U: 16.3 mg/dL
Microalb Creat Ratio: UNDETERMINED mg/g (ref 0.0–30.0)
Microalb, Ur: <0.7 mg/dL

## 2024-04-11 LAB — HEMOGLOBIN A1C: Hgb A1c MFr Bld: 6.4 % (ref 4.6–6.5)

## 2024-04-11 MED ORDER — CALCIUM CARB-CHOLECALCIFEROL 600-20 MG-MCG PO TABS: 1.0000 | ORAL_TABLET | Freq: Two times a day (BID) | ORAL | Status: AC

## 2024-04-11 NOTE — Assessment & Plan Note (Addendum)
 Lab Results  Component Value Date   CHOL 173 12/07/2023   HDL 70.70 12/07/2023   LDLCALC 90 12/07/2023   TRIG 61.0 12/07/2023   CHOLHDL 2 12/07/2023   LDL goal <70, update lipids.  Continue atorvastatin . Consider increasing statin if still above goal.

## 2024-04-11 NOTE — Assessment & Plan Note (Signed)
 BP acceptable on amlodipine /aldactone , continue same.

## 2024-04-11 NOTE — Assessment & Plan Note (Signed)
 She reports that she has tried to wean off of omeprazole  but did not tolerate.  Continue omeprazole .

## 2024-04-11 NOTE — Assessment & Plan Note (Signed)
 Improved with PT/steroids, continue gabapentin  prn.

## 2024-04-11 NOTE — Telephone Encounter (Signed)
 Request faxed

## 2024-04-11 NOTE — Assessment & Plan Note (Signed)
 Generally sleeping well as long as she is not in pain.

## 2024-04-11 NOTE — Progress Notes (Signed)
 "  Subjective:     Patient ID: Courtney Guzman, female    DOB: [birth year], 63 y.o.   MRN: [id number]  Chief Complaint  Patient presents with   Transitions Of Care    HPI  Discussed the use of AI scribe software for clinical note transcription with the patient, who gave verbal consent to proceed.  History of Present Illness Courtney Guzman is a 63 year old female with fibromyalgia and hypertension who presents for follow-up of right hip and thigh pain.  She has been experiencing persistent right hip and thigh pain since November. Initial treatment with steroid medication was ineffective. She has found more effective pain management using heat and a TENS unit. Gabapentin  was started and significantly reduced the pain, allowing her to begin physical therapy two weeks later. Currently, she experiences a pulling sensation rather than pain and is concerned about resuming physical activity.  Her fibromyalgia is managed with a TENS unit and patches applied by her husband. She has not tried Cymbalta for pain management. Her fibromyalgia pain is localized, and she uses patches and the TENS unit as needed.  She takes amlodipine  10 mg and spironolactone  daily for hypertension. She also takes omeprazole  daily for reflux, as attempts to wean off result in burning sensations. She is on Lipitor for cholesterol management, with her last LDL reading at 90, which is above the target for diabetics.  Her family history includes her mother, who had a mass on her right kidney removed in October 2022. She is concerned about her own kidney function, but her recent tests show normal kidney function.  She is retired from the [employer] and enjoys reading, watching TV, and going to the beach. She has four children and seven grandchildren, with one daughter in the [employer]. She has been retired since 2020.      Health Maintenance Due  Topic Date Due   Pneumococcal Vaccine: 50+ Years (1 of 2 - PCV) Never done    Diabetic kidney evaluation - Urine ACR  08/18/2018   OPHTHALMOLOGY EXAM  03/27/2021   COVID-19 Vaccine (4 - 2025-26 season) 12/20/2023    Past Medical History:  Diagnosis Date   Allergy    seasonal   Arthritis    Diabetes mellitus without complication (HCC)    Fibromyalgia    dr. eudelia   GERD (gastroesophageal reflux disease)    Hyperlipidemia    Hypertension    Wheezes    with season changes     Past Surgical History:  Procedure Laterality Date   CESAREAN SECTION  1993   with BTL   COLONOSCOPY  2017   Dr.Armbruster   COLONOSCOPY W/ POLYPECTOMY  2006   BENIGN ADENOMA    POLYPECTOMY     TONSILLECTOMY AND ADENOIDECTOMY      Family History  Problem Relation Age of Onset   Colon cancer Mother    Hypertension Mother    Kidney cancer Mother    Diabetes Sister    Cancer Maternal Grandmother        OVARIAN   Ovarian cancer Maternal Grandmother    Colon polyps Neg Hx    Esophageal cancer Neg Hx    Rectal cancer Neg Hx    Stomach cancer Neg Hx     Social History   Socioeconomic History   Marital status: Married    Spouse name: Not on file   Number of children: Not on file   Years of education: Not on file   Highest education level: Not  on file  Occupational History   Not on file  Tobacco Use   Smoking status: Never    Passive exposure: Never   Smokeless tobacco: Never  Vaping Use   Vaping status: Never Used  Substance and Sexual Activity   Alcohol use: No    Alcohol/week: 0.0 standard drinks of alcohol   Drug use: No   Sexual activity: Yes    Partners: Male    Birth control/protection: Surgical    Comment: TUBAL LIGATION, 1st sexual encounter at 55, has had 5   Other Topics Concern   Not on file  Social History Narrative   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    Social Drivers  of Health   Tobacco Use: Low Risk (04/11/2024)   Patient History    Smoking Tobacco Use: Never    Smokeless Tobacco Use: Never    Passive Exposure: Never  Financial Resource Strain: Not on file  Food Insecurity: Not on file  Transportation Needs: Not on file  Physical Activity: Not on file  Stress: Not on file  Social Connections: Not on file  Intimate Partner Violence: Not on file  Depression (PHQ2-9): Low Risk (02/29/2024)   Depression (PHQ2-9)    PHQ-2 Score: 2  Alcohol Screen: Not on file  Housing: Not on file  Utilities: Not on file  Health Literacy: Not on file    Outpatient Medications Prior to Visit  Medication Sig Dispense Refill   albuterol  (VENTOLIN  HFA) 108 (90 Base) MCG/ACT inhaler TAKE 2 PUFFS BY MOUTH EVERY 4 HOURS AS NEEDED FOR WHEEZE 8.5 each 3   amLODipine  (NORVASC ) 10 MG tablet Take 1 tablet (10 mg total) by mouth daily. 90 tablet 3   atorvastatin  (LIPITOR) 10 MG tablet Take 1 tablet (10 mg total) by mouth daily at 6 PM. 90 tablet 3   COMBIGAN 0.2-0.5 % ophthalmic solution Apply 1 drop to eye 2 (two) times daily.     ferrous sulfate 325 (65 FE) MG tablet Take 325 mg by mouth daily with breakfast.     gabapentin  (NEURONTIN ) 100 MG capsule Take 1 capsule (100 mg total) by mouth 3 (three) times daily. 90 capsule 3   methylPREDNISolone  (MEDROL  DOSEPAK) 4 MG TBPK tablet Take per package instructions 21 tablet 0   Omega-3 Fatty Acids (FISH OIL) 300 MG CAPS Fish Oil     omeprazole  (PRILOSEC) 40 MG capsule Take 1 capsule (40 mg total) by mouth daily. 90 capsule 3   spironolactone  (ALDACTONE ) 25 MG tablet Take 1 tablet (25 mg total) by mouth daily. 90 tablet 3   VYZULTA 0.024 % SOLN Place 0.024 % into both eyes at bedtime.     No facility-administered medications prior to visit.    Allergies[1]  ROS See HPI    Objective:    Physical Exam Constitutional:      General: She is not in acute distress.    Appearance: Normal appearance. She is well-developed.   HENT:     Head: Normocephalic and atraumatic.     Right Ear: External ear normal.     Left Ear: External ear normal.  Eyes:     General: No scleral icterus. Neck:     Thyroid: No thyromegaly.  Cardiovascular:     Rate and Rhythm: Normal rate and regular rhythm.  Heart sounds: Normal heart sounds. No murmur heard. Pulmonary:     Effort: Pulmonary effort is normal. No respiratory distress.     Breath sounds: Normal breath sounds. No wheezing.  Musculoskeletal:     Cervical back: Neck supple.  Skin:    General: Skin is warm and dry.  Neurological:     Mental Status: She is alert and oriented to person, place, and time.  Psychiatric:        Mood and Affect: Mood normal.        Behavior: Behavior normal.        Thought Content: Thought content normal.        Judgment: Judgment normal.      BP 139/82 (BP Location: Right Arm, Patient Position: Sitting, Cuff Size: Small)   Pulse 67   Temp 97.9 F (36.6 C) (Oral)   Resp 16   Ht 5' (1.524 m)   Wt 162 lb (73.5 kg)   LMP 02/24/2008   SpO2 99%   BMI 31.64 kg/m  Wt Readings from Last 3 Encounters:  04/11/24 162 lb (73.5 kg)  02/29/24 160 lb (72.6 kg)  12/07/23 155 lb 9.6 oz (70.6 kg)       Assessment & Plan:   Problem List Items Addressed This Visit       Unprioritized   Type 2 diabetes mellitus without complication, without long-term current use of insulin (HCC)   Reinforced DM diet.       Relevant Orders   Urine Microalbumin w/creat. ratio   HgB A1c   Primary insomnia   Generally sleeping well as long as she is not in pain.        Osteopenia   Increase caltrate to 600 mg bid. Dexa up to date.       Relevant Medications   Calcium  Carb-Cholecalciferol  (CALTRATE BONE HEALTH) 600-20 MG-MCG TABS   Lumbar radiculopathy   Improved with PT/steroids, continue gabapentin  prn.       Hyperlipidemia   Lab Results  Component Value Date   CHOL 173 12/07/2023   HDL 70.70 12/07/2023   LDLCALC 90 12/07/2023    TRIG 61.0 12/07/2023   CHOLHDL 2 12/07/2023   LDL goal <70, update lipids.  Continue atorvastatin . Consider increasing statin if still above goal.       Relevant Orders   Lipid panel   GERD (gastroesophageal reflux disease)   She reports that she has tried to wean off of omeprazole  but did not tolerate.  Continue omeprazole .      Fibromyalgia syndrome   She is managing with lidocaine  patch and TENS unit.       Essential hypertension - Primary   BP acceptable on amlodipine /aldactone , continue same.        I am having Dickey Silvan start on Calcium  Carb-Cholecalciferol . I am also having her maintain her ferrous sulfate, Combigan, Fish Oil, Vyzulta, amLODipine , atorvastatin , omeprazole , spironolactone , albuterol , methylPREDNISolone , and gabapentin .  Meds ordered this encounter  Medications   Calcium  Carb-Cholecalciferol  (CALTRATE BONE HEALTH) 600-20 MG-MCG TABS    Sig: Take 1 tablet by mouth in the morning and at bedtime.    Supervising Provider:   DOMENICA BLACKBIRD A [[id number]]      [1]  Allergies Allergen Reactions   Sulfa Antibiotics Other (See Comments)   Tylenol  [Acetaminophen ] Itching   "

## 2024-04-11 NOTE — Telephone Encounter (Signed)
 Please request DM eye exam from Dr. Maude Bring.

## 2024-04-11 NOTE — Assessment & Plan Note (Signed)
 Increase caltrate to 600 mg bid. Dexa up to date.

## 2024-04-11 NOTE — Assessment & Plan Note (Signed)
 She is managing with lidocaine  patch and TENS unit.

## 2024-04-11 NOTE — Patient Instructions (Signed)
" °  VISIT SUMMARY: Today, you came in for a follow-up visit to discuss your right hip and thigh pain, as well as to review your ongoing health conditions including fibromyalgia, hypertension, diabetes, high cholesterol, acid reflux, and osteopenia. We also discussed your general health maintenance.  YOUR PLAN: -LUMBAR RADICULOPATHY: Lumbar radiculopathy is a condition where a nerve in the lower back is pinched, causing pain in the hip and thigh. You should continue with your physical therapy sessions on the 7th and 9th of next month, use heat and the TENS unit as needed for pain relief, and discontinue gabapentin  if the pain resolves. If the pain worsens, we may consider an MRI.  -FIBROMYALGIA: Fibromyalgia is a condition that causes widespread pain. You should continue using the TENS unit and heat application for pain management, and use patches for localized pain as needed.  -TYPE 2 DIABETES MELLITUS: Type 2 diabetes is a condition where your body does not use insulin properly, leading to high blood sugar levels. We have ordered an updated A1c test to check your blood sugar levels. Continue with dietary modifications and monitor your blood glucose levels.  -ESSENTIAL HYPERTENSION: Essential hypertension is high blood pressure with no identifiable cause. Your blood pressure is well-controlled with your current medications. Continue taking amlodipine  and spironolactone  as prescribed.  -PURE HYPERCHOLESTEROLEMIA: Pure hypercholesterolemia is high cholesterol levels in the blood. Your LDL cholesterol is above the target level for diabetics. We have ordered an updated cholesterol panel and will increase your atorvastatin  to 20 mg daily if your LDL remains above 70 mg/dL.  -GASTROESOPHAGEAL REFLUX DISEASE (GERD): GERD is a condition where stomach acid frequently flows back into the tube connecting your mouth and stomach. Continue taking omeprazole  daily to manage your symptoms.  -OSTEOPENIA: Osteopenia is  a condition where bone mineral density is lower than normal. Continue taking Caltrate 600 mg twice daily for calcium  supplementation.  -GENERAL HEALTH MAINTENANCE: You declined the flu shot this season, but you are due for a pneumonia booster. Continue with regular ophthalmology follow-ups for glaucoma monitoring.  INSTRUCTIONS: Please schedule a nurse visit for your flu shot and Prevnar 20 booster. Continue with your regular ophthalmology follow-ups for glaucoma monitoring.                     "

## 2024-04-11 NOTE — Therapy (Signed)
 " OUTPATIENT PHYSICAL THERAPY THORACOLUMBAR TREATMENT   Patient Name: Courtney Guzman MRN: [id number] DOB:19-Aug-1960, 63 y.o., female Today's Date: 04/11/2024  END OF SESSION:  PT End of Session - 04/11/24 0856     Visit Number 3    Date for Recertification  05/24/24    PT Start Time 0845    PT Stop Time 0933    PT Time Calculation (min) 48 min    Activity Tolerance Patient tolerated treatment well    Behavior During Therapy [facility] for tasks assessed/performed           Past Medical History:  Diagnosis Date   Allergy    seasonal   Arthritis    Diabetes mellitus without complication (HCC)    Fibromyalgia    dr. eudelia   GERD (gastroesophageal reflux disease)    Hyperlipidemia    Hypertension    Wheezes    with season changes    Past Surgical History:  Procedure Laterality Date   CESAREAN SECTION  1993   with BTL   COLONOSCOPY  2017   Dr.Armbruster   COLONOSCOPY W/ POLYPECTOMY  2006   BENIGN ADENOMA    POLYPECTOMY     TONSILLECTOMY AND ADENOIDECTOMY     Patient Active Problem List   Diagnosis Date Noted   Lumbar radiculopathy 02/29/2024   Osteoarthritis 10/30/2018   Iron deficiency anemia 10/30/2018   Essential hypertension 08/11/2016   Primary insomnia 07/01/2016   Type 2 diabetes mellitus without complication, without long-term current use of insulin (HCC) 06/14/2015   Hyperlipidemia 05/10/2014   Osteopenia 03/22/2012   GERD (gastroesophageal reflux disease)    Fibromyalgia syndrome 07/26/2008    PCP: Jason Leita Repine, FNP (Inactive)   REFERRING PROVIDER: Daryl Setter, NP   REFERRING DIAG: M54.16 (ICD-10-CM) - Lumbar radiculopathy  THERAPY DIAG:  Other low back pain  Muscle weakness (generalized)  Abnormal posture  Difficulty in walking, not elsewhere classified  RATIONALE FOR EVALUATION AND TREATMENT: Rehabilitation  ONSET DATE: 02/26/24  NEXT MD VISIT:    SUBJECTIVE:                                                                                                                                                                                                          SUBJECTIVE STATEMENT: Pt reports no pain, no radiating pain to anterior thigh.     63 y/o patient referred to PT from PCP for R hip and thigh pain.   Patient states was sitting in church on 11/9 and started having R lateral lower back pain with radiation into the  anterior R thigh for no apparent reason.  Pain did not get any better when she stood up.   She went the following week to PCP and got tapered dose prednisone and meloxicam that has not provided any relief.  She was then started on neurontin  and has seen some improvement.   Pain has been fairly constant, but is somewhat improved since initial onset.  The pain is worse now in the anterior R thigh than it is in the R low back.   Initially she was unable to lie on her stomach to sleep which she normally does.   Rolling to the R ( L lumbar rotation) was extremely painful for her and she could only do R rotation lying on the L side.   That has improved some.    Patient was very active prior to this pain started walking 4 days weekly, doing aerobics (some step and some flat).   She tries to keep fairly active to manage her fibromyalgia.   She has been unable to do this since the injury due to the pain which is very frustrating.  She uses a TENS unit and cervical home traction unit for her cervical fibromyalgia pain.  PAIN: Are you having pain? Yes: NPRS scale: 0/10 now;  6/10 worst Pain location: R anterior thigh Pain description: dull achy, occasional burning sensation Aggravating factors: leaning back Relieving factors: leaning over to the R or lying on her R side  PERTINENT HISTORY:  DM, fibromyalgia, OA, HTN, osteopenia  PRECAUTIONS: None  RED FLAGS: None  WEIGHT BEARING RESTRICTIONS: No  FALLS:  Has patient fallen in last 6 months? No  LIVING ENVIRONMENT: Lives with: lives with their family  and lives with their spouse Lives in: House/apartment Stairs: Yes: Internal: 1 flight steps; on right going up Has following equipment at home: None  OCCUPATION: retired from [employer] and was up and down a lot, but no heavy lifting  PLOF: Independent with gait  PATIENT GOALS: to return to her normal activity without pain   OBJECTIVE: (objective measures completed at initial evaluation unless otherwise dated)  DIAGNOSTIC FINDINGS:  N/A  PATIENT SURVEYS:  Modified Oswestry:  MODIFIED OSWESTRY DISABILITY SCALE  Date: 03/29/24 Score  Pain intensity 0 = I can tolerate the pain I have without having to use pain medication.  2. Personal care (washing, dressing, etc.) 0 =  I can take care of myself normally without causing increased pain.  3. Lifting 3 = Pain prevents me from lifting heavy weights, but I can manage light to medium weights if they are conveniently positioned  4. Walking 1 = Pain prevents me from walking more than 1 mile.  5. Sitting 1 =  I can only sit in my favorite chair as long as I like.  6. Standing 1 =  I can stand as long as I want but, it increases my pain.  7. Sleeping 2 =  Even when I take pain medication, I sleep less than 6 hours  8. Social Life 2 = Pain prevents me from participating in more energetic activities (eg. sports, dancing).  9. Traveling 1 =  I can travel anywhere, but it increases my pain.  10. Employment/ Homemaking 1 = My normal homemaking/job activities increase my pain, but I can still perform all that is required of me  Total 12/50   Interpretation of scores: Score Category Description  0-20% Minimal Disability The patient can cope with most living activities. Usually no treatment is  indicated apart from advice on lifting, sitting and exercise  21-40% Moderate Disability The patient experiences more pain and difficulty with sitting, lifting and standing. Travel and social life are more difficult and they may be disabled  from work. Personal care, sexual activity and sleeping are not grossly affected, and the patient can usually be managed by conservative means  41-60% Severe Disability Pain remains the main problem in this group, but activities of daily living are affected. These patients require a detailed investigation  61-80% Crippled Back pain impinges on all aspects of the patients life. Positive intervention is required  81-100% Bed-bound These patients are either bed-bound or exaggerating their symptoms  Bluford FORBES Zoe DELENA Karon DELENA, et al. Surgery versus conservative management of stable thoracolumbar fracture: the PRESTO feasibility RCT. [place] ([place]): [employer]; 2021 Nov. [facility] Technology Assessment, No. 25.62.) Appendix 3, Oswestry Disability Index category descriptors. Available from: Findjewelers.cz  Minimally Clinically Important Difference (MCID) = 12.8%  SCREENING FOR RED FLAGS: Bowel or bladder incontinence: No Spinal tumors: No Cauda equina syndrome: No Compression fracture: No Abdominal aneurysm: No  COGNITION:  Overall cognitive status: Within functional limits for tasks assessed    SENSATION: Abnormal sensations over the R anterior thigh  POSTURE:  Increased anterior pelvic tilt, R lateral shift, leans to the R with forward and backward flexion ROM testing  PALPATION: TTP over the R lower back and R anterior thigh  LUMBAR ROM:   Active  Eval  Flexion To knees, no worse  Extension 75%, some increase  Right lateral flexion Below knee  Left lateral flexion Below knee, increased pain  Right rotation 100%, increased pain  Left rotation 100%   (Blank rows = not tested)  MUSCLE LENGTH: Hamstrings: Right SLR = 75 deg; Left SLR =80  deg Thomas test: Right knee flexion = 100 deg; Left NT deg Hamstrings: mild tight BLE ITB: not tight Piriformis: not tight Hip flexors: not tight Quads: WNL Heelcord: NT  LOWER EXTREMITY ROM:      Active  Right eval Left eval  Hip flexion    Hip extension    Hip abduction    Hip adduction    Hip internal rotation    Hip external rotation    Knee flexion    Knee extension    Ankle dorsiflexion    Ankle plantarflexion    Ankle inversion    Ankle eversion    (Blank rows = not tested)  LOWER EXTREMITY MMT:    MMT Right eval Left eval  Hip flexion    Hip extension    Hip abduction    Hip adduction    Hip internal rotation    Hip external rotation    Knee flexion    Knee extension    Ankle dorsiflexion    Ankle plantarflexion    Ankle inversion    Ankle eversion     (Blank rows = not tested)  LUMBAR SPECIAL TESTS:  Straight leg raise test: + on contralateral side only, Slump test: Positive, SI Compression/distraction test: Negative, and FABER test: Negative L SLR increases R LBP and and R thigh paresthesias Repeated flexion no change, repeated extension is worse, repeated L SB is worse, Repeated R SB is no change to symptoms SKTC on the LLE increases pain SI tests are non provoking  FUNCTIONAL TESTS:  TBD  GAIT: Distance walked: into clinic x 150' Assistive device utilized: None Level of assistance: Complete Independence Gait pattern: R lateral shift/lean Comments:    TODAY'S  TREATMENT:  04/11/24 Nustep L4x79min UE/LE Prone hip extension BLE 2 x 10  Bridges GTB hip ABD 2x10 Hooklying marching GTB x 10 BLE S/L clamshell GTB 2 x 10 B S/L B hip abduction 2x10- blocked from rolling onto side performing RLE Standing hip abduction GTB at thighs x 10 BLE Standing hip extension GTB at thighs x 10 BLE  04/05/24 Therapeutic Exercise:  Nustep L2x59min UE/LE Standing trunk rotation holding onto ladder x 10 B R lateral Shift correction at wall  R shld abd and lateral shift at wall Hooklying LTR going to L 2 x 10; x 10 R side Reviewed medbridge go app with patient, show how to use it and access home program  Neuromuscular Re-Ed: Standing hip extension x 10  B- UE support Standing hip abduction x 10 B- UE support Hooklying TRA sets 10x3 sec; 2 sets Bridges 2x10 Prone R knee bend 2x10 Seated clamshells GTB x 20 S/L clamshells RTB x 10 BLE R long leg distraction 3x with oscillations - to reduce radicular symptoms     SELF CARE: Provided education on PT POC progression., initil HEP--advised to only rock knees to the left with LTR HEP    PATIENT EDUCATION:  Education details: HEP update, reviewed medbridge app with patient Person educated: Patient Education method: Explanation, Demonstration, Verbal cues, Tactile cues, Handouts, and MedBridgeGO app access provided Education comprehension: verbalized understanding, verbal cues required, tactile cues required, and needs further education  HOME EXERCISE PROGRAM: Access Code: [email or web address] URL: [email or web address] Date: 04/11/2024 Prepared by: Lymon Kidney  Exercises - Supine Lower Trunk Rotation  - 1 x daily - 7 x weekly - 1 sets - 10 reps - Right Standing Lateral Shift Correction at Wall - Hold  - 1 x daily - 7 x weekly - 1 sets - 10 reps - Standing Shoulder Abduction Slides at Wall  - 1 x daily - 7 x weekly - 1 sets - 10 reps - Supine Bridge  - 1 x daily - 7 x weekly - 3 sets - 10 reps - Clamshell with Resistance  - 1 x daily - 7 x weekly - 3 sets - 10 reps - Sidelying Hip Abduction  - 1 x daily - 7 x weekly - 3 sets - 10 reps - Prone Hip Extension  - 1 x daily - 7 x weekly - 3 sets - 10 reps   ASSESSMENT:  CLINICAL IMPRESSION: Pt reports that she is doing well. Continued progressing interventions as tolerated, mostly focused on core strength and hip strength. Pain continues to improve but patient feels that it is nerve pain medication combined with exercise.  Courtney Guzman is a 63 y.o. female who was referred to physical therapy for evaluation and treatment for M54.16 (ICD-10-CM) - Lumbar radiculopathy.     Patient reports onset of low back and R anterior thigh pain  beginning 02/26/24 for no apparent reason while sitting in church.  She is feeling better now than she initially did, is having more pain in the R anterior thigh than she originally did and less in the back.   Pain is worse with back extension and leaning to the L, so she leans to the R to decrease the pain.  Patient has deficits in lumbar ROM, BLE strength, abnormal posture, difficulty walking, and pain which are interfering with ADLs and are impacting quality of life.  On Modified Oswestry patient scored 12/50 demonstrating 24% or mild disability.  Avianah will benefit from skilled PT to address above deficits to  improve mobility and activity tolerance with decreased pain interference.     OBJECTIVE IMPAIRMENTS: difficulty walking, decreased ROM, decreased strength, impaired sensation, postural dysfunction, and pain.   ACTIVITY LIMITATIONS: carrying, lifting, bending, sleeping, and locomotion level  PARTICIPATION LIMITATIONS: cleaning, laundry, and community activity  PERSONAL FACTORS: Age, Fitness, and 1-2 comorbidities: DM, fibromyalgia, HTN are also affecting patient's functional outcome.   REHAB POTENTIAL: Good  CLINICAL DECISION MAKING: Evolving/moderate complexity  EVALUATION COMPLEXITY: Moderate   GOALS: Goals reviewed with patient? Yes  SHORT TERM GOALS: Target date: 04/26/2024   Patient will be independent with initial HEP to improve outcomes and carryover.  Baseline: 100% PT assist required for correct completion Goal status: IN PROGRESS- 04/11/24 cues required  2.  Patient will report 25% improvement in low back pain to improve QOL. Baseline:  Goal status: INITIAL   LONG TERM GOALS: Target date: 05/24/2024   Patient will be independent with ongoing/advanced HEP for self-management at home.  Baseline: no advanced HEP yet Goal status: INITIAL  2.  Patient will report 50-75% improvement in low back pain to improve QOL.  Baseline: 6/10 Goal status: INITIAL  3.  Patient to  demonstrate ability to achieve and maintain good spinal alignment/posturing and body mechanics needed for daily activities. Baseline: R lateral shift Goal status: INITIAL  4.  Patient will demonstrate full pain free lumbar ROM to perform ADLs.   Baseline: Refer to above lumbar ROM table Goal status: INITIAL  5.  Patient will demonstrate improved BLE strength to >/= 5/5 for improved stability and ease of mobility. Baseline: Refer to above LE MMT table Goal status: INITIAL  6. Patient will report </= 12% on Modified Oswestry (MCID = 12%) to demonstrate improved functional ability with decreased pain interference. Baseline: 24% Goal status: INITIAL  7.  Patient will tolerate 30 min of (standing/sitting/walking) w/o increased pain to allow for  improved mobility and activity tolerance. Baseline: can do it , but is in pain Goal status: INITIAL  8.  Patient will report centralization of radicular symptoms.  Baseline: anterior thigh paresthesia Goal status: INITIAL   PLAN:  PT FREQUENCY: 1-2x/week  PT DURATION: 8 weeks  PLANNED INTERVENTIONS: 97164- PT Re-evaluation, 97110-Therapeutic exercises, 97530- Therapeutic activity, 97112- Neuromuscular re-education, 97535- Self Care, 02859- Manual therapy, G0283- Electrical stimulation (unattended), 97016- Vasopneumatic device, [id number]- Ultrasound, [id number]- Traction (mechanical), [id number]- Ionotophoresis 4mg /ml Dexamethasone, Patient/Family education, Taping, Joint mobilization, and Spinal mobilization  PLAN FOR NEXT SESSION: might try some extension with R pelvis sidegliding, consider traction either w/ sheet manually or machine, bridging, R trunk rotation   Sol LITTIE Gaskins, PTA 04/11/2024, 9:33 AM  "

## 2024-04-11 NOTE — Assessment & Plan Note (Signed)
 Reinforced DM diet.

## 2024-04-12 ENCOUNTER — Telehealth: Payer: Self-pay | Admitting: Family

## 2024-04-12 DIAGNOSIS — E7800 Pure hypercholesterolemia, unspecified: Secondary | ICD-10-CM

## 2024-04-12 LAB — LIPID PANEL
Cholesterol: 191 mg/dL
HDL: 83 mg/dL
LDL Cholesterol (Calc): 91 mg/dL
Non-HDL Cholesterol (Calc): 108 mg/dL
Total CHOL/HDL Ratio: 2.3 (calc)
Triglycerides: 77 mg/dL

## 2024-04-12 MED ORDER — ATORVASTATIN CALCIUM 20 MG PO TABS: 20.0000 mg | ORAL_TABLET | Freq: Every day | ORAL | 1 refills | Status: AC

## 2024-04-12 NOTE — Telephone Encounter (Signed)
 LMOM asking for call back. Okay to relay on call back.

## 2024-04-12 NOTE — Telephone Encounter (Signed)
 A1C is stable.  I would like to have her increase her atorvastatin  from 10 mg to 20 mg as her bad cholesterol is above goal.

## 2024-04-12 NOTE — Telephone Encounter (Signed)
 Copied from CRM #[id number]. Topic: Clinical - Lab/Test Results >> Apr 12, 2024 10:47 AM Adelita E wrote: Reason for CRM: Patient returning call for lab results. Relayed note that PCP attached with results and patient did not have any further questions.

## 2024-04-18 NOTE — Progress Notes (Unsigned)
 "  Courtney Guzman - 63 y.o. female MRN [id number]  Date of birth: Feb 22, 1961  Office Visit Note: Visit Date: 04/19/2024 PCP: Daryl Setter, NP Referred by: No ref. provider found  Subjective: No chief complaint on file.  HPI: Courtney Guzman is a pleasant 63 y.o. female who presents today for right wrist ulnar-sided pain that has been present now for roughly 1 month.  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  Has not undergone any significant workup or treatment for the wrist pain.  Denies any major injury.  Has been wearing a wrist brace intermittently with minimal relief.  Has also trialed nonsteroidal anti-inflammatory.  Pertinent ROS were reviewed with the patient and found to be negative unless otherwise specified above in HPI.   Visit Reason: Right ulnar-sided wrist pain Duration of symptoms: 1 month Hand dominance: right Occupation: Retired Diabetic: Yes /6.4 Smoking: No Heart/Lung History: None Blood Thinners: None     Assessment & Plan: Visit Diagnoses:  1. Pain in right wrist     Plan: Extensive discussion was had the patient today regarding her right ulnar-sided wrist pain.  She does have some tenderness at the foveal region and pain with rotation of the wrist and ulnar deviation.  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  She does not have any gross instability of the wrist on examination today.  X-rays obtained do not show any significant bony abnormalities.  From a treatment standpoint, we discussed conservative versus surgical treatment options.  From a conservative standpoint, we discussed wrist bracing, activity modification, nonsteroidal anti-inflammatories both topical and oral and finally cortisone injections.  I also explained that we could have her see occupational therapy.  From a surgical standpoint, we discussed potential MRI study and possible arthroscopic  surgery and debridement versus repair should we see true TFCC pathology.  For the time being, understanding her options, she would like to begin with cortisone injection and continue with wrist bracing which is appropriate.  I spent 30 minutes in the care of this patient today including review of previous documentation, imaging obtained, face-to-face time discussing all options regarding treatment and documenting the encounter.   Follow-up: No follow-ups on file.   Meds & Orders: No orders of the defined types were placed in this encounter.   Orders Placed This Encounter  Procedures   XR Wrist Complete Right     Procedures: Medium Joint Inj: R radiocarpal on 04/19/2024 8:22 AM Indications: pain Details: 25 G needle Medications: 1 mL lidocaine  1 %; 6 mg betamethasone acetate-betamethasone sodium phosphate 6 (3-3) MG/ML Outcome: tolerated well, no immediate complications Procedure, treatment alternatives, risks and benefits explained, specific risks discussed.          Clinical History: No specialty comments available.  She reports that she has never smoked. She has never been exposed to tobacco smoke. She has never used smokeless tobacco.  Recent Labs    12/07/23 0921 04/11/24 1525  HGBA1C 6.7* 6.4    Objective:   Vital Signs: LMP 02/24/2008   Physical Exam  Gen: Well-appearing, in no acute distress; non-toxic CV: Regular Rate. Well-perfused. Warm.  Resp: Breathing unlabored on room air; no wheezing. Psych: Fluid speech in conversation; appropriate affect; normal thought process  Ortho Exam Right wrist: - Positive foveal tenderness, pain with ulnar deviation, no DRUJ instability - Full composite fist without restriction, wrist range of motion flexion/extension 65/55, sensation intact  in all distributions, hand remains warm well-perfused   Imaging: XR Wrist Complete Right Result Date: 04/19/2024 There is no evidence of fracture or dislocation. There is no evidence  of arthropathy or other focal bone abnormality. Soft tissues are unremarkable.    Past Medical/Family/Surgical/Social History: Medications & Allergies reviewed per EMR, new medications updated. Patient Active Problem List   Diagnosis Date Noted   Lumbar radiculopathy 02/29/2024   Osteoarthritis 10/30/2018   Iron deficiency anemia 10/30/2018   Essential hypertension 08/11/2016   Primary insomnia 07/01/2016   Type 2 diabetes mellitus without complication, without long-term current use of insulin (HCC) 06/14/2015   Hyperlipidemia 05/10/2014   Osteopenia 03/22/2012   GERD (gastroesophageal reflux disease)    Fibromyalgia syndrome 07/26/2008   Past Medical History:  Diagnosis Date   Allergy    seasonal   Arthritis    Diabetes mellitus without complication (HCC)    Fibromyalgia    dr. eudelia   GERD (gastroesophageal reflux disease)    Hyperlipidemia    Hypertension    Wheezes    with season changes    Family History  Problem Relation Age of Onset   Colon cancer Mother    Hypertension Mother    Kidney cancer Mother    Diabetes Sister    Cancer Maternal Grandmother        OVARIAN   Ovarian cancer Maternal Grandmother    Colon polyps Neg Hx    Esophageal cancer Neg Hx    Rectal cancer Neg Hx    Stomach cancer Neg Hx    Past Surgical History:  Procedure Laterality Date   CESAREAN SECTION  1993   with BTL   COLONOSCOPY  2017   Dr.Armbruster   COLONOSCOPY W/ POLYPECTOMY  2006   BENIGN ADENOMA    POLYPECTOMY     TONSILLECTOMY AND ADENOIDECTOMY     Social History   Occupational History   Not on file  Tobacco Use   Smoking status: Never    Passive exposure: Never   Smokeless tobacco: Never  Vaping Use   Vaping status: Never Used  Substance and Sexual Activity   Alcohol use: No    Alcohol/week: 0.0 standard drinks of alcohol   Drug use: No   Sexual activity: Yes    Partners: Male    Birth control/protection: Surgical    Comment: TUBAL LIGATION, 1st sexual  encounter at 77, has had 5     Robinn Overholt Estela) Sajjad Honea, M.D. [facility], Hand Surgery  "

## 2024-04-19 ENCOUNTER — Other Ambulatory Visit

## 2024-04-19 ENCOUNTER — Ambulatory Visit: Admitting: Orthopedic Surgery

## 2024-04-19 DIAGNOSIS — M25531 Pain in right wrist: Secondary | ICD-10-CM

## 2024-04-19 MED ORDER — LIDOCAINE HCL 1 % IJ SOLN
1.0000 mL | INTRAMUSCULAR | Status: AC | PRN
  Administered 2024-04-19: 1 mL

## 2024-04-19 MED ORDER — BETAMETHASONE SOD PHOS & ACET 6 (3-3) MG/ML IJ SUSP
6.0000 mg | INTRAMUSCULAR | Status: AC | PRN
  Administered 2024-04-19: 6 mg via INTRA_ARTICULAR

## 2024-04-26 ENCOUNTER — Ambulatory Visit

## 2024-04-28 ENCOUNTER — Ambulatory Visit (INDEPENDENT_AMBULATORY_CARE_PROVIDER_SITE_OTHER): Admitting: *Deleted

## 2024-04-28 ENCOUNTER — Ambulatory Visit: Admitting: Rehabilitation

## 2024-04-28 ENCOUNTER — Encounter: Payer: Self-pay | Admitting: Family

## 2024-04-28 DIAGNOSIS — Z23 Encounter for immunization: Secondary | ICD-10-CM

## 2024-04-28 NOTE — Progress Notes (Signed)
 Patient here for Prevnar 20 and flu vaccines per pcp.  Prevnar vaccine given in right deltoid and flu vaccine given in left deltoid.  Patient tolerated both vaccines well.

## 2024-05-09 ENCOUNTER — Encounter: Payer: Self-pay | Admitting: Family

## 2024-05-09 DIAGNOSIS — Z0184 Encounter for antibody response examination: Secondary | ICD-10-CM

## 2024-05-10 ENCOUNTER — Ambulatory Visit: Admitting: *Deleted

## 2024-05-10 ENCOUNTER — Other Ambulatory Visit (INDEPENDENT_AMBULATORY_CARE_PROVIDER_SITE_OTHER)

## 2024-05-10 DIAGNOSIS — Z0184 Encounter for antibody response examination: Secondary | ICD-10-CM

## 2024-05-10 DIAGNOSIS — Z111 Encounter for screening for respiratory tuberculosis: Secondary | ICD-10-CM

## 2024-05-10 NOTE — Progress Notes (Cosign Needed Addendum)
 PPD Placement note Courtney Guzman, 64 y.o. female is here today for placement of PPD test Reason for PPD test: work Pt taken PPD test before: yes Verified in allergy area and with patient that they are not allergic to the products PPD is made of (Phenol or Tween). Yes Is patient taking any oral or IV steroid medication now or have they taken it in the last month? yes Has the patient ever received the BCG vaccine?: no Has the patient been in recent contact with anyone known or suspected of having active TB disease?: no      Patient's Country of origin?: [place]  O: Alert and oriented in NAD. P:  PPD placed on 05/10/2024 in right lower  forearm.  Patient advised to return for reading within 48-72 hours.

## 2024-05-10 NOTE — Telephone Encounter (Signed)
 Patient prefers skin test, will come in today at 2 pm for NV TB skin test placement and back on Friday for reading

## 2024-05-10 NOTE — Telephone Encounter (Signed)
 We can either do PPD or TB gold depending on pt preference.

## 2024-05-12 ENCOUNTER — Ambulatory Visit: Payer: Self-pay | Admitting: Family

## 2024-05-12 ENCOUNTER — Ambulatory Visit

## 2024-05-12 LAB — MEASLES/MUMPS/RUBELLA IMMUNITY
Mumps IgG: 202 [AU]/ml
Rubella: 2.32 {index}
Rubeola IgG: >300 [AU]/ml

## 2024-05-12 LAB — TB SKIN TEST
Induration: 0 mm
TB Skin Test: NEGATIVE

## 2024-05-12 LAB — HEPATITIS B SURFACE ANTIBODY, QUANTITATIVE: Hep B S AB Quant (Post): 76 m[IU]/mL

## 2024-05-12 NOTE — Progress Notes (Signed)
"  PPD Reading Note  PPD read and results entered in EpicCare.  Result: 0 mm induration.  Interpretation: NEGATIVE  If test not read within 48-72 hours of initial placement, patient advised to repeat in other arm 1-3 weeks after this test.  Allergic reaction: no  "

## 2024-05-31 ENCOUNTER — Ambulatory Visit: Admitting: Orthopedic Surgery

## 2024-08-11 ENCOUNTER — Ambulatory Visit: Admitting: Family

## 2024-08-11 ENCOUNTER — Ambulatory Visit: Admitting: Medical

## 2024-12-08 ENCOUNTER — Encounter: Admitting: Family
